# Patient Record
Sex: Male | Born: 1945
Health system: Southern US, Community
[De-identification: ages and names within clinical notes are randomized; demographics above are authoritative.]

## PROBLEM LIST (undated history)

## (undated) DIAGNOSIS — N289 Disorder of kidney and ureter, unspecified: Secondary | ICD-10-CM

## (undated) DIAGNOSIS — E119 Type 2 diabetes mellitus without complications: Secondary | ICD-10-CM

## (undated) DIAGNOSIS — M1990 Unspecified osteoarthritis, unspecified site: Secondary | ICD-10-CM

## (undated) DIAGNOSIS — I10 Essential (primary) hypertension: Secondary | ICD-10-CM

## (undated) DIAGNOSIS — I509 Heart failure, unspecified: Secondary | ICD-10-CM

## (undated) DIAGNOSIS — F0390 Unspecified dementia without behavioral disturbance: Secondary | ICD-10-CM

## (undated) HISTORY — PX: BACK SURGERY: SHX140

---

## 1965-05-30 HISTORY — PX: NEPHRECTOMY: SHX65

## 1998-02-17 ENCOUNTER — Encounter: Payer: Self-pay | Admitting: Family Medicine

## 1998-02-17 ENCOUNTER — Ambulatory Visit (HOSPITAL_COMMUNITY): Admission: RE | Admit: 1998-02-17 | Discharge: 1998-02-17 | Payer: Self-pay | Admitting: Family Medicine

## 1998-02-23 ENCOUNTER — Encounter: Payer: Self-pay | Admitting: Neurosurgery

## 1998-02-23 ENCOUNTER — Inpatient Hospital Stay (HOSPITAL_COMMUNITY): Admission: RE | Admit: 1998-02-23 | Discharge: 1998-02-24 | Payer: Self-pay | Admitting: Neurosurgery

## 1998-03-19 ENCOUNTER — Encounter: Payer: Self-pay | Admitting: Neurosurgery

## 1998-03-19 ENCOUNTER — Ambulatory Visit (HOSPITAL_COMMUNITY): Admission: RE | Admit: 1998-03-19 | Discharge: 1998-03-19 | Payer: Self-pay | Admitting: Neurosurgery

## 1999-11-01 ENCOUNTER — Encounter (INDEPENDENT_AMBULATORY_CARE_PROVIDER_SITE_OTHER): Payer: Self-pay | Admitting: Specialist

## 1999-11-01 ENCOUNTER — Ambulatory Visit (HOSPITAL_COMMUNITY): Admission: RE | Admit: 1999-11-01 | Discharge: 1999-11-01 | Payer: Self-pay | Admitting: Gastroenterology

## 2004-01-05 ENCOUNTER — Encounter (INDEPENDENT_AMBULATORY_CARE_PROVIDER_SITE_OTHER): Payer: Self-pay | Admitting: *Deleted

## 2004-01-05 ENCOUNTER — Ambulatory Visit (HOSPITAL_COMMUNITY): Admission: RE | Admit: 2004-01-05 | Discharge: 2004-01-05 | Payer: Self-pay | Admitting: Gastroenterology

## 2009-07-16 ENCOUNTER — Encounter (INDEPENDENT_AMBULATORY_CARE_PROVIDER_SITE_OTHER): Payer: Self-pay | Admitting: Internal Medicine

## 2009-07-16 ENCOUNTER — Observation Stay (HOSPITAL_COMMUNITY): Admission: EM | Admit: 2009-07-16 | Discharge: 2009-07-18 | Payer: Self-pay | Admitting: Emergency Medicine

## 2010-08-19 LAB — CBC
Hemoglobin: 11.6 g/dL — ABNORMAL LOW (ref 13.0–17.0)
Hemoglobin: 14 g/dL (ref 13.0–17.0)
MCHC: 32.9 g/dL (ref 30.0–36.0)
MCHC: 33.7 g/dL (ref 30.0–36.0)
Platelets: 198 10*3/uL (ref 150–400)
Platelets: 238 10*3/uL (ref 150–400)
RBC: 4.02 MIL/uL — ABNORMAL LOW (ref 4.22–5.81)
RDW: 13.9 % (ref 11.5–15.5)
RDW: 14.2 % (ref 11.5–15.5)

## 2010-08-19 LAB — COMPREHENSIVE METABOLIC PANEL
ALT: 18 U/L (ref 0–53)
AST: 21 U/L (ref 0–37)
Albumin: 3 g/dL — ABNORMAL LOW (ref 3.5–5.2)
Albumin: 3.8 g/dL (ref 3.5–5.2)
BUN: 25 mg/dL — ABNORMAL HIGH (ref 6–23)
BUN: 32 mg/dL — ABNORMAL HIGH (ref 6–23)
Calcium: 8 mg/dL — ABNORMAL LOW (ref 8.4–10.5)
Calcium: 9.4 mg/dL (ref 8.4–10.5)
Creatinine, Ser: 1.62 mg/dL — ABNORMAL HIGH (ref 0.4–1.5)
GFR calc Af Amer: 52 mL/min — ABNORMAL LOW (ref >60)
Glucose, Bld: 212 mg/dL — ABNORMAL HIGH (ref 70–99)
Sodium: 139 mEq/L (ref 135–145)
Total Bilirubin: 0.6 mg/dL (ref 0.3–1.2)
Total Protein: 5.6 g/dL — ABNORMAL LOW (ref 6.0–8.3)
Total Protein: 7.2 g/dL (ref 6.0–8.3)

## 2010-08-19 LAB — GLUCOSE, CAPILLARY
Glucose-Capillary: 104 mg/dL — ABNORMAL HIGH (ref 70–99)
Glucose-Capillary: 89 mg/dL (ref 70–99)

## 2010-08-19 LAB — DIFFERENTIAL
Lymphs Abs: 0.5 10*3/uL — ABNORMAL LOW (ref 0.7–4.0)
Monocytes Absolute: 0.9 10*3/uL (ref 0.1–1.0)
Monocytes Relative: 9 % (ref 3–12)
Neutro Abs: 8.8 10*3/uL — ABNORMAL HIGH (ref 1.7–7.7)
Neutrophils Relative %: 85 % — ABNORMAL HIGH (ref 43–77)

## 2010-08-19 LAB — HEMOGLOBIN A1C
Hgb A1c MFr Bld: 6.4 % — ABNORMAL HIGH (ref 4.6–6.1)
Mean Plasma Glucose: 137 mg/dL

## 2010-08-19 LAB — CK TOTAL AND CKMB (NOT AT ARMC)
CK, MB: 1.7 ng/mL (ref 0.3–4.0)
CK, MB: 2.2 ng/mL (ref 0.3–4.0)
Relative Index: 2 (ref 0.0–2.5)
Total CK: 109 U/L (ref 7–232)
Total CK: 94 U/L (ref 7–232)

## 2010-08-19 LAB — POCT I-STAT, CHEM 8
BUN: 34 mg/dL — ABNORMAL HIGH (ref 6–23)
Chloride: 111 mEq/L (ref 96–112)
Creatinine, Ser: 2.2 mg/dL — ABNORMAL HIGH (ref 0.4–1.5)
Sodium: 141 mEq/L (ref 135–145)

## 2010-08-19 LAB — URINALYSIS, ROUTINE W REFLEX MICROSCOPIC
Glucose, UA: NEGATIVE mg/dL
Hgb urine dipstick: NEGATIVE
Ketones, ur: NEGATIVE mg/dL
Leukocytes, UA: NEGATIVE
Nitrite: NEGATIVE
Protein, ur: 100 mg/dL — AB
Specific Gravity, Urine: 1.017 (ref 1.005–1.030)
Urobilinogen, UA: 0.2 mg/dL (ref 0.0–1.0)
pH: 5.5 (ref 5.0–8.0)

## 2010-08-19 LAB — CARDIAC PANEL(CRET KIN+CKTOT+MB+TROPI)
CK, MB: 1.3 ng/mL (ref 0.3–4.0)
Total CK: 92 U/L (ref 7–232)

## 2010-08-19 LAB — LACTIC ACID, PLASMA: Lactic Acid, Venous: 2.7 mmol/L — ABNORMAL HIGH (ref 0.5–2.2)

## 2010-08-19 LAB — URINE CULTURE: Colony Count: 10000

## 2010-08-19 LAB — BASIC METABOLIC PANEL
BUN: 16 mg/dL (ref 6–23)
BUN: 38 mg/dL — ABNORMAL HIGH (ref 6–23)
Calcium: 8.4 mg/dL (ref 8.4–10.5)
Creatinine, Ser: 1.34 mg/dL (ref 0.4–1.5)
GFR calc non Af Amer: 33 mL/min — ABNORMAL LOW (ref >60)
GFR calc non Af Amer: 54 mL/min — ABNORMAL LOW (ref >60)
Glucose, Bld: 109 mg/dL — ABNORMAL HIGH (ref 70–99)
Glucose, Bld: 129 mg/dL — ABNORMAL HIGH (ref 70–99)
Potassium: 4.1 mEq/L (ref 3.5–5.1)
Sodium: 140 mEq/L (ref 135–145)

## 2010-08-19 LAB — STOOL CULTURE

## 2010-08-19 LAB — CLOSTRIDIUM DIFFICILE EIA

## 2010-08-19 LAB — POCT CARDIAC MARKERS
CKMB, poc: 3 ng/mL (ref 1.0–8.0)
Myoglobin, poc: 459 ng/mL (ref 12–200)
Troponin i, poc: <0.05 ng/mL (ref 0.00–0.09)

## 2010-08-19 LAB — URINE MICROSCOPIC-ADD ON

## 2010-08-19 LAB — LIPID PANEL: HDL: 29 mg/dL — ABNORMAL LOW (ref >39)

## 2010-08-19 LAB — PROTIME-INR: INR: 0.98 (ref 0.00–1.49)

## 2010-10-15 NOTE — Procedures (Signed)
[place]  Patient:    Fernando [name], Fernando [name]                    MRN: [id number] Proc. Date: 11/01/99 Adm. Date:  [id number] Attending:  Nelda Marseille CC:         Desma Maxim, M.D.                           Procedure Report  PROCEDURE:  Colonoscopy with hot biopsy.  INDICATIONS:  A family history of colon cancer, due for colonic screening. Consent was signed after the risks, benefits, methods, and options were discussed before any sedation was given.  MEDICINES USED:  Demerol 100 mg and Versed 2.5 mg.  DESCRIPTION OF PROCEDURE:  Rectal inspection was pertinent for external hemorrhoids.  Digital exam was negative.  The video colonoscope was inserted and fairly easily advanced around the colon to the cecum.  This did require rolling him on his back and some abdominal pressure.  On insertion, no abnormalities were seen.  The cecum was identified by the appendiceal orifice and the ileocecal valve.  In fact, the scope was inserted a short way into the terminal ileum, which was normal.  Photo documentation was obtained.  The scope was slowly withdrawn.  Prep was adequate.  There was some liquid stool that required washing and suctioning.  On slow withdraw from the cecum, the ascending and transverse were normal.  In the descending, five tiny to small polyps were seen and were all hot biopsied x 1.  In the more distal sigmoid, another small polyp was seen and was hot biopsied x 2.  No other abnormalities were seen other than some left-sided diverticula as we withdrew back to the rectum.  Once back in the rectum, the scope was retroflexed, revealing some small internal hemorrhoids.  The scope was straightened and readvanced a short way up the sigmoid.  Air was suctioned and the scope was removed.  The patient tolerated the procedure well.  There were no obvious immediate complications.  ENDOSCOPIC DIAGNOSES: 1. Internal and external  hemorrhoids. 2. Multiple hot biopsies in the descending and one in the sigmoid of tiny to    small polyps. 3. Left-sided occasional diverticula. 4. Otherwise within normal limits to the terminal ileum.  PLAN: 1. Await pathology in term of future colonic screening. 2. Two weeks post polypectomy orders. 3. Follow up p.r.n. 4. Otherwise return care to Desma Maxim, M.D., for the customary yearly    rectals and guaiacs. 5. Happy to see back as above p.r.n. DD:  11/01/99 TD:  11/03/99 Job: [id number]

## 2010-10-15 NOTE — Op Note (Signed)
NAME:  Fernando [name], Fernando [name]                       ACCOUNT NO.:  [id number]   MEDICAL RECORD NO.:  [id number]                   PATIENT TYPE:  AMB   LOCATION:  ENDO                                 FACILITY:  [facility]   PHYSICIAN:  Petra Kuba, M.D.                 DATE OF BIRTH:  February 02, 1946   DATE OF PROCEDURE:  01/05/2004  DATE OF DISCHARGE:                                 OPERATIVE REPORT   PROCEDURE PERFORMED:  Colonoscopy.   ENDOSCOPIST:  Petra Kuba, M.D.   INDICATIONS FOR PROCEDURE:  Family history of colon cancer.  Personal  history of colon polyps.  Consent was signed after the risks, benefits,  methods and options were thoroughly discussed in the office in the past.   MEDICINES USED:  Demerol 75 mg, Versed 7.5 mg.   DESCRIPTION OF PROCEDURE:  Rectal inspection was pertinent for external  hemorrhoids, small.  Digital exam was negative.  A video colonoscope was  inserted and easily advanced to the level of the splenic flexure.  At that  point there was some looping.  With abdominal pressure and rolling him on  the back we were able to advance to the cecum, which was identified by the  appendiceal orifice and the ileocecal valve.  On insertion a mid descending  pedunculated polyp was seen.  Photodocumentation was obtained.  There was  also an occasional left-sided diverticulum seen.  The cecum was identified  by the appendiceal orifice and the ileocecal valve.  The scope was slowly  withdrawn.  The prep was adequate.  There was some liquid stool that  required washing and suctioning.  The ascending colon was normal.  In the  more proximal transverse, a small to medium size polyp along the fold was  seen, snared times two, electrocautery applied.  The polyps were suctioned  through the scope and collected in the trap.  Proximally, there was a little  bit of residual polypoid tissue which was hot biopsied times one.  This was  all put in the first container.  The scope was slowly  withdrawn. In the  proximal and then mid descending, two pedunculated polyps were seen, snared,  electrocautery applied and the polyp was suctioned through the scope and  collected in the trap. One of the polyps seemed to have a little bit of  residual polypoid tissue and a second snare was done and again, both of  these polyps were put in separate containers.  The first one seemed to  fragment but most if not all pieces were collected.  There was a nice white  coagulum after both snares were done without any obvious residual polypoid  tissue.  The scope was then further withdrawn.  Other than the occasional  left-sided diverticula, no additional findings were seen.  Once back in the  rectum, anorectal pullthrough and retroflexion confirmed the hemorrhoids one  with a little bit of erythema and  some heme, probably from scope trauma.  The scope was reinserted a short ways up the left side of the colon.  Air  was suctioned, scope removed.  The patient tolerated the procedure well.  There was no immediate obvious complication.   ENDOSCOPIC DIAGNOSES:  1. Internal and external hemorrhoids.  2. Left occasional diverticula.  3. Two descending pedunculated polyps snared.  4. Transverse distal polyp on a fold with two snares and one hot biopsy.  5. Otherwise within normal limits to the cecum.   PLAN:  Await pathology but based on these being fairly fast growing,  probably need to be recolonoscoped sooner than the four years that we waited  between the last ones. Will also need to sent a copy to [facility] per his  request.  Otherwise return care to the [place] and Donia Guiles, M.D. for the  customary health care maintenance to include yearly rectals and guaiacs.                                               Petra Kuba, M.D.    MEM/MEDQ  D:  01/05/2004  T:  01/05/2004  Job:  [id number]   cc:   Donia Guiles, M.D.  [address] [place]  [place] [address]  Fax: [phone]

## 2010-10-18 ENCOUNTER — Other Ambulatory Visit: Payer: Self-pay | Admitting: Gastroenterology

## 2012-01-28 ENCOUNTER — Ambulatory Visit (INDEPENDENT_AMBULATORY_CARE_PROVIDER_SITE_OTHER): Payer: Medicare Other | Admitting: Family Medicine

## 2012-01-28 VITALS — BP 151/74 | HR 77 | Temp 98.4°F | Resp 16 | Ht 69.58 in | Wt 270.0 lb

## 2012-01-28 DIAGNOSIS — H6090 Unspecified otitis externa, unspecified ear: Secondary | ICD-10-CM

## 2012-01-28 DIAGNOSIS — H60339 Swimmer's ear, unspecified ear: Secondary | ICD-10-CM

## 2012-01-28 MED ORDER — CIPROFLOXACIN-HYDROCORTISONE 0.2-1 % OT SUSP: 3.0000 [drp] | Freq: Two times a day (BID) | OTIC | Status: AC

## 2012-01-28 MED ORDER — CIPROFLOXACIN HCL 500 MG PO TABS: 500.0000 mg | ORAL_TABLET | Freq: Two times a day (BID) | ORAL | Status: AC

## 2012-01-28 NOTE — Progress Notes (Signed)
  Subjective:    Patient ID: Fernando [name], male    DOB: [birth year], 66 y.o.   MRN: [id number]  HPI right ear pain a couple months ago, used cortisporin drops with some relief, but pain has returned for two days. He does use hearing aids. Pain worse yesterday and today.   Review of Systems     Objective:   Physical Exam Right ear otitis externa: wet yellow exudates with canal swelling.  Tender with pinna manipulation    Assessment & Plan:  Use Cipro 500 bid for 7 days and use Cipro otic drops. Patient also instructed to clean his hearing aids when removing them, and before he places them back in.

## 2012-01-28 NOTE — Patient Instructions (Addendum)
Otitis Externa  Otitis externa ("swimmer's ear") is a germ (bacterial) or fungal infection of the outer ear canal (from the eardrum to the outside of the ear). Swimming in dirty water may cause swimmer's ear. It also may be caused by moisture in the ear from water remaining after swimming or bathing. Often the first signs of infection may be itching in the ear canal. This may progress to ear canal swelling, redness, and pus drainage, which may be signs of infection.  HOME CARE INSTRUCTIONS    Apply the antibiotic drops to the ear canal as prescribed by your doctor.   This can be a very painful medical condition. A strong pain reliever may be prescribed.   Only take over-the-counter or prescription medicines for pain, discomfort, or fever as directed by your caregiver.   If your caregiver has given you a follow-up appointment, it is very important to keep that appointment. Not keeping the appointment could result in a chronic or permanent injury, pain, hearing loss and disability. If there is any problem keeping the appointment, you must call back to this facility for assistance.  PREVENTION    It is important to keep your ear dry. Use the corner of a towel to wick water out of the ear canal after swimming or bathing.   Avoid scratching in your ear. This can damage the ear canal or remove the protective wax lining the canal and make it easier for germs (bacteria) or a fungus to grow.   You may use ear drops made of rubbing alcohol and vinegar after swimming to prevent future "swimmer's ear" infections. Make up a small bottle of equal parts white vinegar and alcohol. Put 3 or 4 drops into each ear after swimming.   Avoid swimming in lakes, polluted water, or poorly chlorinated pools.  SEEK MEDICAL CARE IF:    An oral temperature above 102 F (38.9 C) develops.   Your ear is still painful after 3 days and shows signs of getting worse (redness, swelling, pain, or pus).  MAKE SURE YOU:    Understand these  instructions.   Will watch your condition.   Will get help right away if you are not doing well or get worse.  Document Released: 05/16/2005 Document Revised: 05/05/2011 Document Reviewed: 12/21/2007  [employer] Patient Information 2012 [employer].

## 2013-09-04 ENCOUNTER — Other Ambulatory Visit: Payer: Self-pay | Admitting: Gastroenterology

## 2016-01-29 ENCOUNTER — Inpatient Hospital Stay (HOSPITAL_COMMUNITY)
Admission: EM | Admit: 2016-01-29 | Discharge: 2016-02-01 | DRG: 683 | Disposition: A | Discharge status: Home / Self Care | Payer: Medicare Other | Attending: Family Medicine | Admitting: Family Medicine

## 2016-01-29 ENCOUNTER — Encounter (HOSPITAL_COMMUNITY): Payer: Self-pay | Admitting: Emergency Medicine

## 2016-01-29 ENCOUNTER — Emergency Department (HOSPITAL_COMMUNITY): Payer: Medicare Other

## 2016-01-29 DIAGNOSIS — R0902 Hypoxemia: Secondary | ICD-10-CM | POA: Diagnosis present

## 2016-01-29 DIAGNOSIS — E119 Type 2 diabetes mellitus without complications: Secondary | ICD-10-CM | POA: Diagnosis not present

## 2016-01-29 DIAGNOSIS — N189 Chronic kidney disease, unspecified: Secondary | ICD-10-CM | POA: Diagnosis present

## 2016-01-29 DIAGNOSIS — E669 Obesity, unspecified: Secondary | ICD-10-CM | POA: Diagnosis not present

## 2016-01-29 DIAGNOSIS — R600 Localized edema: Secondary | ICD-10-CM | POA: Diagnosis not present

## 2016-01-29 DIAGNOSIS — F0390 Unspecified dementia without behavioral disturbance: Secondary | ICD-10-CM | POA: Diagnosis present

## 2016-01-29 DIAGNOSIS — R0600 Dyspnea, unspecified: Secondary | ICD-10-CM | POA: Diagnosis not present

## 2016-01-29 DIAGNOSIS — N179 Acute kidney failure, unspecified: Secondary | ICD-10-CM | POA: Diagnosis not present

## 2016-01-29 DIAGNOSIS — R000 Tachycardia, unspecified: Secondary | ICD-10-CM | POA: Diagnosis present

## 2016-01-29 DIAGNOSIS — I509 Heart failure, unspecified: Secondary | ICD-10-CM | POA: Diagnosis not present

## 2016-01-29 DIAGNOSIS — N289 Disorder of kidney and ureter, unspecified: Secondary | ICD-10-CM

## 2016-01-29 DIAGNOSIS — Z79899 Other long term (current) drug therapy: Secondary | ICD-10-CM

## 2016-01-29 DIAGNOSIS — R609 Edema, unspecified: Secondary | ICD-10-CM | POA: Diagnosis not present

## 2016-01-29 DIAGNOSIS — Z7982 Long term (current) use of aspirin: Secondary | ICD-10-CM

## 2016-01-29 DIAGNOSIS — Z905 Acquired absence of kidney: Secondary | ICD-10-CM

## 2016-01-29 DIAGNOSIS — E1122 Type 2 diabetes mellitus with diabetic chronic kidney disease: Secondary | ICD-10-CM | POA: Diagnosis present

## 2016-01-29 DIAGNOSIS — R0989 Other specified symptoms and signs involving the circulatory and respiratory systems: Secondary | ICD-10-CM | POA: Diagnosis not present

## 2016-01-29 DIAGNOSIS — I959 Hypotension, unspecified: Secondary | ICD-10-CM | POA: Diagnosis not present

## 2016-01-29 DIAGNOSIS — M79609 Pain in unspecified limb: Secondary | ICD-10-CM | POA: Diagnosis not present

## 2016-01-29 DIAGNOSIS — R0602 Shortness of breath: Secondary | ICD-10-CM

## 2016-01-29 DIAGNOSIS — M79605 Pain in left leg: Secondary | ICD-10-CM | POA: Diagnosis not present

## 2016-01-29 DIAGNOSIS — M25462 Effusion, left knee: Secondary | ICD-10-CM | POA: Diagnosis not present

## 2016-01-29 DIAGNOSIS — M25562 Pain in left knee: Secondary | ICD-10-CM

## 2016-01-29 DIAGNOSIS — I130 Hypertensive heart and chronic kidney disease with heart failure and stage 1 through stage 4 chronic kidney disease, or unspecified chronic kidney disease: Secondary | ICD-10-CM | POA: Diagnosis present

## 2016-01-29 DIAGNOSIS — Z87891 Personal history of nicotine dependence: Secondary | ICD-10-CM

## 2016-01-29 DIAGNOSIS — I10 Essential (primary) hypertension: Secondary | ICD-10-CM

## 2016-01-29 HISTORY — DX: Type 2 diabetes mellitus without complications: E11.9

## 2016-01-29 HISTORY — DX: Essential (primary) hypertension: I10

## 2016-01-29 HISTORY — DX: Unspecified osteoarthritis, unspecified site: M19.90

## 2016-01-29 HISTORY — DX: Disorder of kidney and ureter, unspecified: N28.9

## 2016-01-29 LAB — COMPREHENSIVE METABOLIC PANEL
ALT: 11 U/L — AB (ref 17–63)
AST: 12 U/L — ABNORMAL LOW (ref 15–41)
Albumin: 3.5 g/dL (ref 3.5–5.0)
Alkaline Phosphatase: 42 U/L (ref 38–126)
Anion gap: 8 (ref 5–15)
BUN: 72 mg/dL — ABNORMAL HIGH (ref 6–20)
CALCIUM: 9.3 mg/dL (ref 8.9–10.3)
CHLORIDE: 100 mmol/L — AB (ref 101–111)
CO2: 31 mmol/L (ref 22–32)
CREATININE: 3.77 mg/dL — AB (ref 0.61–1.24)
GFR, EST AFRICAN AMERICAN: 17 mL/min — AB (ref >60)
GFR, EST NON AFRICAN AMERICAN: 15 mL/min — AB (ref >60)
Glucose, Bld: 111 mg/dL — ABNORMAL HIGH (ref 65–99)
Potassium: 4.2 mmol/L (ref 3.5–5.1)
Sodium: 139 mmol/L (ref 135–145)
Total Bilirubin: 0.8 mg/dL (ref 0.3–1.2)
Total Protein: 7.4 g/dL (ref 6.5–8.1)

## 2016-01-29 LAB — BRAIN NATRIURETIC PEPTIDE: B NATRIURETIC PEPTIDE 5: 91.2 pg/mL (ref 0.0–100.0)

## 2016-01-29 LAB — CBC WITH DIFFERENTIAL/PLATELET
Basophils Absolute: 0 10*3/uL (ref 0.0–0.1)
Basophils Relative: 0 %
EOS PCT: 1 %
Eosinophils Absolute: 0.1 10*3/uL (ref 0.0–0.7)
HCT: 37.4 % — ABNORMAL LOW (ref 39.0–52.0)
Hemoglobin: 11.7 g/dL — ABNORMAL LOW (ref 13.0–17.0)
LYMPHS ABS: 1.5 10*3/uL (ref 0.7–4.0)
LYMPHS PCT: 15 %
MCH: 27.7 pg (ref 26.0–34.0)
MCHC: 31.3 g/dL (ref 30.0–36.0)
MCV: 88.4 fL (ref 78.0–100.0)
MONO ABS: 1.3 10*3/uL — AB (ref 0.1–1.0)
Monocytes Relative: 13 %
Neutro Abs: 7.2 10*3/uL (ref 1.7–7.7)
Neutrophils Relative %: 71 %
PLATELETS: 223 10*3/uL (ref 150–400)
RBC: 4.23 MIL/uL (ref 4.22–5.81)
RDW: 15.1 % (ref 11.5–15.5)
WBC: 10 10*3/uL (ref 4.0–10.5)

## 2016-01-29 LAB — I-STAT TROPONIN, ED: TROPONIN I, POC: 0.01 ng/mL (ref 0.00–0.08)

## 2016-01-29 LAB — I-STAT CG4 LACTIC ACID, ED: Lactic Acid, Venous: 1.67 mmol/L (ref 0.5–1.9)

## 2016-01-29 MED ORDER — SODIUM CHLORIDE 0.9 % IV BOLUS (SEPSIS)
500.0000 mL | Freq: Once | INTRAVENOUS | Status: AC
  Administered 2016-01-29: 500 mL via INTRAVENOUS

## 2016-01-29 MED ORDER — SODIUM CHLORIDE 0.9 % IV BOLUS (SEPSIS): 250.0000 mL | Freq: Once | INTRAVENOUS | Status: DC

## 2016-01-29 NOTE — ED Provider Notes (Signed)
[facility]-EMERGENCY DEPT Provider Note   CSN: [id number] Arrival date & time: 01/29/16  1750     History   Chief Complaint Chief Complaint  Patient presents with  . Knee Pain  . Ankle Pain    HPI Fernando [name] is a 70 y.o. male.  HPI Patient presents with left leg pain. Worse over the last few days. States the pain is from the knee all the way to his left foot. No recent fall. Patient had increased generalized weakness and difficulty ambulating recently. Admits to some shortness of breath and mild nonproductive cough. No fever or chills. Patient's had steroid injections in the left knee before. Difficult historian. Past Medical History:  Diagnosis Date  . Arthritis   . Diabetes mellitus without complication (HCC)   . Hypertension   . Renal disorder     There are no active problems to display for this patient.   Past Surgical History:  Procedure Laterality Date  . BACK SURGERY    . NEPHRECTOMY Left 1967       Home Medications    Prior to Admission medications   Medication Sig Start Date End Date Taking? Authorizing Provider  aspirin EC 81 MG tablet Take 81 mg by mouth every other day. Sunday, Tuesday, Thursday, and Saturday.   Yes Historical Provider, MD  carvedilol (COREG) 6.25 MG tablet Take 6.25 mg by mouth 2 (two) times daily with a meal.   Yes Historical Provider, MD  cholecalciferol (VITAMIN D) 1000 units tablet Take 1,000 Units by mouth daily.   Yes Historical Provider, MD  docusate sodium (COLACE) 50 MG capsule Take 50 mg by mouth 2 (two) times daily.   Yes Historical Provider, MD  donepezil (ARICEPT) 10 MG tablet Take 5 mg by mouth every morning.   Yes Historical Provider, MD  finasteride (PROSCAR) 5 MG tablet Take 5 mg by mouth daily.   Yes Historical Provider, MD  glipiZIDE (GLUCOTROL) 5 MG tablet Take 5 mg by mouth 2 (two) times daily before a meal.   Yes Historical Provider, MD  losartan (COZAAR) 50 MG tablet Take 50 mg by mouth daily.   Yes Historical  Provider, MD  magnesium oxide (MAG-OX) 400 MG tablet Take 400 mg by mouth 3 (three) times a week. Monday, Wednesday, and Friday.   Yes Historical Provider, MD  simvastatin (ZOCOR) 80 MG tablet Take 40 mg by mouth 3 (three) times a week. Monday, Wednesday, and Friday.   Yes Historical Provider, MD  spironolactone (ALDACTONE) 25 MG tablet Take 25 mg by mouth every morning.   Yes Historical Provider, MD  terazosin (HYTRIN) 5 MG capsule Take 5 mg by mouth at bedtime.   Yes Historical Provider, MD  torsemide (DEMADEX) 20 MG tablet Take 20 mg by mouth 2 (two) times daily.   Yes Historical Provider, MD  vitamin B-12 (CYANOCOBALAMIN) 1000 MCG tablet Take 1,000 mcg by mouth daily.   Yes Historical Provider, MD    Family History No family history on file.  Social History Social History  Substance Use Topics  . Smoking status: Former Smoker    Quit date: 05/30/1970  . Smokeless tobacco: Never Used  . Alcohol use No     Allergies   Review of patient's allergies indicates no known allergies.   Review of Systems Review of Systems  Constitutional: Positive for fatigue. Negative for chills and fever.  Respiratory: Positive for cough and shortness of breath. Negative for wheezing.   Cardiovascular: Positive for leg swelling. Negative for chest pain  and palpitations.  Gastrointestinal: Negative for abdominal pain, constipation, diarrhea, nausea and vomiting.  Genitourinary: Negative for dysuria, flank pain, frequency and hematuria.  Musculoskeletal: Positive for arthralgias and myalgias. Negative for joint swelling.  Skin: Negative for rash and wound.  Neurological: Positive for weakness (generalized). Negative for dizziness, light-headedness, numbness and headaches.  Psychiatric/Behavioral: Positive for confusion.  All other systems reviewed and are negative.    Physical Exam Updated Vital Signs BP (!) 112/46   Pulse (!) 43   Temp 99.5 F (37.5 C) (Oral)   Resp 20   Ht 6' (1.829 m)   Wt  280 lb (127 kg)   SpO2 92%   BMI 37.97 kg/m   Physical Exam  Constitutional: He is oriented to person, place, and time. He appears well-developed and well-nourished. No distress.  HENT:  Head: Normocephalic and atraumatic.  Mouth/Throat: Oropharynx is clear and moist.  Eyes: EOM are normal. Pupils are equal, round, and reactive to light.  Neck: Normal range of motion. Neck supple. No JVD present.  Cardiovascular: Normal rate and regular rhythm.  Exam reveals no gallop and no friction rub.   No murmur heard. Pulmonary/Chest: Effort normal and breath sounds normal. No respiratory distress. He has no wheezes. He has no rales. He exhibits no tenderness.  Crackles in bilateral bases  Abdominal: Soft. Bowel sounds are normal. There is no tenderness. There is no rebound and no guarding.  Musculoskeletal: Normal range of motion. He exhibits edema. He exhibits no tenderness.  3+ bilateral lower extremity pitting edema. Patient has erythema from the mid tibia distally in bilateral legs. Mild knee pain with range of motion. No ligamentous instability. No erythema, warmth or joint effusion.  Neurological: He is alert and oriented to person, place, and time.  Moves all extremities without focal deficit. Sensation intact.Marland Kitchen Appears mildly confused  Skin: Skin is warm and dry. Capillary refill takes less than 2 seconds. No rash noted. He is not diaphoretic. There is erythema.  Psychiatric: He has a normal mood and affect. His behavior is normal.  Nursing note and vitals reviewed.    ED Treatments / Results  Labs (all labs ordered are listed, but only abnormal results are displayed) Labs Reviewed  CBC WITH DIFFERENTIAL/PLATELET - Abnormal; Notable for the following:       Result Value   Hemoglobin 11.7 (*)    HCT 37.4 (*)    Monocytes Absolute 1.3 (*)    All other components within normal limits  COMPREHENSIVE METABOLIC PANEL - Abnormal; Notable for the following:    Chloride 100 (*)     Glucose, Bld 111 (*)    BUN 72 (*)    Creatinine, Ser 3.77 (*)    AST 12 (*)    ALT 11 (*)    GFR calc non Af Amer 15 (*)    GFR calc Af Amer 17 (*)    All other components within normal limits  CULTURE, BLOOD (ROUTINE X 2)  CULTURE, BLOOD (ROUTINE X 2)  URINE CULTURE  BRAIN NATRIURETIC PEPTIDE  URINALYSIS, ROUTINE W REFLEX MICROSCOPIC (NOT AT [facility])  I-STAT CG4 LACTIC ACID, ED  I-STAT TROPOININ, ED    EKG  EKG Interpretation  Date/Time:  Friday January 29 2016 21:56:27 EDT Ventricular Rate:  85 PR Interval:    QRS Duration: 117 QT Interval:  363 QTC Calculation: 432 R Axis:   76 Text Interpretation:  Sinus rhythm Ventricular bigeminy Incomplete right bundle branch block Low voltage, precordial leads Confirmed by Ranae Palms  MD,  Julyssa Kyer ([id number]) on 01/29/2016 11:31:45 PM       Radiology Dg Chest 1 View  Result Date: 01/29/2016 CLINICAL DATA:  Acute onset of left knee pain and left lower extremity swelling and erythema. Initial encounter. EXAM: CHEST 1 VIEW COMPARISON:  Chest radiograph performed 07/15/2009 FINDINGS: The lungs are well-aerated. Mild vascular congestion is noted. Mild left basilar opacity may reflect atelectasis. No definite pleural effusion or pneumothorax is seen. The cardiomediastinal silhouette is mildly enlarged. No acute osseous abnormalities are seen. IMPRESSION: Mild vascular congestion and mild cardiomegaly. Left basilar opacity may reflect atelectasis. Electronically Signed   By: Roanna RaiderJeffery  Chang M.D.   On: 01/29/2016 21:44   Dg Knee Complete 4 Views Left  Result Date: 01/29/2016 CLINICAL DATA:  Acute onset of left knee pain.  Initial encounter. EXAM: LEFT KNEE - COMPLETE 4+ VIEW COMPARISON:  None. FINDINGS: There is no evidence of fracture or dislocation. The joint spaces are preserved. No significant degenerative change is seen; the patellofemoral joint is grossly unremarkable in appearance. A small knee joint effusion is seen. Mild chondrocalcinosis is noted.  IMPRESSION: 1. No evidence of fracture or dislocation. 2. Small knee joint effusion noted. 3. Mild chondrocalcinosis noted. Electronically Signed   By: Roanna RaiderJeffery  Chang M.D.   On: 01/29/2016 21:43    Procedures Procedures (including critical care time)  Medications Ordered in ED Medications  sodium chloride 0.9 % bolus 500 mL (not administered)     Initial Impression / Assessment and Plan / ED Course  I have reviewed the triage vital signs and the nursing notes.  Pertinent labs & imaging results that were available during my care of the patient were reviewed by me and considered in my medical decision making (see chart for details).  Clinical Course  Patient with elevation in creatinine. Unsure as to his baseline. Normal BNP. Concern for probable intravascular dehydration. Give small bolus of IV fluids in the emergency department. Discuss with Dr. Montez Moritaarter who will see the patient and admit.   Final Clinical Impressions(s) / ED Diagnoses   Final diagnoses:  Acute kidney injury (HCC)  Left knee pain  Peripheral edema    New Prescriptions New Prescriptions   No medications on file     Loren Raceravid Felita Bump, MD 01/29/16 2333

## 2016-01-29 NOTE — ED Triage Notes (Signed)
Pt with bilateral knee and left ankle pain with increased difficulty ambulating. Denies injury or trauma. Reports having [facility].

## 2016-01-29 NOTE — ED Notes (Signed)
Hospitalist at bedside 

## 2016-01-30 ENCOUNTER — Inpatient Hospital Stay (HOSPITAL_COMMUNITY): Payer: Medicare Other

## 2016-01-30 ENCOUNTER — Encounter (HOSPITAL_COMMUNITY): Payer: Self-pay | Admitting: *Deleted

## 2016-01-30 DIAGNOSIS — M79609 Pain in unspecified limb: Secondary | ICD-10-CM

## 2016-01-30 DIAGNOSIS — E119 Type 2 diabetes mellitus without complications: Secondary | ICD-10-CM

## 2016-01-30 DIAGNOSIS — N189 Chronic kidney disease, unspecified: Secondary | ICD-10-CM | POA: Diagnosis present

## 2016-01-30 DIAGNOSIS — E1122 Type 2 diabetes mellitus with diabetic chronic kidney disease: Secondary | ICD-10-CM | POA: Diagnosis present

## 2016-01-30 DIAGNOSIS — R0902 Hypoxemia: Secondary | ICD-10-CM | POA: Diagnosis present

## 2016-01-30 DIAGNOSIS — R000 Tachycardia, unspecified: Secondary | ICD-10-CM | POA: Diagnosis present

## 2016-01-30 DIAGNOSIS — R0600 Dyspnea, unspecified: Secondary | ICD-10-CM

## 2016-01-30 DIAGNOSIS — F0390 Unspecified dementia without behavioral disturbance: Secondary | ICD-10-CM | POA: Diagnosis present

## 2016-01-30 DIAGNOSIS — Z905 Acquired absence of kidney: Secondary | ICD-10-CM

## 2016-01-30 DIAGNOSIS — I959 Hypotension, unspecified: Secondary | ICD-10-CM

## 2016-01-30 DIAGNOSIS — E669 Obesity, unspecified: Secondary | ICD-10-CM | POA: Diagnosis present

## 2016-01-30 DIAGNOSIS — R609 Edema, unspecified: Secondary | ICD-10-CM | POA: Diagnosis not present

## 2016-01-30 DIAGNOSIS — M79605 Pain in left leg: Secondary | ICD-10-CM | POA: Diagnosis present

## 2016-01-30 DIAGNOSIS — I509 Heart failure, unspecified: Secondary | ICD-10-CM

## 2016-01-30 DIAGNOSIS — N179 Acute kidney failure, unspecified: Secondary | ICD-10-CM | POA: Diagnosis not present

## 2016-01-30 DIAGNOSIS — I130 Hypertensive heart and chronic kidney disease with heart failure and stage 1 through stage 4 chronic kidney disease, or unspecified chronic kidney disease: Secondary | ICD-10-CM | POA: Diagnosis present

## 2016-01-30 DIAGNOSIS — Z7982 Long term (current) use of aspirin: Secondary | ICD-10-CM | POA: Diagnosis not present

## 2016-01-30 DIAGNOSIS — Z87891 Personal history of nicotine dependence: Secondary | ICD-10-CM | POA: Diagnosis not present

## 2016-01-30 DIAGNOSIS — Z79899 Other long term (current) drug therapy: Secondary | ICD-10-CM | POA: Diagnosis not present

## 2016-01-30 LAB — URINE MICROSCOPIC-ADD ON: RBC / HPF: NONE SEEN RBC/hpf (ref 0–5)

## 2016-01-30 LAB — SEDIMENTATION RATE: SED RATE: 53 mm/h — AB (ref 0–16)

## 2016-01-30 LAB — URINALYSIS, ROUTINE W REFLEX MICROSCOPIC
GLUCOSE, UA: NEGATIVE mg/dL
HGB URINE DIPSTICK: NEGATIVE
Ketones, ur: NEGATIVE mg/dL
Leukocytes, UA: NEGATIVE
Nitrite: NEGATIVE
Protein, ur: 30 mg/dL — AB
SPECIFIC GRAVITY, URINE: 1.014 (ref 1.005–1.030)
pH: 5 (ref 5.0–8.0)

## 2016-01-30 LAB — BRAIN NATRIURETIC PEPTIDE: B Natriuretic Peptide: 130.4 pg/mL — ABNORMAL HIGH (ref 0.0–100.0)

## 2016-01-30 LAB — CBC
HCT: 34.6 % — ABNORMAL LOW (ref 39.0–52.0)
HEMOGLOBIN: 11 g/dL — AB (ref 13.0–17.0)
MCH: 27.2 pg (ref 26.0–34.0)
MCHC: 31.8 g/dL (ref 30.0–36.0)
MCV: 85.6 fL (ref 78.0–100.0)
PLATELETS: 200 10*3/uL (ref 150–400)
RBC: 4.04 MIL/uL — AB (ref 4.22–5.81)
RDW: 14.8 % (ref 11.5–15.5)
WBC: 8.7 10*3/uL (ref 4.0–10.5)

## 2016-01-30 LAB — BASIC METABOLIC PANEL
ANION GAP: 10 (ref 5–15)
BUN: 69 mg/dL — ABNORMAL HIGH (ref 6–20)
CHLORIDE: 101 mmol/L (ref 101–111)
CO2: 28 mmol/L (ref 22–32)
Calcium: 8.9 mg/dL (ref 8.9–10.3)
Creatinine, Ser: 3.57 mg/dL — ABNORMAL HIGH (ref 0.61–1.24)
GFR calc Af Amer: 18 mL/min — ABNORMAL LOW (ref >60)
GFR, EST NON AFRICAN AMERICAN: 16 mL/min — AB (ref >60)
Glucose, Bld: 99 mg/dL (ref 65–99)
POTASSIUM: 3.8 mmol/L (ref 3.5–5.1)
SODIUM: 139 mmol/L (ref 135–145)

## 2016-01-30 LAB — CK: Total CK: 86 U/L (ref 49–397)

## 2016-01-30 LAB — GLUCOSE, CAPILLARY
GLUCOSE-CAPILLARY: 134 mg/dL — AB (ref 65–99)
Glucose-Capillary: 236 mg/dL — ABNORMAL HIGH (ref 65–99)
Glucose-Capillary: 184 mg/dL — ABNORMAL HIGH (ref 65–99)

## 2016-01-30 LAB — C-REACTIVE PROTEIN: CRP: 11.2 mg/dL — AB (ref <1.0)

## 2016-01-30 LAB — URIC ACID: Uric Acid, Serum: 13.7 mg/dL — ABNORMAL HIGH (ref 4.4–7.6)

## 2016-01-30 LAB — CREATININE, URINE, RANDOM: CREATININE, URINE: 204.14 mg/dL

## 2016-01-30 LAB — TROPONIN I
TROPONIN I: 0.07 ng/mL — AB (ref <0.03)
TROPONIN I: 0.06 ng/mL — AB (ref <0.03)
Troponin I: 0.07 ng/mL (ref <0.03)

## 2016-01-30 LAB — TSH: TSH: 1.2 u[IU]/mL (ref 0.350–4.500)

## 2016-01-30 LAB — SODIUM, URINE, RANDOM: SODIUM UR: 24 mmol/L

## 2016-01-30 LAB — ECHOCARDIOGRAM COMPLETE
HEIGHTINCHES: 72 in
WEIGHTICAEL: 4719.61 [oz_av]

## 2016-01-30 LAB — T4, FREE: Free T4: 0.97 ng/dL (ref 0.61–1.12)

## 2016-01-30 MED ORDER — ENOXAPARIN SODIUM 30 MG/0.3ML ~~LOC~~ SOLN
30.0000 mg | SUBCUTANEOUS | Status: DC
  Administered 2016-01-30 – 2016-01-31 (×2): 30 mg via SUBCUTANEOUS
  Filled 2016-01-30 (×2): qty 0.3

## 2016-01-30 MED ORDER — ACETAMINOPHEN 325 MG PO TABS: 650.0000 mg | ORAL_TABLET | Freq: Four times a day (QID) | ORAL | Status: DC | PRN

## 2016-01-30 MED ORDER — PREDNISONE 20 MG PO TABS
40.0000 mg | ORAL_TABLET | Freq: Every day | ORAL | Status: DC
  Administered 2016-01-30 – 2016-02-01 (×3): 40 mg via ORAL
  Filled 2016-01-30 (×3): qty 2

## 2016-01-30 MED ORDER — CARVEDILOL 6.25 MG PO TABS
6.2500 mg | ORAL_TABLET | Freq: Two times a day (BID) | ORAL | Status: DC
  Administered 2016-01-30 – 2016-02-01 (×5): 6.25 mg via ORAL
  Filled 2016-01-30 (×5): qty 1

## 2016-01-30 MED ORDER — ONDANSETRON HCL 4 MG/2ML IJ SOLN: 4.0000 mg | Freq: Four times a day (QID) | INTRAMUSCULAR | Status: DC | PRN

## 2016-01-30 MED ORDER — MAGNESIUM OXIDE 400 (241.3 MG) MG PO TABS
400.0000 mg | ORAL_TABLET | ORAL | Status: DC
Start: 2016-02-01 — End: 2016-02-01
  Administered 2016-02-01: 400 mg via ORAL
  Filled 2016-01-30: qty 1

## 2016-01-30 MED ORDER — SODIUM CHLORIDE 0.9 % IV SOLN: INTRAVENOUS | Status: AC

## 2016-01-30 MED ORDER — HYDROCODONE-ACETAMINOPHEN 5-325 MG PO TABS: 1.0000 | ORAL_TABLET | ORAL | Status: DC | PRN

## 2016-01-30 MED ORDER — ASPIRIN EC 81 MG PO TBEC
81.0000 mg | DELAYED_RELEASE_TABLET | ORAL | Status: DC
  Administered 2016-01-30 – 2016-02-01 (×2): 81 mg via ORAL
  Filled 2016-01-30 (×3): qty 1

## 2016-01-30 MED ORDER — ONDANSETRON HCL 4 MG PO TABS: 4.0000 mg | ORAL_TABLET | Freq: Four times a day (QID) | ORAL | Status: DC | PRN

## 2016-01-30 MED ORDER — DONEPEZIL HCL 10 MG PO TABS
5.0000 mg | ORAL_TABLET | Freq: Every morning | ORAL | Status: DC
  Administered 2016-01-30 – 2016-02-01 (×3): 5 mg via ORAL
  Filled 2016-01-30 (×3): qty 1

## 2016-01-30 MED ORDER — FINASTERIDE 5 MG PO TABS
5.0000 mg | ORAL_TABLET | Freq: Every day | ORAL | Status: DC
  Administered 2016-01-30 – 2016-02-01 (×3): 5 mg via ORAL
  Filled 2016-01-30 (×3): qty 1

## 2016-01-30 MED ORDER — ATORVASTATIN CALCIUM 40 MG PO TABS
40.0000 mg | ORAL_TABLET | Freq: Every day | ORAL | Status: DC
  Administered 2016-01-30 – 2016-01-31 (×2): 40 mg via ORAL
  Filled 2016-01-30 (×2): qty 1

## 2016-01-30 MED ORDER — INSULIN ASPART 100 UNIT/ML ~~LOC~~ SOLN
0.0000 [IU] | Freq: Three times a day (TID) | SUBCUTANEOUS | Status: DC
  Administered 2016-01-30: 1 [IU] via SUBCUTANEOUS
  Administered 2016-01-30: 3 [IU] via SUBCUTANEOUS
  Administered 2016-01-31: 1 [IU] via SUBCUTANEOUS
  Administered 2016-01-31: 3 [IU] via SUBCUTANEOUS
  Administered 2016-02-01: 1 [IU] via SUBCUTANEOUS

## 2016-01-30 MED ORDER — DOCUSATE SODIUM 50 MG PO CAPS
50.0000 mg | ORAL_CAPSULE | Freq: Two times a day (BID) | ORAL | Status: DC
  Administered 2016-01-30 – 2016-02-01 (×5): 50 mg via ORAL
  Filled 2016-01-30 (×5): qty 1

## 2016-01-30 MED ORDER — ACETAMINOPHEN 650 MG RE SUPP: 650.0000 mg | Freq: Four times a day (QID) | RECTAL | Status: DC | PRN

## 2016-01-30 MED ORDER — SODIUM CHLORIDE 0.9% FLUSH
3.0000 mL | Freq: Two times a day (BID) | INTRAVENOUS | Status: DC
  Administered 2016-01-31 – 2016-02-01 (×2): 3 mL via INTRAVENOUS

## 2016-01-30 NOTE — Progress Notes (Signed)
Patient seen and evaluated earlier this a.m. but my associate. Please refer to H&P for details regarding assessment and plan.  At this moment favor gentle fluid hydration given elevated BUN and creatinine. Of note patient however only has one kidney but has had a serum creatinine listed of around 1.17 in 2011. Agree with holding ARB. And plan will be to reassess serum creatinine next a.m. He reports the pain from his knees is improved. Will consult pharmacy for administration of colchicine for appropriate renal dose given his particular situation. He still reports pain at left great toe  Otherwise awaiting echocardiogram  Patient thinks he may have had gout in the past.  Gen.: Patient in no acute distress Cardiovascular: Regular rate and rhythm, no rubs Pulmonary: No increased work of breathing, no wheezes  [place], [place]

## 2016-01-30 NOTE — Progress Notes (Signed)
This shift  Pt arrived to unit room 1512 via stretcher. VS taken PT oriented to room and callbell with no complications. Wife at bedside, no complaint of pain. Unsteady and general weakness. Will continue to monitor. Initial assessment completed

## 2016-01-30 NOTE — H&P (Signed)
History and Physical    Fernando [name] ZOX:[id number] DOB: July 01, 1945 DOA: 01/29/2016  PCP: [facility]   Patient coming from: Home  Chief Complaint: Left knee, leg, and foot pain  HPI: Fernando [name] is a 70 y.o. gentleman with a history of HTN, DM, CKD (severity unknown) with a single kidney, CHF (type and severity unknown), obesity, and cognitive impairment (Aricept on his home medication list), who presents to the ED tonight accompanied by his wife.  His main complaint is acute on chronic left knee, leg, and foot pain.  He has actually has chronic swelling in both legs and feet, but he says that his pain is significantly worse on the left.  He has required steroid injections in his left knee twice in the past (most recent in February).  He denies any known history of gout.  His wife recalls being told that he had crystals present in the fluid aspirated from his knee in the past, but she cannot recall what type of crystals they were.  The reports that he cannot ambulate or even bear weight on his left leg anymore because of the pain.  He denies injury or acute trauma.  He has actually been on an increased dose of torsemide (40mg  BID) for the past 6-7 weeks for persistent LE edema.  His wife reports that the swelling has gotten better, but it has never resolved.  His legs have been red and tender for as long as he has had the persistent edema.  He denies chest pain or shortness of breath.  He is not on home oxygen at baseline.  He denies cough or sputum production for me.  No nasal congestion.  ED Course: The patient has demonstrated mild hypoxia in the ED requiring supplemental O2.  He had transient bradycardia and relative hypotension.  He is now tachycardic with exertion.  EKG shows bigeminy.  First troponin negative.  Chest xray is not conclusive for pneumonia.  Mild cardiomegaly and vascular congestion.  Normal lactic acid level.  BUN 72, creatinine 3.77 (current baseline  levels unknown; last labs in our system are from 2011).  I agree with the ED attending that he is likely intravascularly volume deplete even though he still has evidence of third spacing of fluid.  I am also very concerned about his renal function, particularly since he only has one kidney.  Unfortunately, the patient nor wife can really provide meaningful details regarding his chronic comorbidities.  He is being admitted for evaluation and management of probable AKI, to rule out early pneumonia, to evaluate his cardiac status, and for further evaluation of his knee pain with acute on chronic lower extremity swelling (despite increased diuretic dosing).  Review of Systems: As per HPI otherwise 10 point review of systems negative. No history of blood clots.  No known history of gout.   Past Medical History:  Diagnosis Date  . Arthritis   . Diabetes mellitus without complication (HCC)   . Hypertension   . Renal disorder   CKD CHF Single kidney Cognitive impairment  Past Surgical History:  Procedure Laterality Date  . BACK SURGERY    . NEPHRECTOMY Left 1967     reports that he quit smoking about 45 years ago. He has never used smokeless tobacco. He reports that he does not drink alcohol or use drugs.  He is married.  He has two adult children.  No Known Allergies  FAMILY HISTORY: His son required 6v CABG earlier this year.  Father required aortic valve replacement.  Prior to Admission medications   Medication Sig Start Date End Date Taking? Authorizing Provider  aspirin EC 81 MG tablet Take 81 mg by mouth every other day. Sunday, Tuesday, Thursday, and Saturday.   Yes Historical Provider, MD  carvedilol (COREG) 6.25 MG tablet Take 6.25 mg by mouth 2 (two) times daily with a meal.   Yes Historical Provider, MD  cholecalciferol (VITAMIN D) 1000 units tablet Take 1,000 Units by mouth daily.   Yes Historical Provider, MD  docusate sodium (COLACE) 50 MG capsule Take 50 mg by mouth 2 (two)  times daily.   Yes Historical Provider, MD  donepezil (ARICEPT) 10 MG tablet Take 5 mg by mouth every morning.   Yes Historical Provider, MD  finasteride (PROSCAR) 5 MG tablet Take 5 mg by mouth daily.   Yes Historical Provider, MD  glipiZIDE (GLUCOTROL) 5 MG tablet Take 5 mg by mouth 2 (two) times daily before a meal.   Yes Historical Provider, MD  losartan (COZAAR) 50 MG tablet Take 50 mg by mouth daily.   Yes Historical Provider, MD  magnesium oxide (MAG-OX) 400 MG tablet Take 400 mg by mouth 3 (three) times a week. Monday, Wednesday, and Friday.   Yes Historical Provider, MD  simvastatin (ZOCOR) 80 MG tablet Take 40 mg by mouth 3 (three) times a week. Monday, Wednesday, and Friday.   Yes Historical Provider, MD  spironolactone (ALDACTONE) 25 MG tablet Take 25 mg by mouth every morning.   Yes Historical Provider, MD  terazosin (HYTRIN) 5 MG capsule Take 5 mg by mouth at bedtime.   Yes Historical Provider, MD  torsemide (DEMADEX) 20 MG tablet Take 20 mg by mouth 2 (two) times daily.   Yes Historical Provider, MD  vitamin B-12 (CYANOCOBALAMIN) 1000 MCG tablet Take 1,000 mcg by mouth daily.   Yes Historical Provider, MD    Physical Exam: Vitals:   01/29/16 2347 01/29/16 2352 01/30/16 0000 01/30/16 0127  BP:  122/55 (!) 100/50 (!) 108/45  Pulse:  89 (!) 39 (!) 37  Resp:  22 21 20   Temp:    98.3 F (36.8 C)  TempSrc:    Oral  SpO2: (S) (!) 88% 96% 97% 96%  Weight:      Height:          Constitutional: NAD, calm, comfortable at rest but turns red and becomes tachycardic with exertion Vitals:   01/29/16 2347 01/29/16 2352 01/30/16 0000 01/30/16 0127  BP:  122/55 (!) 100/50 (!) 108/45  Pulse:  89 (!) 39 (!) 37  Resp:  22 21 20   Temp:    98.3 F (36.8 C)  TempSrc:    Oral  SpO2: (S) (!) 88% 96% 97% 96%  Weight:      Height:       Eyes: PERRL, lids and conjunctivae normal ENMT: Mucous membranes are moist. Posterior pharynx clear of any exudate or lesions. Normal dentition.  Neck:  normal appearance, supple, no masses Respiratory: clear to auscultation bilaterally, no wheezing, no crackles. Normal respiratory effort. No accessory muscle use.  Cardiovascular: Normal rate, regular rhythm, no murmurs / rubs / gallops. 2+ pitting edema in both legs.  2+ pedal pulses.  GI: abdomen is soft and compressible.  No distention.  No tenderness.  No masses palpated.  Bowel sounds are present. Musculoskeletal:  No joint deformity in upper and lower extremities. Good ROM, no contractures. Normal muscle tone.  Skin: Chronic stasis changes in both legs.  Warm  and dry Neurologic: CN 2-12 grossly intact. Sensation intact, Strength symmetric bilaterally, 5/5  Psychiatric: Normal judgment and insight. Alert and oriented x 3. Normal mood.     Labs on Admission: I have personally reviewed following labs and imaging studies  CBC:  Recent Labs Lab 01/29/16 2046  WBC 10.0  NEUTROABS 7.2  HGB 11.7*  HCT 37.4*  MCV 88.4  PLT 223   Basic Metabolic Panel:  Recent Labs Lab 01/29/16 2046  NA 139  K 4.2  CL 100*  CO2 31  GLUCOSE 111*  BUN 72*  CREATININE 3.77*  CALCIUM 9.3   GFR: Estimated Creatinine Clearance: 24.8 mL/min (by C-G formula based on SCr of 3.77 mg/dL). Liver Function Tests:  Recent Labs Lab 01/29/16 2046  AST 12*  ALT 11*  ALKPHOS 42  BILITOT 0.8  PROT 7.4  ALBUMIN 3.5   Urine analysis:    Component Value Date/Time   COLORURINE YELLOW 01/29/2016 2351   APPEARANCEUR CLEAR 01/29/2016 2351   LABSPEC 1.014 01/29/2016 2351   PHURINE 5.0 01/29/2016 2351   GLUCOSEU NEGATIVE 01/29/2016 2351   HGBUR NEGATIVE 01/29/2016 2351   BILIRUBINUR SMALL (A) 01/29/2016 2351   KETONESUR NEGATIVE 01/29/2016 2351   PROTEINUR 30 (A) 01/29/2016 2351   UROBILINOGEN 0.2 07/15/2009 2249   NITRITE NEGATIVE 01/29/2016 2351   LEUKOCYTESUR NEGATIVE 01/29/2016 2351   Sepsis Labs:  Lactic acid 1.67  Radiological Exams on Admission: Dg Chest 1 View  Result Date:  01/29/2016 CLINICAL DATA:  Acute onset of left knee pain and left lower extremity swelling and erythema. Initial encounter. EXAM: CHEST 1 VIEW COMPARISON:  Chest radiograph performed 07/15/2009 FINDINGS: The lungs are well-aerated. Mild vascular congestion is noted. Mild left basilar opacity may reflect atelectasis. No definite pleural effusion or pneumothorax is seen. The cardiomediastinal silhouette is mildly enlarged. No acute osseous abnormalities are seen. IMPRESSION: Mild vascular congestion and mild cardiomegaly. Left basilar opacity may reflect atelectasis. Electronically Signed   By: Roanna Raider M.D.   On: 01/29/2016 21:44   Dg Knee Complete 4 Views Left  Result Date: 01/29/2016 CLINICAL DATA:  Acute onset of left knee pain.  Initial encounter. EXAM: LEFT KNEE - COMPLETE 4+ VIEW COMPARISON:  None. FINDINGS: There is no evidence of fracture or dislocation. The joint spaces are preserved. No significant degenerative change is seen; the patellofemoral joint is grossly unremarkable in appearance. A small knee joint effusion is seen. Mild chondrocalcinosis is noted. IMPRESSION: 1. No evidence of fracture or dislocation. 2. Small knee joint effusion noted. 3. Mild chondrocalcinosis noted. Electronically Signed   By: Roanna Raider M.D.   On: 01/29/2016 21:43    EKG: Independently reviewed. Sinus rhythm, ventricular bigeminy.  Assessment/Plan Principal Problem:   AKI (acute kidney injury) (HCC) Active Problems:   Single kidney   CKD (chronic kidney disease)   HTN (hypertension)   Diabetes (HCC)   CHF (congestive heart failure) (HCC)   Hypoxia   Tachycardia   Hypotension      AKI --Clinical picture is complicated but I believe that he likely has AKI on CKD with intravascular depletion based on reported increased diuretic use, relative hypotension, tachycardia with exertion, and history of single kidney.  I will HOLD diuretics now plus his terazosin and ARB. --Gentle hydration with NS  overnight; repeat BMP in the AM --Urine electrolytes, renal ultrasound (U/A does not appear infected) --Strict I/O and daily weights --Will need records from the [place] --I suspect he will need nephrology consult eventually  CHF, severity unknown --  Echo requested in the AM --Normal BNP also argues against acute exacerbation --Monitor hemodynamics but will not aggressively diuresis at this point as outline above --Serial troponin (though patient denies chest pain and shortness of breath)  Hypoxia --Will need repeat chest xray in 1-2 days to assess for evolving pneumonia --Incentive spirometer q1h while awake --Supplemental oxygen as needed --Screening for ACS  Acute on chronic edema --I do not believe that he has a cellulitis at this point. --Edema is not new but pain seems out of proportion to clinical exam.  Will check lower extremity dopplers. --Holding diuretics for now as outline above  Left leg pain --Empiric prednisone 40mg  po daily for now, narcotic analgesics as needed.  NSAIDs contraindicated due to AKI. --Will check sed rate, CRP, CPK levels --Uric acid level --May eventually be a candidate for another local injection to the knee once clinical work-up complete --Held on PT order for now given hemodynamic instability --May need additional imagine  Sinus arrhythmia, intermittent tachycardia and bradycardia --Telemetry monitoring --Serial troponion --Echo in the AM --Hold parameters given for coreg (because he was tachycardic for me) but may need to hold completely for bradycardia --Check TFTs --Consider cardiology consult in the AM  DM --Hold oral medications --SSI coverage for now   DVT prophylaxis: Lovenox Code Status: FULL  Family Communication: Patient's wife present at bedside in the ED at time of admission Disposition Plan: To be determined Consults called: None but I suspect he will need Nephrology and Cardiology Admission status: Inpatient  telemetry   TIME SPENT: 75 minutes   Jerene Bears MD [facility] Pager [phone]  If 7PM-7AM, please contact night-coverage [email or web address] Password [email or web address]  01/30/2016, 1:50 AM

## 2016-01-30 NOTE — Progress Notes (Signed)
VASCULAR LAB PRELIMINARY  PRELIMINARY  PRELIMINARY  PRELIMINARY  Bilateral lower extremity venous duplex completed.    Preliminary report:  Bilateral - No evidence of DVT or superficial thrombus. There is no evidence of a Baker's cyst on the right and there is an area of mixed echoes in the popliteal fossa consistent with a Baker's cyst  Fernando [name], RVS 01/30/2016, 1:52 PM

## 2016-01-30 NOTE — Progress Notes (Signed)
  Echocardiogram 2D Echocardiogram has been performed.  Leta JunglingCooper, Simeon Vera M 01/30/2016, 11:12 AM

## 2016-01-30 NOTE — Progress Notes (Signed)
CRITICAL VALUE ALERT  Critical value received:  Troponin 0.07  Date of notification:  01/30/16  Time of notification:  0250  Critical value read back:Yes.    Nurse who received alert:  N. Sena Slateumas  MD notified (1st page):  Burnadette PeterLynch, NP  Time of first page:  0255  MD notified (2nd page):  Time of second page:  Responding MD:  Burnadette PeterLynch, NP  Time MD responded: [phone]

## 2016-01-31 LAB — URINE CULTURE: Culture: NO GROWTH

## 2016-01-31 LAB — GLUCOSE, CAPILLARY
GLUCOSE-CAPILLARY: 207 mg/dL — AB (ref 65–99)
GLUCOSE-CAPILLARY: 142 mg/dL — AB (ref 65–99)
Glucose-Capillary: 112 mg/dL — ABNORMAL HIGH (ref 65–99)

## 2016-01-31 NOTE — Progress Notes (Signed)
PROGRESS NOTE    Fernando [name]  ZOX:[id number] DOB: February 21, 1946 DOA: 01/29/2016 PCP: [facility]    Brief Narrative:  70 y.o. gentleman with a history of HTN, DM, CKD (severity unknown) with a single kidney, CHF (type and severity unknown), obesity, and cognitive impairment (Aricept on his home medication list), who presents to the ED tonight accompanied by his wife.  His main complaint is acute on chronic left knee, leg, and foot pain.  Patient diagnosed with AKI and was brought in for further evaluation recommendations.  Assessment & Plan:   Principal Problem:   AKI (acute kidney injury) (HCC) in context of patient with CKD - Serum creatinine improving with gentle IV fluids - Agree with holding ARB  Dementia - Continue current regimen, stable  Active Problems:   Single kidney   HTN (hypertension) - Stable currently, patient on beta blocker    Diabetes (HCC) - Continue SSI, continue carb modified diet    CHF (congestive heart failure) (HCC) - Compensated currently, continue beta blocker, holding ARB secondary to principal problem   DVT prophylaxis: Lovenox Code Status: Full Family Communication: Discussed with spouse Disposition Plan: Pending improvement in condition. Start discharge planning today most likely DC next a.m.   Consultants:   None   Procedures: Echocardiogram   Antimicrobials: None   Subjective: Patient has no new complaints. No acute issues overnight  Objective: Vitals:   01/30/16 2111 01/31/16 0511 01/31/16 0839 01/31/16 1456  BP: 114/62 106/63  (!) 165/73  Pulse: 78 67  79  Resp: 18 18  18   Temp: 98.2 F (36.8 C) 97.9 F (36.6 C)  98.1 F (36.7 C)  TempSrc: Oral Oral  Oral  SpO2: 97% 99%  92%  Weight:  133 kg (293 lb 3.4 oz) 129.3 kg (285 lb 0.9 oz)   Height:        Intake/Output Summary (Last 24 hours) at 01/31/16 1542 Last data filed at 01/31/16 0843  Gross per 24 hour  Intake                0 ml  Output              1270 ml  Net            -1270 ml   Filed Weights   01/30/16 0506 01/31/16 0511 01/31/16 0839  Weight: 133.8 kg (294 lb 15.6 oz) 133 kg (293 lb 3.4 oz) 129.3 kg (285 lb 0.9 oz)    Examination:  General exam: Appears calm and comfortable, in nad. Respiratory system: Clear to auscultation. Respiratory effort normal. Cardiovascular system: S1 & S2 heard, RRR. No rubs.+ pedal edema. Gastrointestinal system: Abdomen is nondistended, soft and nontender. No organomegaly or masses felt. Normal bowel sounds heard. Central nervous system: Alert and oriented. No focal neurological deficits. Extremities: Symmetric 5 x 5 power. Skin: brawny edema, lesions or ulcers, on limited exam Psychiatry: Mood & affect appropriate.     Data Reviewed: I have personally reviewed following labs and imaging studies  CBC:  Recent Labs Lab 01/29/16 2046 01/30/16 0157  WBC 10.0 8.7  NEUTROABS 7.2  --   HGB 11.7* 11.0*  HCT 37.4* 34.6*  MCV 88.4 85.6  PLT 223 200   Basic Metabolic Panel:  Recent Labs Lab 01/29/16 2046 01/30/16 0157  NA 139 139  K 4.2 3.8  CL 100* 101  CO2 31 28  GLUCOSE 111* 99  BUN 72* 69*  CREATININE 3.77* 3.57*  CALCIUM 9.3 8.9  GFR: Estimated Creatinine Clearance: 26.4 mL/min (by C-G formula based on SCr of 3.57 mg/dL). Liver Function Tests:  Recent Labs Lab 01/29/16 2046  AST 12*  ALT 11*  ALKPHOS 42  BILITOT 0.8  PROT 7.4  ALBUMIN 3.5   No results for input(s): LIPASE, AMYLASE in the last 168 hours. No results for input(s): AMMONIA in the last 168 hours. Coagulation Profile: No results for input(s): INR, PROTIME in the last 168 hours. Cardiac Enzymes:  Recent Labs Lab 01/30/16 0157 01/30/16 0717 01/30/16 1330  CKTOTAL 86  --   --   TROPONINI 0.07* 0.07* 0.06*   BNP (last 3 results) No results for input(s): PROBNP in the last 8760 hours. HbA1C: No results for input(s): HGBA1C in the last 72 hours. CBG:  Recent Labs Lab 01/30/16 1201  01/30/16 1645 01/30/16 2102 01/31/16 0728  GLUCAP 134* 236* 184* 112*   Lipid Profile: No results for input(s): CHOL, HDL, LDLCALC, TRIG, CHOLHDL, LDLDIRECT in the last 72 hours. Thyroid Function Tests:  Recent Labs  01/30/16 0157  TSH 1.200  FREET4 0.97   Anemia Panel: No results for input(s): VITAMINB12, FOLATE, FERRITIN, TIBC, IRON, RETICCTPCT in the last 72 hours. Sepsis Labs:  Recent Labs Lab 01/29/16 2055  LATICACIDVEN 1.67    Recent Results (from the past 240 hour(s))  Culture, blood (Routine X 2) w Reflex to ID Panel     Status: None (Preliminary result)   Collection Time: 01/29/16  8:47 PM  Result Value Ref Range Status   Specimen Description BLOOD LEFT FOREARM  Final   Special Requests IN PEDIATRIC BOTTLE 3 CC  Final   Culture   Final    NO GROWTH < 24 HOURS Performed at [facility]    Report Status PENDING  Incomplete  Culture, blood (Routine X 2) w Reflex to ID Panel     Status: None (Preliminary result)   Collection Time: 01/29/16  8:47 PM  Result Value Ref Range Status   Specimen Description BLOOD RIGHT FOREARM  Final   Special Requests BOTTLES DRAWN AEROBIC AND ANAEROBIC 5 CC  Final   Culture   Final    NO GROWTH < 24 HOURS Performed at [facility]    Report Status PENDING  Incomplete  Urine culture     Status: None   Collection Time: 01/29/16 11:51 PM  Result Value Ref Range Status   Specimen Description URINE, CLEAN CATCH  Final   Special Requests NONE  Final   Culture NO GROWTH Performed at [facility]   Final   Report Status 01/31/2016 FINAL  Final         Radiology Studies: Dg Chest 1 View  Result Date: 01/29/2016 CLINICAL DATA:  Acute onset of left knee pain and left lower extremity swelling and erythema. Initial encounter. EXAM: CHEST 1 VIEW COMPARISON:  Chest radiograph performed 07/15/2009 FINDINGS: The lungs are well-aerated. Mild vascular congestion is noted. Mild left basilar opacity may reflect  atelectasis. No definite pleural effusion or pneumothorax is seen. The cardiomediastinal silhouette is mildly enlarged. No acute osseous abnormalities are seen. IMPRESSION: Mild vascular congestion and mild cardiomegaly. Left basilar opacity may reflect atelectasis. Electronically Signed   By: Roanna Raider M.D.   On: 01/29/2016 21:44   US Renal  Result Date: 01/30/2016 CLINICAL DATA:  Acute renal failure with increasing BUN and creatinine. EXAM: RENAL / URINARY TRACT ULTRASOUND COMPLETE COMPARISON:  None. FINDINGS: Right Kidney: Length: 14.5 cm. Echogenicity within normal limits. No hydronephrosis visualized. There  is a 4 x 3.6 x 4.2 cm simple cyst in the upper pole right kidney. Left Kidney: Status post prior left nephrectomy. Bladder: Appears normal for degree of bladder distention. IMPRESSION: Simple cyst in the right kidney. No hydronephrosis is identified. Left kidney surgically removed. Electronically Signed   By: Sherian ReinWei-Chen  Lin M.D.   On: 01/30/2016 08:05   Dg Knee Complete 4 Views Left  Result Date: 01/29/2016 CLINICAL DATA:  Acute onset of left knee pain.  Initial encounter. EXAM: LEFT KNEE - COMPLETE 4+ VIEW COMPARISON:  None. FINDINGS: There is no evidence of fracture or dislocation. The joint spaces are preserved. No significant degenerative change is seen; the patellofemoral joint is grossly unremarkable in appearance. A small knee joint effusion is seen. Mild chondrocalcinosis is noted. IMPRESSION: 1. No evidence of fracture or dislocation. 2. Small knee joint effusion noted. 3. Mild chondrocalcinosis noted. Electronically Signed   By: Roanna RaiderJeffery  Chang M.D.   On: 01/29/2016 21:43        Scheduled Meds: . aspirin EC  81 mg Oral QODAY  . atorvastatin  40 mg Oral q1800  . carvedilol  6.25 mg Oral BID WC  . docusate sodium  50 mg Oral BID  . donepezil  5 mg Oral q morning - 10a  . enoxaparin (LOVENOX) injection  30 mg Subcutaneous Q24H  . finasteride  5 mg Oral Daily  . insulin aspart   0-9 Units Subcutaneous TID WC  . [START ON 02/01/2016] magnesium oxide  400 mg Oral Once per day on Mon Wed Fri  . predniSONE  40 mg Oral Q breakfast  . sodium chloride flush  3 mL Intravenous Q12H   Continuous Infusions:    LOS: 1 day    Time spent: > 35 minutes    Penny PiaVEGA, [place] St[place], MD [facility] Pager [phone]  If 7PM-7AM, please contact night-coverage [email or web address] Password [email or web address] 01/31/2016, 3:42 PM

## 2016-02-01 LAB — BASIC METABOLIC PANEL
Anion gap: 6 (ref 5–15)
BUN: 58 mg/dL — AB (ref 6–20)
CHLORIDE: 106 mmol/L (ref 101–111)
CO2: 30 mmol/L (ref 22–32)
Calcium: 9.3 mg/dL (ref 8.9–10.3)
Creatinine, Ser: 2.2 mg/dL — ABNORMAL HIGH (ref 0.61–1.24)
GFR calc non Af Amer: 28 mL/min — ABNORMAL LOW (ref >60)
GFR, EST AFRICAN AMERICAN: 33 mL/min — AB (ref >60)
Glucose, Bld: 120 mg/dL — ABNORMAL HIGH (ref 65–99)
POTASSIUM: 4.6 mmol/L (ref 3.5–5.1)
SODIUM: 142 mmol/L (ref 135–145)

## 2016-02-01 LAB — GLUCOSE, CAPILLARY
GLUCOSE-CAPILLARY: 150 mg/dL — AB (ref 65–99)
GLUCOSE-CAPILLARY: 116 mg/dL — AB (ref 65–99)
GLUCOSE-CAPILLARY: 147 mg/dL — AB (ref 65–99)
Glucose-Capillary: 103 mg/dL — ABNORMAL HIGH (ref 65–99)

## 2016-02-01 NOTE — Discharge Summary (Signed)
Physician Discharge Summary  Oleh GeninRichard A Howerton EAV:[id number] DOB: 1945-09-29 DOA: 01/29/2016  [facility]  Admit date: 01/29/2016 Discharge date: 02/01/2016  Time spent: > 35 minutes  Recommendations for Outpatient Follow-up:  1. Please decide when to continue ARB and Demadex 2. Repeat BMP to assess serum creatinine   Discharge Diagnoses:  Principal Problem:   AKI (acute kidney injury) (HCC) Active Problems:   Single kidney   CKD (chronic kidney disease)   HTN (hypertension)   Diabetes (HCC)   CHF (congestive heart failure) (HCC)   Hypoxia   Tachycardia   Hypotension   Discharge Condition: Stable  Diet recommendation: Heart healthy  Filed Weights   01/31/16 0511 01/31/16 0839 02/01/16 0600  Weight: 133 kg (293 lb 3.4 oz) 129.3 kg (285 lb 0.9 oz) 133.3 kg (293 lb 14 oz)    History of present illness:   70 y.o.gentleman with a history of HTN, DM, CKD (severity unknown) with a single kidney, CHF (type and severity unknown), obesity, and cognitive impairment (Aricept on his home medication list), who presents to the ED tonight accompanied by his wife. His main complaint is acute on chronic left knee, leg, and foot pain.  Patient diagnosed with AKI and was brought in for further evaluation recommendations.  Hospital Course:   Principal Problem:   AKI (acute kidney injury) (HCC) in context of patient with CKD - Serum creatinine improving with gentle IV fluids - Agree with holding ARB and demadex. This can be continued by his primary care physician after hospital discharge. I have discussed with patient and spouse and they verbalize their agreement and understanding.   Dementia - Continue prior to admission medication regimen.  Active Problems:   Single kidney    HTN (hypertension) - Stable currently, patient on beta blocker    Diabetes (HCC) - continue carb modified, diet patient to continue prior to admission medications regimen    CHF  (congestive heart failure) (HCC) - Compensated currently, continue beta blocker, holding ARB secondary to principal problem   Procedures:  None  Consultations:  none  Discharge Exam: Vitals:   02/01/16 0440 02/01/16 1105  BP: 111/82 (!) 144/62  Pulse: 78 (!) 54  Resp: 16 16  Temp: 98.1 F (36.7 C) 98.5 F (36.9 C)    General: Pt in nad, alert and awake Cardiovascular: rrr, no rubs Respiratory: no increased wob, no wheezes  Discharge Instructions   Discharge Instructions    Call MD for:  redness, tenderness, or signs of infection (pain, swelling, redness, odor or green/yellow discharge around incision site)    Complete by:  As directed   Call MD for:  temperature >100.4    Complete by:  As directed   Diet - low sodium heart healthy    Complete by:  As directed   Discharge instructions    Complete by:  As directed   I will hold the Demadex and ARB on discharge. Please discuss with your primary care physician within the next week to discuss when to continue these medications. He will need to have a BMP repeated   Increase activity slowly    Complete by:  As directed     Current Discharge Medication List    CONTINUE these medications which have NOT CHANGED   Details  aspirin EC 81 MG tablet Take 81 mg by mouth every other day. Sunday, Tuesday, Thursday, and Saturday.    carvedilol (COREG) 6.25 MG tablet Take 6.25 mg by mouth 2 (two) times daily  with a meal.    cholecalciferol (VITAMIN D) 1000 units tablet Take 1,000 Units by mouth daily.    docusate sodium (COLACE) 50 MG capsule Take 50 mg by mouth 2 (two) times daily.    donepezil (ARICEPT) 10 MG tablet Take 5 mg by mouth every morning.    finasteride (PROSCAR) 5 MG tablet Take 5 mg by mouth daily.    glipiZIDE (GLUCOTROL) 5 MG tablet Take 5 mg by mouth 2 (two) times daily before a meal.    magnesium oxide (MAG-OX) 400 MG tablet Take 400 mg by mouth 3 (three) times a week. Monday, Wednesday, and Friday.     simvastatin (ZOCOR) 80 MG tablet Take 40 mg by mouth 3 (three) times a week. Monday, Wednesday, and Friday.    spironolactone (ALDACTONE) 25 MG tablet Take 25 mg by mouth every morning.    terazosin (HYTRIN) 5 MG capsule Take 5 mg by mouth at bedtime.    vitamin B-12 (CYANOCOBALAMIN) 1000 MCG tablet Take 1,000 mcg by mouth daily.      STOP taking these medications     losartan (COZAAR) 50 MG tablet      torsemide (DEMADEX) 20 MG tablet        No Known Allergies    The results of significant diagnostics from this hospitalization (including imaging, microbiology, ancillary and laboratory) are listed below for reference.    Significant Diagnostic Studies: Dg Chest 1 View  Result Date: 01/29/2016 CLINICAL DATA:  Acute onset of left knee pain and left lower extremity swelling and erythema. Initial encounter. EXAM: CHEST 1 VIEW COMPARISON:  Chest radiograph performed 07/15/2009 FINDINGS: The lungs are well-aerated. Mild vascular congestion is noted. Mild left basilar opacity may reflect atelectasis. No definite pleural effusion or pneumothorax is seen. The cardiomediastinal silhouette is mildly enlarged. No acute osseous abnormalities are seen. IMPRESSION: Mild vascular congestion and mild cardiomegaly. Left basilar opacity may reflect atelectasis. Electronically Signed   By: Roanna Raider M.D.   On: 01/29/2016 21:44   US Renal  Result Date: 01/30/2016 CLINICAL DATA:  Acute renal failure with increasing BUN and creatinine. EXAM: RENAL / URINARY TRACT ULTRASOUND COMPLETE COMPARISON:  None. FINDINGS: Right Kidney: Length: 14.5 cm. Echogenicity within normal limits. No hydronephrosis visualized. There is a 4 x 3.6 x 4.2 cm simple cyst in the upper pole right kidney. Left Kidney: Status post prior left nephrectomy. Bladder: Appears normal for degree of bladder distention. IMPRESSION: Simple cyst in the right kidney. No hydronephrosis is identified. Left kidney surgically removed. Electronically  Signed   By: Sherian Rein M.D.   On: 01/30/2016 08:05   Dg Knee Complete 4 Views Left  Result Date: 01/29/2016 CLINICAL DATA:  Acute onset of left knee pain.  Initial encounter. EXAM: LEFT KNEE - COMPLETE 4+ VIEW COMPARISON:  None. FINDINGS: There is no evidence of fracture or dislocation. The joint spaces are preserved. No significant degenerative change is seen; the patellofemoral joint is grossly unremarkable in appearance. A small knee joint effusion is seen. Mild chondrocalcinosis is noted. IMPRESSION: 1. No evidence of fracture or dislocation. 2. Small knee joint effusion noted. 3. Mild chondrocalcinosis noted. Electronically Signed   By: Roanna Raider M.D.   On: 01/29/2016 21:43    Microbiology: Recent Results (from the past 240 hour(s))  Culture, blood (Routine X 2) w Reflex to ID Panel     Status: None (Preliminary result)   Collection Time: 01/29/16  8:47 PM  Result Value Ref Range Status   Specimen Description BLOOD  LEFT FOREARM  Final   Special Requests IN PEDIATRIC BOTTLE 3 CC  Final   Culture   Final    NO GROWTH 3 DAYS Performed at [facility]    Report Status PENDING  Incomplete  Culture, blood (Routine X 2) w Reflex to ID Panel     Status: None (Preliminary result)   Collection Time: 01/29/16  8:47 PM  Result Value Ref Range Status   Specimen Description BLOOD RIGHT FOREARM  Final   Special Requests BOTTLES DRAWN AEROBIC AND ANAEROBIC 5 CC  Final   Culture   Final    NO GROWTH 3 DAYS Performed at [facility]    Report Status PENDING  Incomplete  Urine culture     Status: None   Collection Time: 01/29/16 11:51 PM  Result Value Ref Range Status   Specimen Description URINE, CLEAN CATCH  Final   Special Requests NONE  Final   Culture NO GROWTH Performed at [facility]   Final   Report Status 01/31/2016 FINAL  Final     Labs: Basic Metabolic Panel:  Recent Labs Lab 01/29/16 2046 01/30/16 0157 02/01/16 0530  NA 139 139 142  K 4.2  3.8 4.6  CL 100* 101 106  CO2 31 28 30   GLUCOSE 111* 99 120*  BUN 72* 69* 58*  CREATININE 3.77* 3.57* 2.20*  CALCIUM 9.3 8.9 9.3   Liver Function Tests:  Recent Labs Lab 01/29/16 2046  AST 12*  ALT 11*  ALKPHOS 42  BILITOT 0.8  PROT 7.4  ALBUMIN 3.5   No results for input(s): LIPASE, AMYLASE in the last 168 hours. No results for input(s): AMMONIA in the last 168 hours. CBC:  Recent Labs Lab 01/29/16 2046 01/30/16 0157  WBC 10.0 8.7  NEUTROABS 7.2  --   HGB 11.7* 11.0*  HCT 37.4* 34.6*  MCV 88.4 85.6  PLT 223 200   Cardiac Enzymes:  Recent Labs Lab 01/30/16 0157 01/30/16 0717 01/30/16 1330  CKTOTAL 86  --   --   TROPONINI 0.07* 0.07* 0.06*   BNP: BNP (last 3 results)  Recent Labs  01/29/16 2046 01/30/16 0157  BNP 91.2 130.4*    ProBNP (last 3 results) No results for input(s): PROBNP in the last 8760 hours.  CBG:  Recent Labs Lab 01/31/16 1147 01/31/16 1704 01/31/16 2110 02/01/16 0734 02/01/16 1156  GLUCAP 150* 207* 142* 116* 147*    Signed:  Penny Pia MD.  [facility] 02/01/2016, 1:42 PM

## 2016-02-02 LAB — HEMOGLOBIN A1C
Hgb A1c MFr Bld: 7.5 % — ABNORMAL HIGH (ref 4.8–5.6)
MEAN PLASMA GLUCOSE: 169 mg/dL

## 2016-02-03 LAB — CULTURE, BLOOD (ROUTINE X 2)
CULTURE: NO GROWTH
CULTURE: NO GROWTH

## 2016-11-09 ENCOUNTER — Inpatient Hospital Stay (HOSPITAL_COMMUNITY): Payer: Medicare Other

## 2016-11-09 ENCOUNTER — Emergency Department (HOSPITAL_COMMUNITY): Payer: Medicare Other

## 2016-11-09 ENCOUNTER — Encounter (HOSPITAL_COMMUNITY): Payer: Self-pay | Admitting: *Deleted

## 2016-11-09 ENCOUNTER — Inpatient Hospital Stay (HOSPITAL_COMMUNITY)
Admission: EM | Admit: 2016-11-09 | Discharge: 2016-11-15 | DRG: 291 | Disposition: A | Discharge status: Home Health | Payer: Medicare Other | Attending: Family Medicine | Admitting: Family Medicine

## 2016-11-09 DIAGNOSIS — N184 Chronic kidney disease, stage 4 (severe): Secondary | ICD-10-CM | POA: Diagnosis not present

## 2016-11-09 DIAGNOSIS — R0600 Dyspnea, unspecified: Secondary | ICD-10-CM

## 2016-11-09 DIAGNOSIS — N400 Enlarged prostate without lower urinary tract symptoms: Secondary | ICD-10-CM | POA: Diagnosis present

## 2016-11-09 DIAGNOSIS — Z6841 Body Mass Index (BMI) 40.0 and over, adult: Secondary | ICD-10-CM

## 2016-11-09 DIAGNOSIS — Z8249 Family history of ischemic heart disease and other diseases of the circulatory system: Secondary | ICD-10-CM | POA: Diagnosis not present

## 2016-11-09 DIAGNOSIS — I878 Other specified disorders of veins: Secondary | ICD-10-CM | POA: Diagnosis present

## 2016-11-09 DIAGNOSIS — R748 Abnormal levels of other serum enzymes: Secondary | ICD-10-CM | POA: Diagnosis not present

## 2016-11-09 DIAGNOSIS — I130 Hypertensive heart and chronic kidney disease with heart failure and stage 1 through stage 4 chronic kidney disease, or unspecified chronic kidney disease: Principal | ICD-10-CM | POA: Diagnosis present

## 2016-11-09 DIAGNOSIS — Z905 Acquired absence of kidney: Secondary | ICD-10-CM | POA: Diagnosis not present

## 2016-11-09 DIAGNOSIS — E872 Acidosis: Secondary | ICD-10-CM | POA: Diagnosis present

## 2016-11-09 DIAGNOSIS — M7989 Other specified soft tissue disorders: Secondary | ICD-10-CM

## 2016-11-09 DIAGNOSIS — I5030 Unspecified diastolic (congestive) heart failure: Secondary | ICD-10-CM

## 2016-11-09 DIAGNOSIS — N179 Acute kidney failure, unspecified: Secondary | ICD-10-CM | POA: Diagnosis present

## 2016-11-09 DIAGNOSIS — I5031 Acute diastolic (congestive) heart failure: Secondary | ICD-10-CM | POA: Diagnosis present

## 2016-11-09 DIAGNOSIS — M109 Gout, unspecified: Secondary | ICD-10-CM | POA: Diagnosis present

## 2016-11-09 DIAGNOSIS — Z7982 Long term (current) use of aspirin: Secondary | ICD-10-CM | POA: Diagnosis not present

## 2016-11-09 DIAGNOSIS — R778 Other specified abnormalities of plasma proteins: Secondary | ICD-10-CM

## 2016-11-09 DIAGNOSIS — D631 Anemia in chronic kidney disease: Secondary | ICD-10-CM | POA: Diagnosis present

## 2016-11-09 DIAGNOSIS — E119 Type 2 diabetes mellitus without complications: Secondary | ICD-10-CM

## 2016-11-09 DIAGNOSIS — I360 Nonrheumatic tricuspid (valve) stenosis: Secondary | ICD-10-CM | POA: Diagnosis not present

## 2016-11-09 DIAGNOSIS — Z87891 Personal history of nicotine dependence: Secondary | ICD-10-CM | POA: Diagnosis not present

## 2016-11-09 DIAGNOSIS — N289 Disorder of kidney and ureter, unspecified: Secondary | ICD-10-CM

## 2016-11-09 DIAGNOSIS — I248 Other forms of acute ischemic heart disease: Secondary | ICD-10-CM | POA: Diagnosis present

## 2016-11-09 DIAGNOSIS — R0602 Shortness of breath: Secondary | ICD-10-CM | POA: Diagnosis not present

## 2016-11-09 DIAGNOSIS — I509 Heart failure, unspecified: Secondary | ICD-10-CM

## 2016-11-09 DIAGNOSIS — I471 Supraventricular tachycardia: Secondary | ICD-10-CM | POA: Diagnosis present

## 2016-11-09 DIAGNOSIS — Z9119 Patient's noncompliance with other medical treatment and regimen: Secondary | ICD-10-CM

## 2016-11-09 DIAGNOSIS — M1990 Unspecified osteoarthritis, unspecified site: Secondary | ICD-10-CM | POA: Diagnosis not present

## 2016-11-09 DIAGNOSIS — Z7984 Long term (current) use of oral hypoglycemic drugs: Secondary | ICD-10-CM

## 2016-11-09 DIAGNOSIS — I872 Venous insufficiency (chronic) (peripheral): Secondary | ICD-10-CM | POA: Diagnosis present

## 2016-11-09 DIAGNOSIS — Z87442 Personal history of urinary calculi: Secondary | ICD-10-CM | POA: Diagnosis not present

## 2016-11-09 DIAGNOSIS — I110 Hypertensive heart disease with heart failure: Secondary | ICD-10-CM | POA: Diagnosis not present

## 2016-11-09 DIAGNOSIS — R0902 Hypoxemia: Secondary | ICD-10-CM | POA: Diagnosis present

## 2016-11-09 DIAGNOSIS — J9601 Acute respiratory failure with hypoxia: Secondary | ICD-10-CM | POA: Diagnosis not present

## 2016-11-09 DIAGNOSIS — I10 Essential (primary) hypertension: Secondary | ICD-10-CM | POA: Diagnosis not present

## 2016-11-09 DIAGNOSIS — E1122 Type 2 diabetes mellitus with diabetic chronic kidney disease: Secondary | ICD-10-CM | POA: Diagnosis not present

## 2016-11-09 DIAGNOSIS — R7989 Other specified abnormal findings of blood chemistry: Secondary | ICD-10-CM

## 2016-11-09 DIAGNOSIS — E662 Morbid (severe) obesity with alveolar hypoventilation: Secondary | ICD-10-CM | POA: Diagnosis not present

## 2016-11-09 DIAGNOSIS — I5033 Acute on chronic diastolic (congestive) heart failure: Secondary | ICD-10-CM

## 2016-11-09 LAB — CBC WITH DIFFERENTIAL/PLATELET
BASOS ABS: 0 10*3/uL (ref 0.0–0.1)
Basophils Relative: 0 %
EOS PCT: 3 %
Eosinophils Absolute: 0.2 10*3/uL (ref 0.0–0.7)
HCT: 36.3 % — ABNORMAL LOW (ref 39.0–52.0)
Hemoglobin: 11 g/dL — ABNORMAL LOW (ref 13.0–17.0)
LYMPHS PCT: 19 %
Lymphs Abs: 1.2 10*3/uL (ref 0.7–4.0)
MCH: 26.1 pg (ref 26.0–34.0)
MCHC: 30.3 g/dL (ref 30.0–36.0)
MCV: 86 fL (ref 78.0–100.0)
Monocytes Absolute: 0.5 10*3/uL (ref 0.1–1.0)
Monocytes Relative: 7 %
Neutro Abs: 4.7 10*3/uL (ref 1.7–7.7)
Neutrophils Relative %: 71 %
PLATELETS: 208 10*3/uL (ref 150–400)
RBC: 4.22 MIL/uL (ref 4.22–5.81)
RDW: 15.2 % (ref 11.5–15.5)
WBC: 6.5 10*3/uL (ref 4.0–10.5)

## 2016-11-09 LAB — URINALYSIS, ROUTINE W REFLEX MICROSCOPIC
BACTERIA UA: NONE SEEN
Bilirubin Urine: NEGATIVE
Glucose, UA: 50 mg/dL — AB
Hgb urine dipstick: NEGATIVE
KETONES UR: NEGATIVE mg/dL
LEUKOCYTES UA: NEGATIVE
NITRITE: NEGATIVE
Protein, ur: 30 mg/dL — AB
Specific Gravity, Urine: 1.011 (ref 1.005–1.030)
Squamous Epithelial / LPF: NONE SEEN
pH: 7 (ref 5.0–8.0)

## 2016-11-09 LAB — COMPREHENSIVE METABOLIC PANEL
ALT: 10 U/L — ABNORMAL LOW (ref 17–63)
AST: 13 U/L — AB (ref 15–41)
Albumin: 3.4 g/dL — ABNORMAL LOW (ref 3.5–5.0)
Alkaline Phosphatase: 46 U/L (ref 38–126)
Anion gap: 6 (ref 5–15)
BUN: 36 mg/dL — AB (ref 6–20)
CHLORIDE: 103 mmol/L (ref 101–111)
CO2: 31 mmol/L (ref 22–32)
Calcium: 8.9 mg/dL (ref 8.9–10.3)
Creatinine, Ser: 2.39 mg/dL — ABNORMAL HIGH (ref 0.61–1.24)
GFR calc Af Amer: 30 mL/min — ABNORMAL LOW (ref >60)
GFR calc non Af Amer: 26 mL/min — ABNORMAL LOW (ref >60)
GLUCOSE: 130 mg/dL — AB (ref 65–99)
Potassium: 4.4 mmol/L (ref 3.5–5.1)
SODIUM: 140 mmol/L (ref 135–145)
Total Bilirubin: 0.7 mg/dL (ref 0.3–1.2)
Total Protein: 6.8 g/dL (ref 6.5–8.1)

## 2016-11-09 LAB — GLUCOSE, CAPILLARY
GLUCOSE-CAPILLARY: 114 mg/dL — AB (ref 65–99)
Glucose-Capillary: 107 mg/dL — ABNORMAL HIGH (ref 65–99)

## 2016-11-09 LAB — I-STAT TROPONIN, ED: Troponin i, poc: 0.01 ng/mL (ref 0.00–0.08)

## 2016-11-09 LAB — TROPONIN I: TROPONIN I: 0.06 ng/mL — AB (ref <0.03)

## 2016-11-09 LAB — BRAIN NATRIURETIC PEPTIDE: B Natriuretic Peptide: 93.3 pg/mL (ref 0.0–100.0)

## 2016-11-09 MED ORDER — FINASTERIDE 5 MG PO TABS
5.0000 mg | ORAL_TABLET | Freq: Every day | ORAL | Status: DC
  Administered 2016-11-10 – 2016-11-15 (×6): 5 mg via ORAL
  Filled 2016-11-09 (×6): qty 1

## 2016-11-09 MED ORDER — DONEPEZIL HCL 10 MG PO TABS
10.0000 mg | ORAL_TABLET | Freq: Every day | ORAL | Status: DC
  Administered 2016-11-10 – 2016-11-15 (×6): 10 mg via ORAL
  Filled 2016-11-09 (×6): qty 1

## 2016-11-09 MED ORDER — ONDANSETRON HCL 4 MG PO TABS
4.0000 mg | ORAL_TABLET | Freq: Four times a day (QID) | ORAL | Status: DC | PRN
Start: 2016-11-09 — End: 2016-11-15

## 2016-11-09 MED ORDER — ACETAMINOPHEN 325 MG PO TABS: 650.0000 mg | ORAL_TABLET | Freq: Four times a day (QID) | ORAL | Status: DC | PRN

## 2016-11-09 MED ORDER — INSULIN ASPART 100 UNIT/ML ~~LOC~~ SOLN
0.0000 [IU] | Freq: Three times a day (TID) | SUBCUTANEOUS | Status: DC
  Administered 2016-11-10: 0 [IU] via SUBCUTANEOUS
  Administered 2016-11-10 – 2016-11-13 (×6): 2 [IU] via SUBCUTANEOUS
  Administered 2016-11-13: 3 [IU] via SUBCUTANEOUS
  Administered 2016-11-13: 2 [IU] via SUBCUTANEOUS
  Administered 2016-11-14: 3 [IU] via SUBCUTANEOUS
  Administered 2016-11-14 (×2): 2 [IU] via SUBCUTANEOUS

## 2016-11-09 MED ORDER — ONDANSETRON HCL 4 MG/2ML IJ SOLN: 4.0000 mg | Freq: Four times a day (QID) | INTRAMUSCULAR | Status: DC | PRN

## 2016-11-09 MED ORDER — SODIUM CHLORIDE 0.9% FLUSH: 3.0000 mL | INTRAVENOUS | Status: DC | PRN

## 2016-11-09 MED ORDER — ALLOPURINOL 100 MG PO TABS
100.0000 mg | ORAL_TABLET | Freq: Every day | ORAL | Status: DC
  Administered 2016-11-10 – 2016-11-15 (×6): 100 mg via ORAL
  Filled 2016-11-09 (×6): qty 1

## 2016-11-09 MED ORDER — TECHNETIUM TC 99M DIETHYLENETRIAME-PENTAACETIC ACID: 31.9000 | Freq: Once | INTRAVENOUS | Status: DC | PRN

## 2016-11-09 MED ORDER — SODIUM CHLORIDE 0.9 % IV SOLN: 250.0000 mL | INTRAVENOUS | Status: DC | PRN

## 2016-11-09 MED ORDER — SODIUM CHLORIDE 0.9% FLUSH
3.0000 mL | Freq: Two times a day (BID) | INTRAVENOUS | Status: DC
  Administered 2016-11-10 – 2016-11-15 (×7): 3 mL via INTRAVENOUS

## 2016-11-09 MED ORDER — ACETAMINOPHEN 650 MG RE SUPP: 650.0000 mg | Freq: Four times a day (QID) | RECTAL | Status: DC | PRN

## 2016-11-09 MED ORDER — HEPARIN SODIUM (PORCINE) 5000 UNIT/ML IJ SOLN
5000.0000 [IU] | Freq: Three times a day (TID) | INTRAMUSCULAR | Status: DC
  Administered 2016-11-10 – 2016-11-15 (×16): 5000 [IU] via SUBCUTANEOUS
  Filled 2016-11-09 (×16): qty 1

## 2016-11-09 MED ORDER — ASPIRIN EC 81 MG PO TBEC
81.0000 mg | DELAYED_RELEASE_TABLET | Freq: Every day | ORAL | Status: DC
  Administered 2016-11-09 – 2016-11-15 (×7): 81 mg via ORAL
  Filled 2016-11-09 (×7): qty 1

## 2016-11-09 MED ORDER — FUROSEMIDE 10 MG/ML IJ SOLN
40.0000 mg | Freq: Once | INTRAMUSCULAR | Status: AC
Start: 2016-11-09 — End: 2016-11-09
  Administered 2016-11-09: 40 mg via INTRAVENOUS
  Filled 2016-11-09: qty 4

## 2016-11-09 MED ORDER — TECHNETIUM TO 99M ALBUMIN AGGREGATED
4.4000 | Freq: Once | INTRAVENOUS | Status: AC | PRN
  Administered 2016-11-09: 4.4 via INTRAVENOUS

## 2016-11-09 MED ORDER — VITAMIN B-12 1000 MCG PO TABS
1000.0000 ug | ORAL_TABLET | Freq: Every day | ORAL | Status: DC
  Administered 2016-11-09 – 2016-11-14 (×6): 1000 ug via ORAL
  Filled 2016-11-09 (×6): qty 1

## 2016-11-09 MED ORDER — SODIUM CHLORIDE 0.9% FLUSH
3.0000 mL | Freq: Two times a day (BID) | INTRAVENOUS | Status: DC
  Administered 2016-11-10 – 2016-11-14 (×7): 3 mL via INTRAVENOUS

## 2016-11-09 NOTE — Progress Notes (Signed)
Assumed care of patient at . Agree with previous Nurse assessment.  Kelvon Giannini M. Sabryn Preslar, RN  

## 2016-11-09 NOTE — ED Notes (Signed)
Bed: [facility]1 Expected date:  Expected time:  Means of arrival:  Comments: 

## 2016-11-09 NOTE — H&P (Signed)
[facility] History and Physical  Fernando [name]:[id number] DOB: 11-Jul-1945 DOA: 11/09/2016   [facility]: [facility]  Specialists: Followed by nephrology at the [facility]  Chief Complaint: Shortness of breath  HPI: Fernando [name] is a 71 y.o. male with a past medical history of diabetes on oral agents, hypertension, lower extremity edema, lone kidney, chronic kidney disease unknown stage, morbid obesity, who was in his usual state of health till about 2-3 weeks ago when he started getting short of breath. This was mainly with exertion and subsequently, when at rest. He does have baseline orthopnea. Sleeps in a recliner. Denies any history of CHF. Denied any chest pain. Symptoms started to get worse. He had an appointment with his nephrologist yesterday at the [place]. He mentioned these symptoms to him and he was sent over to the emergency department. He underwent EKG and chest x-ray. All evaluation was unremarkable and patient was discharged home from the ED. Symptoms persisted through the night. He got more short of breath this morning and so decided to come into the hospital here. Last night he was called by his nephrologist stating that his creatinine had bumped up to 2.8 and he was asked to come off of torsemide. In the emergency department, patient was noted to be hypoxic in the mid to late 80s. He was placed on oxygen with improvement in his symptoms. He does have leg swelling but the they have been stable for the past many months. His activity level has been low for the past few weeks. Most history was provided by the patient's wife.  Patient denies any fever, chills. No nausea, vomiting, diarrhea. Due to hypoxia patient will require hospitalization.  Home Medications: Prior to Admission medications   Medication Sig Start Date End Date Taking? Authorizing Provider  allopurinol (ZYLOPRIM) 100 MG tablet Take 100 mg by mouth daily with breakfast.   Yes [provider]    aspirin EC 81 MG tablet Take 81 mg by mouth every other day. Takes on Sunday, Tuesday, Thursday, and Saturday   Yes [provider]  carvedilol (COREG) 6.25 MG tablet Take 6.25 mg by mouth 2 (two) times daily with a meal.   Yes [provider]  cholecalciferol (VITAMIN D) 1000 units tablet Take 1,000 Units by mouth daily with breakfast.    Yes [provider]  docusate sodium (COLACE) 50 MG capsule Take 50 mg by mouth 2 (two) times daily.   Yes [provider]  donepezil (ARICEPT) 10 MG tablet Take 10 mg by mouth daily with breakfast.    Yes [provider]  finasteride (PROSCAR) 5 MG tablet Take 5 mg by mouth daily with breakfast.    Yes [provider]  glipiZIDE (GLUCOTROL) 5 MG tablet Take 5 mg by mouth 2 (two) times daily before a meal.   Yes [provider]  losartan (COZAAR) 25 MG tablet Take 25 mg by mouth daily with breakfast.   Yes [provider]  magnesium oxide (MAG-OX) 400 MG tablet Take 400 mg by mouth every Monday, Wednesday, and Friday.    Yes [provider]  simvastatin (ZOCOR) 80 MG tablet Take 40 mg by mouth every Monday, Wednesday, and Friday.    Yes [provider]  spironolactone (ALDACTONE) 25 MG tablet Take 25 mg by mouth daily with supper.    Yes [provider]  terazosin (HYTRIN) 5 MG capsule Take 5 mg by mouth daily with supper.    Yes [provider]  torsemide (DEMADEX) 20 MG tablet Take 20 mg by mouth daily.   Yes [provider]  vitamin B-12 (CYANOCOBALAMIN) 1000 MCG tablet Take 1,000 mcg by mouth daily with supper.    Yes [provider]    Allergies: No Known Allergies  Past Medical History: Past Medical History:  Diagnosis Date  . Arthritis   . Diabetes mellitus without complication (HCC)   . Hypertension   . Renal disorder     Past Surgical History:  Procedure Laterality Date  . BACK SURGERY    . NEPHRECTOMY Left 1967     Social History: Lives with his wife in [place]. Denies smoking, alcohol use or illicit drug use. Low functional capacity at baseline. Does not use any assistive devices.   Family History:  Family History  Problem Relation Age of Onset  . Heart disease Mother      Review of Systems - History obtained from the patient General ROS: positive for  - fatigue and malaise Psychological ROS: negative Ophthalmic ROS: negative ENT ROS: negative Allergy and Immunology ROS: negative Hematological and Lymphatic ROS: negative Endocrine ROS: negative Respiratory ROS: as in hpi Cardiovascular ROS: as in hpi Gastrointestinal ROS: no abdominal pain, change in bowel habits, or black or bloody stools Genito-Urinary ROS: no dysuria, trouble voiding, or hematuria Musculoskeletal ROS: negative Neurological ROS: no TIA or stroke symptoms Dermatological ROS: rash over legs as before  Physical Examination  Vitals:   11/09/16 1100 11/09/16 1115 11/09/16 1130 11/09/16 1145  BP: (!) 86/44  (!) 86/49   Pulse: 67 67 (!) 59 65  Resp: 19 15 17 19   Temp:      TempSrc:      SpO2: 92% 94% 94% 92%  Weight:      Height:        BP (!) 86/49   Pulse 65   Temp 97.8 F (36.6 C) (Oral)   Resp 19   Ht 6' (1.829 m)   Wt 136.1 kg (300 lb)   SpO2 92%   BMI 40.69 kg/m   General appearance: alert, cooperative, appears stated age and morbidly obese Head: Normocephalic, without obvious abnormality, atraumatic Eyes: conjunctivae/corneas clear. PERRL, EOM's intact.  Throat: lips, mucosa, and tongue normal; teeth and gums normal Neck: no adenopathy, no carotid bruit, no JVD, supple, symmetrical, trachea midline and thyroid not enlarged, symmetric, no tenderness/mass/nodules Back: symmetric, no curvature. ROM normal. No CVA tenderness. Resp: Diminished air entry at the bases but no definite crackles or wheezing. Mild tachypnea. No use of accessory muscles. Cardio: regular rate and rhythm, S1, S2 normal,  no murmur, click, rub or gallop GI: soft, non-tender; bowel sounds normal; no masses,  no organomegaly Extremities: 1-2+ edema noted bilateral lower extremities Pulses: 2+ and symmetric Skin: Thickened, erythematous skin noted over both lower extremities anteriorly. Consistent with venous dermatitis Neurologic: Awake and alert. Oriented 3. No focal neurological deficits.   Labs on Admission: I have personally reviewed following labs and imaging studies  CBC:  Recent Labs Lab 11/09/16 0859  WBC 6.5  NEUTROABS 4.7  HGB 11.0*  HCT 36.3*  MCV 86.0  PLT 208   Basic Metabolic Panel:  Recent Labs Lab 11/09/16 0859  NA 140  K 4.4  CL 103  CO2 31  GLUCOSE 130*  BUN 36*  CREATININE 2.39*  CALCIUM 8.9   GFR: Estimated Creatinine Clearance: 40.5 mL/min (A) (by C-G formula based on SCr of 2.39 mg/dL (H)). Liver Function Tests:  Recent Labs Lab 11/09/16  0859  AST 13*  ALT 10*  ALKPHOS 46  BILITOT 0.7  PROT 6.8  ALBUMIN 3.4*   Urine analysis:    Component Value Date/Time   COLORURINE YELLOW 11/09/2016 1102   APPEARANCEUR CLEAR 11/09/2016 1102   LABSPEC 1.011 11/09/2016 1102   PHURINE 7.0 11/09/2016 1102   GLUCOSEU 50 (A) 11/09/2016 1102   HGBUR NEGATIVE 11/09/2016 1102   BILIRUBINUR NEGATIVE 11/09/2016 1102   KETONESUR NEGATIVE 11/09/2016 1102   PROTEINUR 30 (A) 11/09/2016 1102   UROBILINOGEN 0.2 07/15/2009 2249   NITRITE NEGATIVE 11/09/2016 1102   LEUKOCYTESUR NEGATIVE 11/09/2016 1102     Radiological Exams on Admission: Dg Chest Port 1 View  Result Date: 11/09/2016 CLINICAL DATA:  Shortness of breath EXAM: PORTABLE CHEST 1 VIEW COMPARISON:  01/29/2016 FINDINGS: Cardiomegaly and pulmonary vascular congestion. No air bronchogram or Kerley lines. No effusion or pneumothorax. Low lung volumes. IMPRESSION: Low volume chest with cardiomegaly and pulmonary vascular congestion. Electronically Signed   By: Marnee Spring M.D.   On: 11/09/2016 09:19    My  interpretation of Electrocardiogram: Appears to be sinus rhythm in the 50s. PACs noted. Nonspecific T-wave changes.   Problem List  Principal Problem:   Dyspnea Active Problems:   AKI (acute kidney injury) (HCC)   Single kidney   HTN (hypertension)   Diabetes (HCC)   Hypoxia   Morbid obesity (HCC)   Assessment: This is a 71 year old Caucasian male with a past medical history as stated earlier, who presents with shortness of breath. Noted to be hypoxic in the ED. ED physician felt that this was the fluid overload. However, his BNP is normal. His leg swelling has been stable according to patient and his wife. Chest x-ray does not show any overt pulmonary edema. Pulmonary embolism is a possibility. Obesity hypoventilation syndrome is a possibility.  Plan: #1 acute respiratory failure with hypoxia: Etiology unclear but differentials as noted above. VQ scan has been ordered. Lower extremity venous Doppler. Last echocardiogram was in September of last year. Due to patient's worsening symptoms it is reasonable to repeat this study.  #2 Acute renal failure in setting of lone kidney: Patient appears to have chronic kidney disease, but stage is unknown. History is creatinine was 2.8 yesterday per patient and today is 2.3. I would agree with his nephrologist and hold his diuretics for now. Patient does not appear to have any fluid overload. His leg swelling is at baseline. Monitor urine output. If creatinine does not improve or gets worse proceed with renal ultrasound. Ultrasound done in September showed a right renal cyst. He only has one kidney and is status post nephrectomy due to staghorn calculus many years ago. Hold his losartan. Avoid other nephrotoxic agents.  #3 Diabetes mellitus type 2 with renal complications: Hold his oral agents. Place him on sliding scale coverage. Check CBGs.  #4 history of gout: Continue with allopurinol.  #5 history of BPH: Continue with Proscar. Hold prazosin due to  low blood pressure.  #6 history of essential hypertension: Blood pressure noted to be borderline low here. It did drop some with the Lasix that was given by the ED. Blood pressure has improved. Continue to hold his blood pressure lowering agents for now.  #7 Morbid obesity: Body mass index is 40.69 kg/m.  #8 Normocytic anemia: Possibly due to chronic kidney disease. Check anemia panel.   DVT Prophylaxis: Subcutaneous heparin Code Status: Discussed in detail with patient and wife. Full code. He would not like life support for long-term. Family  Communication: Discussed with patient and his wife  Disposition Plan: Likely return home when ready for discharge. Will need PT and OT evaluation  Consults called: None  Admission status: Inpatient   Severity of Illness: The appropriate patient status for this patient is INPATIENT. Inpatient status is judged to be reasonable and necessary in order to provide the required intensity of service to ensure the patient's safety. The patient's presenting symptoms, physical exam findings, and initial radiographic and laboratory data in the context of their chronic comorbidities is felt to place them at high risk for further clinical deterioration. Furthermore, it is not anticipated that the patient will be medically stable for discharge from the hospital within 2 midnights of admission. The following factors support the patient status of inpatient.   " The patient's presenting symptoms include shortness of breath. " The worrisome physical exam findings include hypoxia. " The initial radiographic and laboratory data are worrisome because of acute renal failure. " The chronic co-morbidities include morbid obesity.   * I certify that at the point of admission it is my clinical judgment that the patient will require inpatient hospital care spanning beyond 2 midnights from the point of admission due to high intensity of service, high risk for further deterioration  and high frequency of surveillance required.*    Further management decisions will depend on results of further testing and patient's response to treatment.   [facility] HamletKRISHNAN,Serrena Linderman  [facility] Pager [phone]  If 7PM-7AM, please contact night-coverage [email or web address] Password [email or web address]  11/09/2016, 12:45 PM

## 2016-11-09 NOTE — Discharge Planning (Signed)
[facility]  [facility] who states they have medical beds available and may be transferred after they review pt chart.  [facility]

## 2016-11-09 NOTE — ED Triage Notes (Signed)
Patient is alert and oriented x4.  Patient has been complaining of shortness of breath over the past few weeks.  [facility] and DC  after an EKG was cleared.

## 2016-11-09 NOTE — ED Notes (Signed)
Patient  was stating 83/84% on room air he was placed on 2 liters of oxygen stats rose to 96%

## 2016-11-09 NOTE — Progress Notes (Signed)
CRITICAL VALUE ALERT  Critical Value:  Troponin 0.06  Date & Time Notied:  11/09/16  2205  Provider Notified: Craige CottaKirby, NP  Orders Received/Actions taken: NP made aware

## 2016-11-09 NOTE — ED Notes (Signed)
Pt. Is reticent to take furosemide, as he was told by [facility]. Physician yesterday to halt diuretics--Dr. Silverio LayYao was made aware of this.

## 2016-11-09 NOTE — ED Provider Notes (Signed)
[facility]-EMERGENCY DEPT Provider Note   CSN: [id number] Arrival date & time: 11/09/16  [id number]     History   Chief Complaint Chief Complaint  Patient presents with  . Shortness of Breath    HPI Oleh GeninRichard A Sturtevant is a 71 y.o. male history of renal insufficiency, hypertension, diabetes, possible diastolic heart failure (limited echo in 9/17, EF 55%), here presenting with worsening leg swelling, shortness of breath. Patient has been short of breath for the last week or so and the shortness of breath is worse with exertion. Also has progressive leg swelling as well. [facility] and was noted to have oxygen saturation runs 92-93% and has acute renal failure. Consequently, his torsemide was stopped. This morning he had worsening shortness of breath so came to the ER. Patient is noted to be hypoxic 82-83% in triage and was placed on 2 L nasal cannula. Patient is not on oxygen at home.   The history is provided by the patient.    Past Medical History:  Diagnosis Date  . Arthritis   . Diabetes mellitus without complication (HCC)   . Hypertension   . Renal disorder     Patient Active Problem List   Diagnosis Date Noted  . AKI (acute kidney injury) (HCC) 01/29/2016  . Single kidney 01/29/2016  . CKD (chronic kidney disease) 01/29/2016  . HTN (hypertension) 01/29/2016  . Diabetes (HCC) 01/29/2016  . CHF (congestive heart failure) (HCC) 01/29/2016  . Hypoxia 01/29/2016  . Tachycardia 01/29/2016  . Hypotension 01/29/2016    Past Surgical History:  Procedure Laterality Date  . BACK SURGERY    . NEPHRECTOMY Left 1967       Home Medications    Prior to Admission medications   Medication Sig Start Date End Date Taking? Authorizing Provider  aspirin EC 81 MG tablet Take 81 mg by mouth every other day. Sunday, Tuesday, Thursday, and Saturday.    [provider]  carvedilol (COREG) 6.25 MG tablet Take 6.25 mg by mouth 2 (two) times daily with a meal.     [provider]  cholecalciferol (VITAMIN D) 1000 units tablet Take 1,000 Units by mouth daily.    [provider]  docusate sodium (COLACE) 50 MG capsule Take 50 mg by mouth 2 (two) times daily.    [provider]  donepezil (ARICEPT) 10 MG tablet Take 5 mg by mouth every morning.    [provider]  finasteride (PROSCAR) 5 MG tablet Take 5 mg by mouth daily.    [provider]  glipiZIDE (GLUCOTROL) 5 MG tablet Take 5 mg by mouth 2 (two) times daily before a meal.    [provider]  magnesium oxide (MAG-OX) 400 MG tablet Take 400 mg by mouth 3 (three) times a week. Monday, Wednesday, and Friday.    [provider]  simvastatin (ZOCOR) 80 MG tablet Take 40 mg by mouth 3 (three) times a week. Monday, Wednesday, and Friday.    [provider]  spironolactone (ALDACTONE) 25 MG tablet Take 25 mg by mouth every morning.    [provider]  terazosin (HYTRIN) 5 MG capsule Take 5 mg by mouth at bedtime.    [provider]  vitamin B-12 (CYANOCOBALAMIN) 1000 MCG tablet Take 1,000 mcg by mouth daily.    [provider]    Family History History reviewed. No pertinent family history.  Social History Social History  Substance Use Topics  . Smoking status: Former Smoker  Quit date: 05/30/1970  . Smokeless tobacco: Never Used  . Alcohol use No     Allergies   Patient has no known allergies.   Review of Systems Review of Systems  Respiratory: Positive for shortness of breath.   All other systems reviewed and are negative.    Physical Exam Updated Vital Signs BP (!) 111/51 (BP Location: Left Arm)   Pulse 65   Temp 97.8 F (36.6 C) (Oral)   Resp 17   Ht 6' (1.829 m)   Wt 136.1 kg (300 lb)   SpO2 96%   BMI 40.69 kg/m   Physical Exam  Constitutional: He is oriented to person, place, and time.  Chronically ill, slightly tachypneic   HENT:  Head: Normocephalic.  Mouth/Throat:  Oropharynx is clear and moist.  Eyes: EOM are normal. Pupils are equal, round, and reactive to light.  Neck: Normal range of motion. Neck supple.  Cardiovascular: Normal rate, regular rhythm and normal heart sounds.   Pulmonary/Chest:  Tachypneic, + crackles bilateral bases, ? Mild wheezing   Abdominal: Soft. Bowel sounds are normal. He exhibits no distension. There is no tenderness. There is no guarding.  Musculoskeletal:  2+ edema with venous stasis changes, no obvious cellulitis. No calf tenderness   Neurological: He is alert and oriented to person, place, and time.  Skin: Skin is warm.  Psychiatric: He has a normal mood and affect.  Nursing note and vitals reviewed.    ED Treatments / Results  Labs (all labs ordered are listed, but only abnormal results are displayed) Labs Reviewed  CBC WITH DIFFERENTIAL/PLATELET  COMPREHENSIVE METABOLIC PANEL  BRAIN NATRIURETIC PEPTIDE  I-STAT TROPOININ, ED    EKG  EKG Interpretation  Date/Time:  Wednesday November 09 2016 08:32:59 EDT Ventricular Rate:  52 PR Interval:    QRS Duration: 110 QT Interval:  407 QTC Calculation: 379 R Axis:   79 Text Interpretation:  Age not entered, assumed to be  71 years old for purpose of ECG interpretation Sinus or ectopic atrial rhythm Atrial premature complexes Prolonged PR interval Low voltage, precordial leads No significant change since last tracing Confirmed by Richardean Canal [phone]) on 11/09/2016 8:40:14 AM       Radiology No results found.  Procedures Procedures (including critical care time)  Medications Ordered in ED Medications - No data to display   Initial Impression / Assessment and Plan / ED Course  I have reviewed the triage vital signs and the nursing notes.  Pertinent labs & imaging results that were available during my care of the patient were reviewed by me and considered in my medical decision making (see chart for details).     Dempsey JASTIN FORE is a 71 y.o. male here  with SOB, hypoxia. Appears fluid overloaded. I think likely worsening renal failure vs CHF. Will check labs, BNP, CXR.  11:55 AM Patient's CXR showed vascular congestion. Cr.  2.4, baseline around 2.2. BNp nl. However, he appears fluid overloaded and has oxygen requirement. Ordered lasix. Case manager came to talk to them regarding transfer to [place] but they refused and signed the refusal form. Will admit to hospitalist for CHF, renal insufficiency    Final Clinical Impressions(s) / ED Diagnoses   Final diagnoses:  None    New Prescriptions New Prescriptions   No medications on file     Charlynne Pander, MD 11/09/16 1157

## 2016-11-09 NOTE — ED Notes (Signed)
We have just turned his L2 back on to 2 l.p.m.

## 2016-11-09 NOTE — ED Notes (Signed)
He is now in nsr with occasional unifocal pvc's. He is in no distress, and his wife is at bedside.

## 2016-11-09 NOTE — Care Management (Signed)
ED CM met with patient and wife concerning possible transfer to a [place] healthcare facility.  CM discussed patient's options of being transferred to a [place] facility with or elect to continue care at a [facility] facility. Patient verbalized understanding and teach back was done. Patient and wife has decided to continue care at [facility], Patient completed and signed the Refusal of transfer form which was faxed to April Alexander Patient Transfer Coordinator at [place] ext. [phone]. Updated Dr. Darl Householder EDP.

## 2016-11-09 NOTE — Progress Notes (Signed)
*  Preliminary Results* Bilateral lower extremity venous duplex completed. Bilateral lower extremities are negative for deep vein thrombosis. No evidence of right Baker's cyst. There is evidence of left Baker's cyst.  11/09/2016 3:16 PM Gertie FeyMichelle Laryah Neuser, BS, RVT, RDCS, RDMS

## 2016-11-09 NOTE — ED Notes (Signed)
Note: at 0930 I surreptitiously turned his O2 off; and it remains so.

## 2016-11-10 ENCOUNTER — Inpatient Hospital Stay (HOSPITAL_COMMUNITY): Payer: Medicare Other

## 2016-11-10 ENCOUNTER — Other Ambulatory Visit (HOSPITAL_COMMUNITY): Payer: Medicare Other

## 2016-11-10 DIAGNOSIS — N289 Disorder of kidney and ureter, unspecified: Secondary | ICD-10-CM

## 2016-11-10 DIAGNOSIS — R7989 Other specified abnormal findings of blood chemistry: Secondary | ICD-10-CM

## 2016-11-10 DIAGNOSIS — I10 Essential (primary) hypertension: Secondary | ICD-10-CM

## 2016-11-10 DIAGNOSIS — I5030 Unspecified diastolic (congestive) heart failure: Secondary | ICD-10-CM

## 2016-11-10 DIAGNOSIS — R778 Other specified abnormalities of plasma proteins: Secondary | ICD-10-CM

## 2016-11-10 DIAGNOSIS — R0600 Dyspnea, unspecified: Secondary | ICD-10-CM

## 2016-11-10 DIAGNOSIS — R748 Abnormal levels of other serum enzymes: Secondary | ICD-10-CM

## 2016-11-10 LAB — COMPREHENSIVE METABOLIC PANEL
ALBUMIN: 3.3 g/dL — AB (ref 3.5–5.0)
ALT: 11 U/L — ABNORMAL LOW (ref 17–63)
ANION GAP: 12 (ref 5–15)
AST: 14 U/L — AB (ref 15–41)
Alkaline Phosphatase: 44 U/L (ref 38–126)
BUN: 32 mg/dL — AB (ref 6–20)
CHLORIDE: 101 mmol/L (ref 101–111)
CO2: 35 mmol/L — AB (ref 22–32)
Calcium: 9.8 mg/dL (ref 8.9–10.3)
Creatinine, Ser: 2.34 mg/dL — ABNORMAL HIGH (ref 0.61–1.24)
GFR calc Af Amer: 31 mL/min — ABNORMAL LOW (ref >60)
GFR calc non Af Amer: 26 mL/min — ABNORMAL LOW (ref >60)
GLUCOSE: 122 mg/dL — AB (ref 65–99)
POTASSIUM: 5.1 mmol/L (ref 3.5–5.1)
SODIUM: 148 mmol/L — AB (ref 135–145)
TOTAL PROTEIN: 6.7 g/dL (ref 6.5–8.1)
Total Bilirubin: 0.3 mg/dL (ref 0.3–1.2)

## 2016-11-10 LAB — FERRITIN: FERRITIN: 29 ng/mL (ref 24–336)

## 2016-11-10 LAB — GLUCOSE, CAPILLARY
GLUCOSE-CAPILLARY: 149 mg/dL — AB (ref 65–99)
GLUCOSE-CAPILLARY: 154 mg/dL — AB (ref 65–99)
GLUCOSE-CAPILLARY: 137 mg/dL — AB (ref 65–99)
Glucose-Capillary: 157 mg/dL — ABNORMAL HIGH (ref 65–99)

## 2016-11-10 LAB — BLOOD GAS, ARTERIAL
ACID-BASE EXCESS: 6.4 mmol/L — AB (ref 0.0–2.0)
Bicarbonate: 33.6 mmol/L — ABNORMAL HIGH (ref 20.0–28.0)
Drawn by: 50522
O2 Content: 3 L/min
O2 Saturation: 95.5 %
PH ART: 7.33 — AB (ref 7.350–7.450)
Patient temperature: 98.6
pCO2 arterial: 65.6 mmHg (ref 32.0–48.0)
pO2, Arterial: 82.4 mmHg — ABNORMAL LOW (ref 83.0–108.0)

## 2016-11-10 LAB — CBC
HEMATOCRIT: 37.7 % — AB (ref 39.0–52.0)
HEMOGLOBIN: 11 g/dL — AB (ref 13.0–17.0)
MCH: 25.9 pg — AB (ref 26.0–34.0)
MCHC: 29.2 g/dL — AB (ref 30.0–36.0)
MCV: 88.9 fL (ref 78.0–100.0)
Platelets: 206 10*3/uL (ref 150–400)
RBC: 4.24 MIL/uL (ref 4.22–5.81)
RDW: 15.2 % (ref 11.5–15.5)
WBC: 7.2 10*3/uL (ref 4.0–10.5)

## 2016-11-10 LAB — VITAMIN B12: Vitamin B-12: 1630 pg/mL — ABNORMAL HIGH (ref 180–914)

## 2016-11-10 LAB — FOLATE: FOLATE: 18 ng/mL (ref >5.9)

## 2016-11-10 LAB — TROPONIN I
TROPONIN I: 0.06 ng/mL — AB (ref <0.03)
Troponin I: 0.06 ng/mL (ref <0.03)

## 2016-11-10 LAB — IRON AND TIBC
Iron: 41 ug/dL — ABNORMAL LOW (ref 45–182)
SATURATION RATIOS: 12 % — AB (ref 17.9–39.5)
TIBC: 344 ug/dL (ref 250–450)
UIBC: 303 ug/dL

## 2016-11-10 LAB — RETICULOCYTES
RBC.: 4.24 MIL/uL (ref 4.22–5.81)
Retic Count, Absolute: 46.6 10*3/uL (ref 19.0–186.0)
Retic Ct Pct: 1.1 % (ref 0.4–3.1)

## 2016-11-10 MED ORDER — METOPROLOL TARTRATE 25 MG PO TABS
25.0000 mg | ORAL_TABLET | Freq: Two times a day (BID) | ORAL | Status: DC
  Administered 2016-11-10: 25 mg via ORAL
  Filled 2016-11-10: qty 1

## 2016-11-10 MED ORDER — METOPROLOL TARTRATE 5 MG/5ML IV SOLN
2.5000 mg | Freq: Once | INTRAVENOUS | Status: AC
  Administered 2016-11-10: 2.5 mg via INTRAVENOUS
  Filled 2016-11-10: qty 5

## 2016-11-10 MED ORDER — FUROSEMIDE 10 MG/ML IJ SOLN
20.0000 mg | Freq: Two times a day (BID) | INTRAMUSCULAR | Status: DC
  Administered 2016-11-10 – 2016-11-11 (×3): 20 mg via INTRAVENOUS
  Filled 2016-11-10 (×3): qty 2

## 2016-11-10 MED ORDER — SALINE SPRAY 0.65 % NA SOLN
1.0000 | NASAL | Status: DC | PRN
  Administered 2016-11-14: 1 via NASAL
  Filled 2016-11-10: qty 44

## 2016-11-10 NOTE — Progress Notes (Signed)
OT Cancellation Note  Patient Details Name: Oleh GeninRichard A Surges MRN: [id number] DOB: 04-16-1946   Cancelled Treatment:    Reason Eval/Treat Not Completed: Medical issues which prohibited therapy  Pt having stat labs and in SVT. RN stated no therapy at this time. Will check on pt later in day or next day Lise AuerLori Autry Prust, [place] [phone]  Einar CrowEDDING, Valaree Fresquez D 11/10/2016, 10:46 AM

## 2016-11-10 NOTE — Consult Note (Addendum)
Cardiology Consultation:   Patient ID: Fernando [name]; [id number]; 11-12-1945   Admit date: 11/09/2016 Date of Consult: 11/10/2016  [facility] Primary Cardiologist: None Primary Electrophysiologist:  None   Patient Profile:   Fernando [name] is a 71 y.o. male with a hx of DM, HTN, CKD, morbid obesity who is being seen today for the evaluation of SOB at the request of Dr. Edward JollySilva.  History of Present Illness:   Fernando [name] is a 71yo male with a history of DM, HTN, chronic venous insuff, CKD, morbid obesity but no history of cardiac disease, who [facility] with complaints of DOE.  This has been occurring over several weeks.  He was seen by his nephrologist on 6/12 who recommended admission to ER.  ER workup at that time included normal CXRAY and EKG and he was sent home.  His symptoms worsened and he returned to the ER with hypoxia with O2 sats in the mid 80's and Cxray c/w acute CHF.  He denies any chest pain or pressure but feels he cannot take a deep breath in.  While in the ER he apparently went into SVT but spontaneously converted to NSR.  His BNP is normal but felt to be due to underlying obesity.   VQ scan was low prob for PE and no evidence of DVT on LE venous dopplers.  He was started on IV lasix with good diuresis.  Of note, he has CKD stage 4 with on ly 1 kidney s/p nephrotomy due to staghorn calculus.  His baseline creatinine is around 2.2.  Troponin was found to be mildly elevated at 0.06 x 3 and flat.  Cardiology is now asked to evaluated to help with management of CHF. His wife states that he does not get SOB unless he tries to go outside and then gets very SOB.  He has multiple runs of SVT on the telemetry.  Past Medical History:  Diagnosis Date  . Arthritis   . Diabetes mellitus without complication (HCC)   . Hypertension   . Renal disorder     Past Surgical History:  Procedure Laterality Date  . BACK SURGERY    .  NEPHRECTOMY Left 1967     Inpatient Medications: Scheduled Meds: . allopurinol  100 mg Oral Q breakfast  . aspirin EC  81 mg Oral Daily  . donepezil  10 mg Oral Q breakfast  . finasteride  5 mg Oral Q breakfast  . furosemide  20 mg Intravenous BID  . heparin  5,000 Units Subcutaneous Q8H  . insulin aspart  0-15 Units Subcutaneous TID WC  . sodium chloride flush  3 mL Intravenous Q12H  . sodium chloride flush  3 mL Intravenous Q12H  . vitamin B-12  1,000 mcg Oral Q supper   Continuous Infusions: . sodium chloride     PRN Meds: sodium chloride, acetaminophen **OR** acetaminophen, ondansetron **OR** ondansetron (ZOFRAN) IV, sodium chloride, sodium chloride flush, technetium TC 4M diethylenetriame-pentaacetic acid  Allergies:   No Known Allergies  Social History:   Social History   Social History  . Marital status: Married    Spouse name: N/A  . Number of children: N/A  . Years of education: N/A   Occupational History  . Not on file.   Social History Main Topics  . Smoking status: Former Smoker    Quit date: 05/30/1970  . Smokeless tobacco: Never Used  . Alcohol use No  . Drug use: No  .  Sexual activity: No   Other Topics Concern  . Not on file   Social History Narrative  . No narrative on file    Family History:   The patient's family history includes Heart disease in his mother.  ROS:  Please see the history of present illness.  ROS  All other ROS reviewed and negative.     Physical Exam/Data:   Vitals:   11/09/16 2153 11/10/16 0500 11/10/16 0501 11/10/16 1436  BP: (!) 92/49  (!) 144/65 (!) 147/56  Pulse: 67  75 83  Resp: 16  18   Temp: 98.5 F (36.9 C)  98.1 F (36.7 C) 98.8 F (37.1 C)  TempSrc: Oral  Oral Oral  SpO2: 97%  96% 98%  Weight:  (!) 305 lb 1.9 oz (138.4 kg)    Height:        Intake/Output Summary (Last 24 hours) at 11/10/16 1553 Last data filed at 11/10/16 0900  Gross per 24 hour  Intake              680 ml  Output                 0 ml  Net              680 ml   Filed Weights   11/09/16 0826 11/09/16 1349 11/10/16 0500  Weight: 300 lb (136.1 kg) (!) 304 lb 10.8 oz (138.2 kg) (!) 305 lb 1.9 oz (138.4 kg)   Body mass index is 41.38 kg/m.  General:  Well nourished, well developed, in no acute distress HEENT: normal Lymph: no adenopathy Neck: no bruit Endocrine:  No thryomegaly Vascular: No carotid bruits; FA pulses 2+ bilaterally without bruits  Cardiac:  normal S1, S2; tachycardic ; no murmur  Lungs:  clear to auscultation bilaterally, no wheezing, rhonchi or rales  Abd: soft, nontender, no hepatomegaly  Ext: trace edema with chronic venous stasis changes Musculoskeletal:  No deformities, BUE and BLE strength normal and equal Skin: warm and dry  Neuro:  CNs 2-12 intact, no focal abnormalities noted Psych:  Normal affect    EKG:  The EKG today was personally reviewed and demonstrates SVT.  Admission EKG showed sinus bradycardia with PACs  Relevant CV Studies: 2D echo 01/2016 Study Conclusions  - Procedure narrative: Transthoracic echocardiography. Image   quality was suboptimal. The study was technically difficult, as a   result of poor acoustic windows and restricted patient mobility. - Left ventricle: The cavity size was normal. Wall thickness was   increased in a pattern of moderate LVH. Systolic function was   normal. The estimated ejection fraction was in the range of 55%   to 60%. Wall motion was normal; there were no regional wall   motion abnormalities. The study is not technically sufficient to   allow evaluation of LV diastolic function. - Aortic valve: Trileaflet. Sclerosis without stenosis. There was   no regurgitation.  Impressions:  - Technically difficult study with very limited echo windows.   Definity contrast not given. LVEF is grossly normal around   55-60%, cannot assess for wall motion abnormalities or diastolic   function.  Laboratory Data:  Chemistry Recent Labs Lab  11/09/16 0859 11/10/16 0558  NA 140 148*  K 4.4 5.1  CL 103 101  CO2 31 35*  GLUCOSE 130* 122*  BUN 36* 32*  CREATININE 2.39* 2.34*  CALCIUM 8.9 9.8  GFRNONAA 26* 26*  GFRAA 30* 31*  ANIONGAP 6 12  Recent Labs Lab 11/09/16 0859 11/10/16 0558  PROT 6.8 6.7  ALBUMIN 3.4* 3.3*  AST 13* 14*  ALT 10* 11*  ALKPHOS 46 44  BILITOT 0.7 0.3   Hematology Recent Labs Lab 11/09/16 0859 11/10/16 0558  WBC 6.5 7.2  RBC 4.22 4.24  4.24  HGB 11.0* 11.0*  HCT 36.3* 37.7*  MCV 86.0 88.9  MCH 26.1 25.9*  MCHC 30.3 29.2*  RDW 15.2 15.2  PLT 208 206   Cardiac Enzymes Recent Labs Lab 11/09/16 2046 11/10/16 0558 11/10/16 1045  TROPONINI 0.06* 0.06* 0.06*    Recent Labs Lab 11/09/16 0900  TROPIPOC 0.01    BNP Recent Labs Lab 11/09/16 0859  BNP 93.3    DDimer No results for input(s): DDIMER in the last 168 hours.  Radiology/Studies:  Nm Pulmonary Perf And Vent  Result Date: 11/09/2016 CLINICAL DATA:  71 year old male with shortness of breath. EXAM: NUCLEAR MEDICINE VENTILATION - PERFUSION LUNG SCAN TECHNIQUE: Ventilation images were obtained in multiple projections using inhaled aerosol Tc-58m DTPA. Perfusion images were obtained in multiple projections after intravenous injection of Tc-74m MAA. RADIOPHARMACEUTICALS:  31.9 mCi Technetium-39m DTPA aerosol inhalation and 4.4 mCi Technetium-69m MAA IV COMPARISON:  11/09/2016 chest radiograph FINDINGS: Ventilation: Decreased ventilation within the left lower lung noted. Perfusion: Decreased perfusion within the left lower lung noted. No other significant perfusion abnormalities noted. IMPRESSION: Low probability for pulmonary embolus (10-19%). Moderate to large matching ventilation/perfusion defect within the left lower lobe. In correlation with recent chest radiograph, this probably represents left lower lung consolidation/airspace disease. Electronically Signed   By: Harmon Pier M.D.   On: 11/09/2016 18:30   Dg Chest Port  1 View  Result Date: 11/10/2016 CLINICAL DATA:  Short of breath EXAM: PORTABLE CHEST 1 VIEW COMPARISON:  11/09/2016 FINDINGS: Pulmonary vascular congestion unchanged.  Negative for edema. Bibasilar airspace disease similar to the prior study, left greater than right negative for effusion IMPRESSION: Pulmonary vascular congestion unchanged Bibasilar airspace disease left greater than right unchanged Electronically Signed   By: Marlan Palau M.D.   On: 11/10/2016 12:33   Dg Chest Port 1 View  Result Date: 11/09/2016 CLINICAL DATA:  Shortness of breath EXAM: PORTABLE CHEST 1 VIEW COMPARISON:  01/29/2016 FINDINGS: Cardiomegaly and pulmonary vascular congestion. No air bronchogram or Kerley lines. No effusion or pneumothorax. Low lung volumes. IMPRESSION: Low volume chest with cardiomegaly and pulmonary vascular congestion. Electronically Signed   By: Marnee Spring M.D.   On: 11/09/2016 09:19    Assessment and Plan:   1. Probable acute diastolic CHF - echo 01/2016 with normal LVF.  ? Etiology. He has multiple episodes of SVT on tele and this may have been enough to put him into CHF.   Cxray with pulmonary venous congestion but no overt edema.  BNP is normal but can be falsely low in obesity.  Weight up 5lbs from yesterday but ? Accuracy.  He received 40mg  Lasix yesterday but I&Os appear incomplete.  Creatinine slightly improved from 2.39>2.34. He has mild LE edema with skin changes of chronic venous stasis. - agree with IV diuretics - Needs strict I&Os - follow renal function and lytes closely as sodium trending upward.  2.  SVT - he converted spontaneously to NSR but is having frequent episodes of SVT on tele that appear to be AVNRT.  His HR is in the 90's when in NSR.  Currently he is in SVT at 130bpm.   - give Lopressor 2.5mg  IV now - start Lopressor 25mg  BID now  and titrate as BP allows to suppress SVT.  3.  HTN - BP mildly elevated - this should improve with addition of lopressor  4.   Elevated troponin at 0.06 x 3, flat trend, and likely related to CHF, SVT and CKD stage 4. - will repeat 2D echo to make sure LVF intact.   - he is not a candidate for cath given CKD stage 4 and would be at high risk for progressing to ESRD needing HD if we proceeded with cath.  Therefore no further ischemic workup if LVF normal since he has no CP.  5.  Morbid obesity with CO2 retention on ABG.  Likely has obesity hypoventilation syndrome.   - recommend outpt sleep study   Signed, Armanda Magic, MD  11/10/2016 3:53 PM

## 2016-11-10 NOTE — Evaluation (Signed)
Physical Therapy Evaluation Patient Details Name: Fernando [name] MRN: [id number] DOB: 08/28/1945 Today's Date: 11/10/2016   History of Present Illness  Fernando [name] is a [age] y.o. male with a past medical history of diabetes on oral agents, hypertension, lower extremity edema, lone kidney, chronic kidney disease unknown stage, morbid obesity, who was in his usual state of health till about 2-3 weeks ago when he started getting short of breath  Clinical Impression  Pt admitted with above diagnosis. Pt currently with functional limitations due to the deficits listed below (see PT Problem List).  Pt will benefit from skilled PT to increase their independence and safety with mobility to allow discharge to the venue listed below.   Pt with very limited activity tolerance and diminished motivation; agreable to OOB with strong encouragement;  O2 sats decr to 86% on RA with transfer; incr to >91% on 2L at rest     Follow Up Recommendations Home health PT;Supervision for mobility/OOB    Equipment Recommendations  Other (comment) (TBA)    Recommendations for Other Services       Precautions / Restrictions Precautions Precautions: Fall      Mobility  Bed Mobility Overal bed mobility: Needs Assistance Bed Mobility: Supine to Sit     Supine to sit: Min guard;HOB elevated     General bed mobility comments: cues to self assist (wants wife to pull him up); heavy use of rail (sleeps in recliner)  Transfers Overall transfer level: Needs assistance Equipment used: Rolling walker (2 wheeled) Transfers: Sit to/from [employer] CorporationStand;Stand Pivot Transfers Sit to Stand: Min assist Stand pivot transfers: Min assist       General transfer comment: assist to rise and control descent  Ambulation/Gait Ambulation/Gait assistance: Min assist Ambulation Distance (Feet): 2 Feet Assistive device: Rolling walker (2 wheeled)       General Gait Details: pivotal steps to chair  Stairs             Wheelchair Mobility    Modified Rankin (Stroke Patients Only)       Balance                                             Pertinent Vitals/Pain Pain Assessment: No/denies pain    Home Living Family/patient expects to be discharged to:: Private residence Living Arrangements: Spouse/significant other Available Help at Discharge: Family Type of Home: House Home Access: Stairs to enter Entrance Stairs-Rails: None Secretary/administratorntrance Stairs-Number of Steps: 3 Home Layout: One level Home Equipment: None      Prior Function Level of Independence: Needs assistance   Gait / Transfers Assistance Needed: limited household ambulator, sleeps in recliner  ADL's / Homemaking Assistance Needed: uses urinal, only walks to bathroom for BM        Hand Dominance        Extremity/Trunk Assessment   Upper Extremity Assessment Upper Extremity Assessment: Defer to OT evaluation    Lower Extremity Assessment Lower Extremity Assessment: Generalized weakness       Communication   Communication: No difficulties  Cognition Arousal/Alertness: Awake/alert Behavior During Therapy: WFL for tasks assessed/performed Overall Cognitive Status: Within Functional Limits for tasks assessed  General Comments      Exercises General Exercises - Lower Extremity Ankle Circles/Pumps: AROM;Both;15 reps   Assessment/Plan    PT Assessment Patient needs continued PT services  PT Problem List Decreased strength;Decreased activity tolerance;Decreased mobility;Decreased knowledge of use of DME;Obesity       PT Treatment Interventions DME instruction;Gait training;Functional mobility training;Therapeutic activities;Therapeutic exercise;Patient/family education    PT Goals (Current goals can be found in the Care Plan section)  Acute Rehab PT Goals Patient Stated Goal: none stated PT Goal Formulation: With patient Time For Goal  Achievement: 11/17/16    Frequency Min 3X/week   Barriers to discharge        Co-evaluation               AM-PAC PT "6 Clicks" Daily Activity  Outcome Measure Difficulty turning over in bed (including adjusting bedclothes, sheets and blankets)?: Total Difficulty moving from lying on back to sitting on the side of the bed? : A Lot Difficulty sitting down on and standing up from a chair with arms (e.g., wheelchair, bedside commode, etc,.)?: A Lot Help needed moving to and from a bed to chair (including a wheelchair)?: A Little Help needed walking in hospital room?: A Little Help needed climbing 3-5 steps with a railing? : A Little 6 Click Score: 14    End of Session Equipment Utilized During Treatment: Gait belt Activity Tolerance: Patient limited by fatigue Patient left: in chair;with call bell/phone within reach;with family/visitor present        Time: 1610-9604 PT Time Calculation (min) (ACUTE ONLY): 24 min   Charges:   PT Evaluation $PT Eval Moderate Complexity: 1 Procedure PT Treatments $Therapeutic Activity: 8-22 mins   PT G CodesDrucilla Chalet, PT Pager: [phone] 11/10/2016   [facility] 11/10/2016, 3:31 PM

## 2016-11-10 NOTE — Progress Notes (Signed)
PROGRESS NOTE [facility]   CHANTRY HEADEN   WUJ:[id number] DOB: 1946-03-14  DOA: 11/09/2016 PCP: [facility]   Brief Narrative:  Fernando [name] is a 71 year old male with past medical history of diabetes, hypertension, chronic venous insufficiency, chronic kidney disease, morbidly obese and a long kidney presented to the emergency department complaining of shortness of breath. This started a month ago and has progressively worsened over the past few weeks prior to admission. He was seen by his nephrologist on 11/08/16 due to shortness of breath and recommended ED evaluation. He underwent EKG and chest x-ray with unremarkable results and patient was discharged home from EP. Symptoms persisted and he came to the emergency department where he was noted to be hypoxic in the mid 80s, he was placed in oxygen with some improvement. Chest x-ray showed increasing vascular congestion. Patient was admitted for possible CHF and further management.  Subjective: Patient seen and examined, report that he can't get enough oxygen, feel that can't take a deep breath. Denies chest pain and palpitations. Patient went into SVT while I was in the room came back to normal rhythm w/o intervention   Assessment & Plan: Dyspnea - unclear etiology at this time, CXR show pulmonary vascular congestion, no Hx of CHF On physical exam, breath sound decrease throughout, but no crackles or wheezing or rales noted. Suspect new onset of CHF, normal BNP likely due to obesity, has been diuresing well with IV Lasix  Low prob for PE per VQ scan, although will get an ABG, no DVT on doppler Differential include OHS, allergies as pt turbinates are markedly inflame - Start saline spray  Will place in IV Lasix BID for now  Echocardiogram pending Repeat EKG in AM  Monitor I&O's, low salt diet, daily weights   Acute renal failure on CKD 4  Only have 1 kidney, as he is s/p nephrotomy due to staghorn calculus    Likely cardiorenal syndrome - sCr expect to improve with diuresis  Cr close to apparent baseline 2.20 Continue IV Lasix for now  Monitor BMP in AM   DM type 2  CBG's stable Placed on SSI  Continue to monitor  SVT with mild elevated troponin TNI likely from demand ischemia  SVT converted to normal sinus rhythm  Cardiology consulted   HTN - BP stable  Continue current regimen  Monitor BP   Morbid obesity - BMI > 41.4 Nutrition consult   Normocytic anemia  Hgb stable  Continue to monitor   DVT prophylaxis: Heparin subcutaneous Code Status: Full code Family Communication: Wife at bedside Disposition Plan: Unable to determine this time  Consultants:   None  Procedures:   Echo pending  Antimicrobials: Antibiotics Given (last 72 hours)    None       Objective: Vitals:   11/09/16 2153 11/10/16 0500 11/10/16 0501 11/10/16 1436  BP: (!) 92/49  (!) 144/65 (!) 147/56  Pulse: 67  75 83  Resp: 16  18   Temp: 98.5 F (36.9 C)  98.1 F (36.7 C) 98.8 F (37.1 C)  TempSrc: Oral  Oral Oral  SpO2: 97%  96% 98%  Weight:  (!) 138.4 kg (305 lb 1.9 oz)    Height:        Intake/Output Summary (Last 24 hours) at 11/10/16 1441 Last data filed at 11/10/16 0900  Gross per 24 hour  Intake              680 ml  Output  0 ml  Net              680 ml   Filed Weights   11/09/16 0826 11/09/16 1349 11/10/16 0500  Weight: 136.1 kg (300 lb) (!) 138.2 kg (304 lb 10.8 oz) (!) 138.4 kg (305 lb 1.9 oz)    Examination:  General exam: Mild anxiety/distress due to shortness of breath HEENT: Nasal turbinates with moderate hypertrophy, patient on 2 L nasal cannula Respiratory system: Difficult exam due to body habitus, posterior auscultation diminished breath sounds throughout, no crackles or rales were noted Cardiovascular system: S1 & S2 heard, tachycardia @ 130. No JVD, murmurs, rubs or gallops Gastrointestinal system: Abdomen morbidly obese, soft, nontender  nondistended, positive bowel sounds. Central nervous system: Alert and oriented. No focal neurological deficits. Extremities: Lower extremity edema bilaterally nonpitting. Discoloration on both extremity likely due to chronic venous stasis. Symmetric Skin: No rashes, lesions or ulcers Psychiatry: Judgement and insight appear normal. Mood & affect appropriate.   Data Reviewed: I have personally reviewed following labs and imaging studies  CBC:  Recent Labs Lab 11/09/16 0859 11/10/16 0558  WBC 6.5 7.2  NEUTROABS 4.7  --   HGB 11.0* 11.0*  HCT 36.3* 37.7*  MCV 86.0 88.9  PLT 208 206   Basic Metabolic Panel:  Recent Labs Lab 11/09/16 0859 11/10/16 0558  NA 140 148*  K 4.4 5.1  CL 103 101  CO2 31 35*  GLUCOSE 130* 122*  BUN 36* 32*  CREATININE 2.39* 2.34*  CALCIUM 8.9 9.8   GFR: Estimated Creatinine Clearance: 41.7 mL/min (A) (by C-G formula based on SCr of 2.34 mg/dL (H)). Liver Function Tests:  Recent Labs Lab 11/09/16 0859 11/10/16 0558  AST 13* 14*  ALT 10* 11*  ALKPHOS 46 44  BILITOT 0.7 0.3  PROT 6.8 6.7  ALBUMIN 3.4* 3.3*   Cardiac Enzymes:  Recent Labs Lab 11/09/16 2046 11/10/16 0558 11/10/16 1045  TROPONINI 0.06* 0.06* 0.06*   CBG:  Recent Labs Lab 11/09/16 1658 11/09/16 2151 11/10/16 0850 11/10/16 1150  GLUCAP 114* 107* 157* 149*   Anemia Panel:  Recent Labs  11/10/16 0558  VITAMINB12 1,630*  FOLATE 18.0  FERRITIN 29  TIBC 344  IRON 41*  RETICCTPCT 1.1   Sepsis Labs: No results for input(s): PROCALCITON, LATICACIDVEN in the last 168 hours.  No results found for this or any previous visit (from the past 240 hour(s)).    Radiology Studies: Nm Pulmonary Perf And Vent  Result Date: 11/09/2016 CLINICAL DATA:  71 year old male with shortness of breath. EXAM: NUCLEAR MEDICINE VENTILATION - PERFUSION LUNG SCAN TECHNIQUE: Ventilation images were obtained in multiple projections using inhaled aerosol Tc-5975m DTPA. Perfusion images  were obtained in multiple projections after intravenous injection of Tc-8575m MAA. RADIOPHARMACEUTICALS:  31.9 mCi Technetium-175m DTPA aerosol inhalation and 4.4 mCi Technetium-475m MAA IV COMPARISON:  11/09/2016 chest radiograph FINDINGS: Ventilation: Decreased ventilation within the left lower lung noted. Perfusion: Decreased perfusion within the left lower lung noted. No other significant perfusion abnormalities noted. IMPRESSION: Low probability for pulmonary embolus (10-19%). Moderate to large matching ventilation/perfusion defect within the left lower lobe. In correlation with recent chest radiograph, this probably represents left lower lung consolidation/airspace disease. Electronically Signed   By: Harmon PierJeffrey  Hu M.D.   On: 11/09/2016 18:30   Dg Chest Port 1 View  Result Date: 11/10/2016 CLINICAL DATA:  Short of breath EXAM: PORTABLE CHEST 1 VIEW COMPARISON:  11/09/2016 FINDINGS: Pulmonary vascular congestion unchanged.  Negative for edema. Bibasilar airspace disease similar to  the prior study, left greater than right negative for effusion IMPRESSION: Pulmonary vascular congestion unchanged Bibasilar airspace disease left greater than right unchanged Electronically Signed   By: Marlan Palau M.D.   On: 11/10/2016 12:33   Dg Chest Port 1 View  Result Date: 11/09/2016 CLINICAL DATA:  Shortness of breath EXAM: PORTABLE CHEST 1 VIEW COMPARISON:  01/29/2016 FINDINGS: Cardiomegaly and pulmonary vascular congestion. No air bronchogram or Kerley lines. No effusion or pneumothorax. Low lung volumes. IMPRESSION: Low volume chest with cardiomegaly and pulmonary vascular congestion. Electronically Signed   By: Marnee Spring M.D.   On: 11/09/2016 09:19    Scheduled Meds: . allopurinol  100 mg Oral Q breakfast  . aspirin EC  81 mg Oral Daily  . donepezil  10 mg Oral Q breakfast  . finasteride  5 mg Oral Q breakfast  . furosemide  20 mg Intravenous BID  . heparin  5,000 Units Subcutaneous Q8H  . insulin  aspart  0-15 Units Subcutaneous TID WC  . sodium chloride flush  3 mL Intravenous Q12H  . sodium chloride flush  3 mL Intravenous Q12H  . vitamin B-12  1,000 mcg Oral Q supper   Continuous Infusions: . sodium chloride       LOS: 1 day    Time spent: Total of 35 minutes spent with pt, greater than 50% of which was spent in discussion of  treatment, counseling and coordination of care    Latrelle Dodrill, MD Pager: Text Page via [email or web address]  [phone]  If 7PM-7AM, please contact night-coverage [email or web address] Password [facility] 11/10/2016, 2:41 PM

## 2016-11-11 ENCOUNTER — Inpatient Hospital Stay (HOSPITAL_COMMUNITY): Payer: Medicare Other

## 2016-11-11 DIAGNOSIS — I360 Nonrheumatic tricuspid (valve) stenosis: Secondary | ICD-10-CM

## 2016-11-11 DIAGNOSIS — E1122 Type 2 diabetes mellitus with diabetic chronic kidney disease: Secondary | ICD-10-CM

## 2016-11-11 DIAGNOSIS — I5033 Acute on chronic diastolic (congestive) heart failure: Secondary | ICD-10-CM

## 2016-11-11 DIAGNOSIS — I5031 Acute diastolic (congestive) heart failure: Secondary | ICD-10-CM

## 2016-11-11 DIAGNOSIS — J9601 Acute respiratory failure with hypoxia: Secondary | ICD-10-CM

## 2016-11-11 DIAGNOSIS — R0602 Shortness of breath: Secondary | ICD-10-CM

## 2016-11-11 DIAGNOSIS — I471 Supraventricular tachycardia: Secondary | ICD-10-CM

## 2016-11-11 LAB — BASIC METABOLIC PANEL
ANION GAP: 8 (ref 5–15)
BUN: 31 mg/dL — AB (ref 6–20)
CHLORIDE: 103 mmol/L (ref 101–111)
CO2: 30 mmol/L (ref 22–32)
Calcium: 8.7 mg/dL — ABNORMAL LOW (ref 8.9–10.3)
Creatinine, Ser: 2.23 mg/dL — ABNORMAL HIGH (ref 0.61–1.24)
GFR calc Af Amer: 32 mL/min — ABNORMAL LOW (ref >60)
GFR calc non Af Amer: 28 mL/min — ABNORMAL LOW (ref >60)
GLUCOSE: 126 mg/dL — AB (ref 65–99)
POTASSIUM: 5 mmol/L (ref 3.5–5.1)
Sodium: 141 mmol/L (ref 135–145)

## 2016-11-11 LAB — CBC
HCT: 36.6 % — ABNORMAL LOW (ref 39.0–52.0)
HEMOGLOBIN: 10.8 g/dL — AB (ref 13.0–17.0)
MCH: 26.2 pg (ref 26.0–34.0)
MCHC: 29.5 g/dL — ABNORMAL LOW (ref 30.0–36.0)
MCV: 88.6 fL (ref 78.0–100.0)
Platelets: 189 10*3/uL (ref 150–400)
RBC: 4.13 MIL/uL — ABNORMAL LOW (ref 4.22–5.81)
RDW: 14.8 % (ref 11.5–15.5)
WBC: 7.5 10*3/uL (ref 4.0–10.5)

## 2016-11-11 LAB — ECHOCARDIOGRAM COMPLETE
Height: 72 in
WEIGHTICAEL: 4871.28 [oz_av]

## 2016-11-11 LAB — TROPONIN I: Troponin I: 0.06 ng/mL (ref <0.03)

## 2016-11-11 LAB — GLUCOSE, CAPILLARY
Glucose-Capillary: 120 mg/dL — ABNORMAL HIGH (ref 65–99)
Glucose-Capillary: 121 mg/dL — ABNORMAL HIGH (ref 65–99)
Glucose-Capillary: 118 mg/dL — ABNORMAL HIGH (ref 65–99)
Glucose-Capillary: 123 mg/dL — ABNORMAL HIGH (ref 65–99)

## 2016-11-11 MED ORDER — PERFLUTREN LIPID MICROSPHERE
1.0000 mL | INTRAVENOUS | Status: AC | PRN
  Administered 2016-11-11: 3 mL via INTRAVENOUS
  Filled 2016-11-11: qty 10

## 2016-11-11 MED ORDER — METOPROLOL TARTRATE 25 MG PO TABS
25.0000 mg | ORAL_TABLET | Freq: Three times a day (TID) | ORAL | Status: DC
  Administered 2016-11-11 – 2016-11-12 (×4): 25 mg via ORAL
  Filled 2016-11-11 (×4): qty 1

## 2016-11-11 MED ORDER — PERFLUTREN LIPID MICROSPHERE
INTRAVENOUS | Status: AC
  Filled 2016-11-11: qty 10

## 2016-11-11 MED ORDER — PERFLUTREN LIPID MICROSPHERE
1.0000 mL | INTRAVENOUS | Status: DC | PRN
  Filled 2016-11-11: qty 10

## 2016-11-11 MED ORDER — FUROSEMIDE 10 MG/ML IJ SOLN
40.0000 mg | Freq: Two times a day (BID) | INTRAMUSCULAR | Status: DC
  Administered 2016-11-11 – 2016-11-13 (×5): 40 mg via INTRAVENOUS
  Filled 2016-11-11 (×5): qty 4

## 2016-11-11 MED ORDER — PREMIER PROTEIN SHAKE
11.0000 [oz_av] | Freq: Two times a day (BID) | ORAL | Status: DC
  Administered 2016-11-11 – 2016-11-14 (×4): 11 [oz_av] via ORAL
  Filled 2016-11-11 (×9): qty 325.31

## 2016-11-11 NOTE — Progress Notes (Signed)
Spoke with pt and wife at bedside concerning discharge needs. [facility] was selected. Will need a face to face and [facility] LPH[facility] for DMA teaching and HHPT. Thanks

## 2016-11-11 NOTE — Progress Notes (Signed)
Progress Note  Patient Name: Fernando [name] Date of Encounter: 11/11/2016  Primary Cardiologist: Dr. Mayford Knifeurner  Subjective   Feeling better today.  SOB has improved  Inpatient Medications    Scheduled Meds: . allopurinol  100 mg Oral Q breakfast  . aspirin EC  81 mg Oral Daily  . donepezil  10 mg Oral Q breakfast  . finasteride  5 mg Oral Q breakfast  . furosemide  20 mg Intravenous BID  . heparin  5,000 Units Subcutaneous Q8H  . insulin aspart  0-15 Units Subcutaneous TID WC  . metoprolol tartrate  25 mg Oral BID  . sodium chloride flush  3 mL Intravenous Q12H  . sodium chloride flush  3 mL Intravenous Q12H  . vitamin B-12  1,000 mcg Oral Q supper   Continuous Infusions: . sodium chloride     PRN Meds: sodium chloride, acetaminophen **OR** acetaminophen, ondansetron **OR** ondansetron (ZOFRAN) IV, sodium chloride, sodium chloride flush, technetium TC 35M diethylenetriame-pentaacetic acid   Vital Signs    Vitals:   11/10/16 1436 11/10/16 2036 11/11/16 0500 11/11/16 0501  BP: (!) 147/56 (!) 116/59  (!) 120/55  Pulse: 83 89  75  Resp:    20  Temp: 98.8 F (37.1 C) 98.2 F (36.8 C)  98.2 F (36.8 C)  TempSrc: Oral Oral  Oral  SpO2: 98% 96%  100%  Weight:   (!) 304 lb 7.3 oz (138.1 kg)   Height:        Intake/Output Summary (Last 24 hours) at 11/11/16 0735 Last data filed at 11/10/16 1900  Gross per 24 hour  Intake             1120 ml  Output              700 ml  Net              420 ml   Filed Weights   11/09/16 1349 11/10/16 0500 11/11/16 0500  Weight: (!) 304 lb 10.8 oz (138.2 kg) (!) 305 lb 1.9 oz (138.4 kg) (!) 304 lb 7.3 oz (138.1 kg)    Telemetry    NSR - Personally Reviewed  ECG    No new EKG to review - Personally Reviewed  Physical Exam   GEN: No acute distress.   Neck: No JVD Cardiac: RRR, no murmurs, rubs, or gallops.  Respiratory: Clear to auscultation bilaterally. GI: Soft, nontender, non-distended  MS: 1+ LE edema; No  deformity. Neuro:  Nonfocal  Psych: Normal affect   Labs    Chemistry Recent Labs Lab 11/09/16 0859 11/10/16 0558  NA 140 148*  K 4.4 5.1  CL 103 101  CO2 31 35*  GLUCOSE 130* 122*  BUN 36* 32*  CREATININE 2.39* 2.34*  CALCIUM 8.9 9.8  PROT 6.8 6.7  ALBUMIN 3.4* 3.3*  AST 13* 14*  ALT 10* 11*  ALKPHOS 46 44  BILITOT 0.7 0.3  GFRNONAA 26* 26*  GFRAA 30* 31*  ANIONGAP 6 12     Hematology Recent Labs Lab 11/09/16 0859 11/10/16 0558  WBC 6.5 7.2  RBC 4.22 4.24  4.24  HGB 11.0* 11.0*  HCT 36.3* 37.7*  MCV 86.0 88.9  MCH 26.1 25.9*  MCHC 30.3 29.2*  RDW 15.2 15.2  PLT 208 206    Cardiac Enzymes Recent Labs Lab 11/09/16 2046 11/10/16 0558 11/10/16 1045  TROPONINI 0.06* 0.06* 0.06*    Recent Labs Lab 11/09/16 0900  TROPIPOC 0.01     BNP Recent Labs  Lab 11/09/16 0859  BNP 93.3     DDimer No results for input(s): DDIMER in the last 168 hours.   Radiology    Nm Pulmonary Perf And Vent  Result Date: 11/09/2016 CLINICAL DATA:  70 year old male with shortness of breath. EXAM: NUCLEAR MEDICINE VENTILATION - PERFUSION LUNG SCAN TECHNIQUE: Ventilation images were obtained in multiple projections using inhaled aerosol Tc-41m DTPA. Perfusion images were obtained in multiple projections after intravenous injection of Tc-35m MAA. RADIOPHARMACEUTICALS:  31.9 mCi Technetium-33m DTPA aerosol inhalation and 4.4 mCi Technetium-28m MAA IV COMPARISON:  11/09/2016 chest radiograph FINDINGS: Ventilation: Decreased ventilation within the left lower lung noted. Perfusion: Decreased perfusion within the left lower lung noted. No other significant perfusion abnormalities noted. IMPRESSION: Low probability for pulmonary embolus (10-19%). Moderate to large matching ventilation/perfusion defect within the left lower lobe. In correlation with recent chest radiograph, this probably represents left lower lung consolidation/airspace disease. Electronically Signed   By: Harmon Pier  M.D.   On: 11/09/2016 18:30   Dg Chest Port 1 View  Result Date: 11/10/2016 CLINICAL DATA:  Short of breath EXAM: PORTABLE CHEST 1 VIEW COMPARISON:  11/09/2016 FINDINGS: Pulmonary vascular congestion unchanged.  Negative for edema. Bibasilar airspace disease similar to the prior study, left greater than right negative for effusion IMPRESSION: Pulmonary vascular congestion unchanged Bibasilar airspace disease left greater than right unchanged Electronically Signed   By: Marlan Palau M.D.   On: 11/10/2016 12:33   Dg Chest Port 1 View  Result Date: 11/09/2016 CLINICAL DATA:  Shortness of breath EXAM: PORTABLE CHEST 1 VIEW COMPARISON:  01/29/2016 FINDINGS: Cardiomegaly and pulmonary vascular congestion. No air bronchogram or Kerley lines. No effusion or pneumothorax. Low lung volumes. IMPRESSION: Low volume chest with cardiomegaly and pulmonary vascular congestion. Electronically Signed   By: Marnee Spring M.D.   On: 11/09/2016 09:19    Cardiac Studies   none  Patient Profile     71 y.o. male with a history of DM, HTN, chronic venous insuff, CKD, morbid obesity but no history of cardiac disease, who presented to [facility] ER with complaints of DOE.  This has been occurring over several weeks.  He was seen by his nephrologist on 6/12 who recommended admission to ER.  ER workup at that time included normal CXRAY and EKG and he was sent home.  His symptoms worsened and he returned to the ER with hypoxia with O2 sats in the mid 80's and Cxray c/w acute CHF.  He denies any chest pain or pressure but feels he cannot take a deep breath in.  While in the ER he apparently went into SVT but spontaneously converted to NSR.  His BNP is normal but felt to be due to underlying obesity.   VQ scan was low prob for PE and no evidence of DVT on LE venous dopplers.  He was started on IV lasix with good diuresis.  Of note, he has CKD stage 4 with on ly 1 kidney s/p nephrotomy due to staghorn calculus.  His baseline creatinine  is around 2.2.  Troponin was found to be mildly elevated at 0.06 x 3 and flat.  Assessment & Plan    1. Probable acute diastolic CHF - echo 01/2016 with normal LVF.  ? Etiology of CHF. He has multiple episodes of SVT on tele and this may have been enough to put him into CHF.   Cxray with pulmonary venous congestion but no overt edema.  BNP is normal but can be falsely low in obesity.  Weight up 1lbs from yesterday (300>305>304) but ? Accuracy.   - Creatinine slightly improved yesterda from 2.39>2.34 and pending this am. He has mild LE edema with skin changes of chronic venous stasis. - He put out 700cc yesterday and is net positive 690cc. - continue IV diuretics at current dose for now until BMET back and if creatinine is stable consider increasing dose to promote better diuresis.  His weight is still up and still has LE edema. - follow renal function and lytes closely as sodium trending upward.  2.  SVT - he converted spontaneously to NSR but is having frequent episodes of SVT on tele that appear to be AVNRT.  His HR is in the 80's when in NSR.   - Tele reveals continued runs of SVT but much improved from yesterday - increase Lopressor to 25mg  TID and titrate as BP allows to suppress SVT.  3.  HTN  - BP improved after addition of BB  4.  Elevated troponin at 0.06 x 3, flat trend, and likely related to CHF, SVT and CKD stage 4. - 2D echo pending to make sure LVF intact.   - he is not a candidate for cath given CKD stage 4 and would be at high risk for progressing to ESRD needing HD if we proceeded with cath.  Therefore no further ischemic workup if LVF normal since he has no CP.  5.  Morbid obesity with CO2 retention on ABG.  Likely has obesity hypoventilation syndrome.   - recommend outpt sleep study - this could be triggering his arrhythmias.   Signed, Armanda Magic, MD  11/11/2016, 7:35 AM

## 2016-11-11 NOTE — Progress Notes (Signed)
PHYSICAL THERAPY  SATURATION QUALIFICATIONS: (This note is used to comply with regulatory documentation for home oxygen)  Patient Saturations on Room Air at Rest = 91%  Patient Saturations on Room Air while Ambulating = 83%  Patient Saturations on 2 Liters of oxygen while Ambulating 15 feet x 2  = 92%  Please briefly explain why patient needs home oxygen:  Pt required 2 lts supplemental O2 to achieve therapeutic level during activity  Felecia ShellingLori Molli Gethers  PTA [facility]  Acute  Rehab Pager      [phone]

## 2016-11-11 NOTE — Progress Notes (Signed)
PROGRESS NOTE [facility]   IZEAH VOSSLER   ONG:[id number] DOB: [birth year]  DOA: 11/09/2016 [facility]CP: [facility]   Brief Narrative:  Fernando [name] is a 71 year old male with past medical history of diabetes, hypertension, chronic venous insufficiency, chronic kidney disease, morbidly obese and a long kidney presented to the emergency department complaining of shortness of breath. This started a month ago and has progressively worsened over the past few weeks prior to admission. He was seen by his nephrologist on 11/08/16 due to shortness of breath and recommended ED evaluation. He underwent EKG and chest x-ray with unremarkable results and patient was discharged home from EP. Symptoms persisted and he came to the emergency department where he was noted to be hypoxic in the mid 80s, he was placed in oxygen with some improvement. Chest x-ray showed increasing vascular congestion. Patient was admitted for possible CHF and further management.  Subjective: Patient seen and examined, SOB has improved. No acute events overnight.   Assessment & Plan: Acute respiratory failure with hypoxia - unclear etiology at this time, CXR show pulmonary vascular congestion, no Hx of CHF On physical exam, breath sound decrease throughout, but no crackles or wheezing or rales noted. Suspect new onset of CHF, normal BNP likely due to obesity, has been diuresing well with IV Lasix  Low prob for PE per VQ scan,  no DVT on doppler I will increase Lasix to 40 mg IV BID for better diuresis as sCr remains stable  I&O's positive ? Accuracy  Echocardiogram still pending Monitor I&O's, low salt diet, daily weights  Wean off O2 as tolerated  Will get ambulatory O2 sat  Will add TED hose  Acute renal failure on CKD 4 - Improving  Only have 1 kidney, as he is s/p nephrotomy due to staghorn calculus  Likely cardiorenal syndrome - sCr expect to improve with diuresis  Cr close to apparent baseline  2.20 Continue IV Lasix for now  Check Cr in AM     DM type 2  CBG's stable Placed on SSI  Continue to monitor  SVT with mild elevated troponin TNI likely from demand ischemia  On and off from SVT  Cardiology consulted - patient started on Lopressor 25 mg TID   HTN - BP stable  Continue current regimen  Monitor BP   Morbid obesity - BMI > 41.4 Nutrition consult   Normocytic anemia  Hgb stable  Continue to monitor   DVT prophylaxis: Heparin subcutaneous Code Status: Full code Family Communication: Wife at bedside Disposition Plan: Home with HHPT when medically stable   Consultants:   None  Procedures:   Echo pending  Antimicrobials: Antibiotics Given (last 72 hours)    None      Objective: Vitals:   11/10/16 1436 11/10/16 2036 11/11/16 0500 11/11/16 0501  BP: (!) 147/56 (!) 116/59  (!) 120/55  Pulse: 83 89  75  Resp:    20  Temp: 98.8 F (37.1 C) 98.2 F (36.8 C)  98.2 F (36.8 C)  TempSrc: Oral Oral  Oral  SpO2: 98% 96%  100%  Weight:   (!) 138.1 kg (304 lb 7.3 oz)   Height:        Intake/Output Summary (Last 24 hours) at 11/11/16 1437 Last data filed at 11/11/16 1135  Gross per 24 hour  Intake              800 ml  Output  950 ml  Net             -150 ml   Filed Weights   11/09/16 1349 11/10/16 0500 11/11/16 0500  Weight: (!) 138.2 kg (304 lb 10.8 oz) (!) 138.4 kg (305 lb 1.9 oz) (!) 138.1 kg (304 lb 7.3 oz)    Examination:  General: Pt is alert, awake, not in acute distress Cardiovascular: RRR, S1/S2 +, no rubs, no gallops Respiratory: Air entry improved, no crackles or rale  Abdominal: Soft, NT, ND, bowel sounds + Extremities:  LE edema 1+, chronic venous stasis changes, no cyanosis  Data Reviewed: I have personally reviewed following labs and imaging studies  CBC:  Recent Labs Lab 11/09/16 0859 11/10/16 0558 11/11/16 0729  WBC 6.5 7.2 7.5  NEUTROABS 4.7  --   --   HGB 11.0* 11.0* 10.8*  HCT 36.3* 37.7* 36.6*   MCV 86.0 88.9 88.6  PLT 208 206 189   Basic Metabolic Panel:  Recent Labs Lab 11/09/16 0859 11/10/16 0558 11/11/16 0729  NA 140 148* 141  K 4.4 5.1 5.0  CL 103 101 103  CO2 31 35* 30  GLUCOSE 130* 122* 126*  BUN 36* 32* 31*  CREATININE 2.39* 2.34* 2.23*  CALCIUM 8.9 9.8 8.7*   GFR: Estimated Creatinine Clearance: 43.7 mL/min (A) (by C-G formula based on SCr of 2.23 mg/dL (H)). Liver Function Tests:  Recent Labs Lab 11/09/16 0859 11/10/16 0558  AST 13* 14*  ALT 10* 11*  ALKPHOS 46 44  BILITOT 0.7 0.3  PROT 6.8 6.7  ALBUMIN 3.4* 3.3*   Cardiac Enzymes:  Recent Labs Lab 11/09/16 2046 11/10/16 0558 11/10/16 1045 11/11/16 0729  TROPONINI 0.06* 0.06* 0.06* 0.06*   CBG:  Recent Labs Lab 11/10/16 1150 11/10/16 1716 11/10/16 2107 11/11/16 0748 11/11/16 1210  GLUCAP 149* 154* 137* 120* 121*   Anemia Panel:  Recent Labs  11/10/16 0558  VITAMINB12 1,630*  FOLATE 18.0  FERRITIN 29  TIBC 344  IRON 41*  RETICCTPCT 1.1   Sepsis Labs: No results for input(s): PROCALCITON, LATICACIDVEN in the last 168 hours.  No results found for this or any previous visit (from the past 240 hour(s)).    Radiology Studies: Nm Pulmonary Perf And Vent  Result Date: 11/09/2016 CLINICAL DATA:  71 year old male with shortness of breath. EXAM: NUCLEAR MEDICINE VENTILATION - PERFUSION LUNG SCAN TECHNIQUE: Ventilation images were obtained in multiple projections using inhaled aerosol Tc-67m DTPA. Perfusion images were obtained in multiple projections after intravenous injection of Tc-70m MAA. RADIOPHARMACEUTICALS:  31.9 mCi Technetium-23m DTPA aerosol inhalation and 4.4 mCi Technetium-8m MAA IV COMPARISON:  11/09/2016 chest radiograph FINDINGS: Ventilation: Decreased ventilation within the left lower lung noted. Perfusion: Decreased perfusion within the left lower lung noted. No other significant perfusion abnormalities noted. IMPRESSION: Low probability for pulmonary embolus  (10-19%). Moderate to large matching ventilation/perfusion defect within the left lower lobe. In correlation with recent chest radiograph, this probably represents left lower lung consolidation/airspace disease. Electronically Signed   By: Harmon Pier M.D.   On: 11/09/2016 18:30   Dg Chest Port 1 View  Result Date: 11/10/2016 CLINICAL DATA:  Short of breath EXAM: PORTABLE CHEST 1 VIEW COMPARISON:  11/09/2016 FINDINGS: Pulmonary vascular congestion unchanged.  Negative for edema. Bibasilar airspace disease similar to the prior study, left greater than right negative for effusion IMPRESSION: Pulmonary vascular congestion unchanged Bibasilar airspace disease left greater than right unchanged Electronically Signed   By: Marlan Palau M.D.   On: 11/10/2016 12:33  Scheduled Meds: . allopurinol  100 mg Oral Q breakfast  . aspirin EC  81 mg Oral Daily  . donepezil  10 mg Oral Q breakfast  . finasteride  5 mg Oral Q breakfast  . furosemide  20 mg Intravenous BID  . heparin  5,000 Units Subcutaneous Q8H  . insulin aspart  0-15 Units Subcutaneous TID WC  . metoprolol tartrate  25 mg Oral Q8H  . protein supplement shake  11 oz Oral BID BM  . sodium chloride flush  3 mL Intravenous Q12H  . sodium chloride flush  3 mL Intravenous Q12H  . vitamin B-12  1,000 mcg Oral Q supper   Continuous Infusions: . sodium chloride       LOS: 2 days    Time spent: Total of 25 minutes spent with pt, greater than 50% of which was spent in discussion of  treatment, counseling and coordination of care    Latrelle DodrillEdwin Silva, MD Pager: Text Page via [email or web address]  [phone]  If 7PM-7AM, please contact night-coverage [email or web address] Password [email or web address] 11/11/2016, 2:37 PM

## 2016-11-11 NOTE — Evaluation (Signed)
Occupational Therapy Evaluation Patient Details Name: Fernando [name] MRN: [id number] DOB: 05/12/1946 Today's Date: 11/11/2016    History of Present Illness Fernando [name] is a 71 y.o. male with a past medical history of diabetes on oral agents, hypertension, lower extremity edema, lone kidney, chronic kidney disease unknown stage, morbid obesity, who was in his usual state of health till about 2-3 weeks ago when he started getting short of breath   Clinical Impression   Pt was admitted for the above.  He has had a few week decline in status, and wife reports that he is not very active at baseline.  Will follow in acute setting with supervision to min A level goals     Follow Up Recommendations  Home health OT;Supervision/Assistance - 24 hour    Equipment Recommendations  None recommended by OT (pt has a high commode at home)    Recommendations for Other Services       Precautions / Restrictions Precautions Precautions: Fall Precaution Comments: monitor 02 Restrictions Weight Bearing Restrictions: No      Mobility Bed Mobility Overal bed mobility: Needs Assistance Bed Mobility: Supine to Sit     Supine to sit: Min assist;HOB elevated (use of rail) Sit to supine: Min assist;Mod assist (assist for legs)   General bed mobility comments: required increased assist esp upper body due to BMI and pt stated he sleeps in a recliner  Transfers Overall transfer level: Needs assistance Equipment used: Rolling walker (2 wheeled) Transfers: Sit to/from [employer] Sit to Stand: Min guard Stand pivot transfers: Min guard;Min assist       General transfer comment: cues for safety    Balance                                           ADL either performed or assessed with clinical judgement   ADL Overall ADL's : Needs assistance/impaired     Grooming: Supervision/safety;Standing   Upper Body Bathing: Set up;Sitting   Lower Body  Bathing: Moderate assistance;Sit to/from stand   Upper Body Dressing : Minimal assistance;Sitting (lines)   Lower Body Dressing: Maximal assistance;Sit to/from stand                 General ADL Comments: worked on bathing as he had limited bath this am.  Sidestepped up [facility] with min guard A.  Pt has a high commode and vanity. Wife does not think 3;1 will fit in the bathroom. Pt will sponge bathe initially:  he has a tub     Vision         Perception     Praxis      Pertinent Vitals/Pain Pain Assessment: No/denies pain     Hand Dominance     Extremity/Trunk Assessment Upper Extremity Assessment Upper Extremity Assessment: Generalized weakness (grossly 3-/5 bil shoulders; L FF 110; R FF 130 AAROM)           Communication Communication Communication: No difficulties   Cognition Arousal/Alertness: Awake/alert Behavior During Therapy: WFL for tasks assessed/performed Overall Cognitive Status: Within Functional Limits for tasks assessed                                     General Comments       Exercises     Shoulder Instructions  Home Living Family/patient expects to be discharged to:: Private residence Living Arrangements: Spouse/significant other Available Help at Discharge: Family               Bathroom Shower/Tub: Chief Strategy OfficerTub/shower unit   Bathroom Toilet: Handicapped height (vanity next to commode)         Additional Comments: sock aide, reacher      Prior Functioning/Environment      ADL's / Homemaking Assistance Needed:  (wife has been helping with LB adls due to obesity.  Pt has A)   Comments: pt not very active at baseline        OT Problem List: Decreased strength;Decreased activity tolerance;Impaired balance (sitting and/or standing);Decreased knowledge of use of DME or AE;Cardiopulmonary status limiting activity;Obesity      OT Treatment/Interventions: Self-care/ADL training;Energy conservation;DME and/or AE  instruction;Patient/family education;Balance training;Therapeutic exercise;Therapeutic activities    OT Goals(Current goals can be found in the care plan section) Acute Rehab OT Goals Patient Stated Goal: none stated OT Goal Formulation: With patient Time For Goal Achievement: 11/18/16 Potential to Achieve Goals: Good ADL Goals Pt Will Transfer to Toilet: with min guard assist;ambulating (high commode with vanity) Pt Will Perform Toileting - Clothing Manipulation and hygiene: sit to/from stand;with supervision Additional ADL Goal #2: pt will complete LB adls with AE with min A sit to stand (reacher and sock aide) Additional ADL Goal #3: pt will perform 2 sets of 5 AROM for shoulder flexion to increase strength for adls  OT Frequency: Min 2X/week   Barriers to D/C:            Co-evaluation              AM-PAC PT "6 Clicks" Daily Activity     Outcome Measure Help from another person eating meals?: None Help from another person taking care of personal grooming?: A Little Help from another person toileting, which includes using toliet, bedpan, or urinal?: A Little Help from another person bathing (including washing, rinsing, drying)?: A Lot Help from another person to put on and taking off regular upper body clothing?: A Little Help from another person to put on and taking off regular lower body clothing?: A Lot 6 Click Score: 17   End of Session    Activity Tolerance: Patient tolerated treatment well Patient left: in bed;with call bell/phone within reach;with bed alarm set;with family/visitor present  OT Visit Diagnosis: Unsteadiness on feet (R26.81);Muscle weakness (generalized) (M62.81)                Time: [id number] OT Time Calculation (min): 26 min Charges:  OT General Charges $OT Visit: 1 Procedure OT Evaluation $OT Eval Moderate Complexity: 1 Procedure OT Treatments $Self Care/Home Management : 8-22 mins G-Codes:     [place] PuenteMaryellen Charnika Herbst,  OTR/L [phone] 11/11/2016  Fernando [name] 11/11/2016, 2:30 PM

## 2016-11-11 NOTE — Progress Notes (Signed)
  Echocardiogram 2D Echocardiogram has been performed.  Arvil ChacoFoster, Aaron Bostwick 11/11/2016, 3:05 PM

## 2016-11-11 NOTE — Progress Notes (Signed)
Pt's home address is [address] [place], [place] [address].

## 2016-11-11 NOTE — Progress Notes (Signed)
Nutrition Education Note  RD consulted for nutrition education regarding a Heart Healthy/Diabetic diet.   RD provided "Heart Healthy Nutrition Therapy" handout from the Academy of Nutrition and Dietetics. Reviewed patient's dietary recall. Provided examples on ways to decrease sodium and fat intake in diet. Discouraged intake of processed foods and use of salt shaker. Encouraged fresh fruits and vegetables as well as whole grain sources of carbohydrates to maximize fiber intake.   RD provided "Nutirtion Therapy with Type II Diabetes" handout from the Academy of Nutrition and Dietetics. Discussed different food groups and their effects on blood sugar, emphasizing carbohydrate-containing foods. Provided list of carbohydrates and recommended serving sizes of common foods.  Discussed importance of controlled and consistent carbohydrate intake throughout the day. Provided examples of ways to balance meals/snacks and encouraged intake of high-fiber, whole grain complex carbohydrates.   Teach back method used.  Expect good compliance.  Body mass index is 41.29 kg/m.   Current diet order is carbohydrate controlled, patient is consuming approximately 100% of meals at this time. Labs and medications reviewed.   RD will order Premier Protein to help pt meet estimated needs as pt in morbidly obese.  No further nutrition interventions warranted at this time. RD contact information provided. If additional nutrition issues arise, please re-consult RD.  Betsey Holidayasey Georgenia Salim MS, RD, LDN Pager #- [phone]

## 2016-11-11 NOTE — Progress Notes (Signed)
Physical Therapy Treatment Patient Details Name: Fernando GeninRichard A Vanderhoef MRN: [id number] DOB: Oct 21, 1945 Today's Date: 11/11/2016    History of Present Illness Fernando GeninRichard A Granier is a 71 y.o. male with a past medical history of diabetes on oral agents, hypertension, lower extremity edema, lone kidney, chronic kidney disease unknown stage, morbid obesity, who was in his usual state of health till about 2-3 weeks ago when he started getting short of breath    PT Comments    Pt in bed on 2 L at 98%.  Removed oxygen for trial.  Assisted OOB to amb to bathroom with static standing at sink x 6 min.  RA decreased to 83% reapplied 2 lts to achieve 90% or >.  Assisted with amb back to bed.  Follow Up Recommendations  Home health PT;Supervision for mobility/OOB (OXYGEN)     Equipment Recommendations  Rolling walker with 5" wheels (spouse stated she could borrow one )    Recommendations for Other Services       Precautions / Restrictions Precautions Precautions: Fall    Mobility  Bed Mobility Overal bed mobility: Needs Assistance Bed Mobility: Supine to Sit     Supine to sit: Mod assist;Max assist     General bed mobility comments: required increased assist esp upper body due to BMI and pt stated he sleeps in a recliner  Transfers Overall transfer level: Needs assistance Equipment used: Rolling walker (2 wheeled) Transfers: Sit to/from [employer];Stand Pivot Transfers Sit to Stand: Min guard;Min assist Stand pivot transfers: Min guard;Min assist       General transfer comment: pt uses forward momentum rocking to stand and tends to sit uncontrolled  Ambulation/Gait Ambulation/Gait assistance: Supervision;Min guard Ambulation Distance (Feet): 30 Feet (15 feet x 2 to and from bathroom) Assistive device: Rolling walker (2 wheeled) Gait Pattern/deviations: Step-through pattern;Wide base of support;Decreased step length - left;Decreased stance time - right Gait velocity: decreased     General Gait Details: amb from bed to bathroom.  Static standing at sink to brush teeth x 6 min RA decreased to 83%   Stairs            Wheelchair Mobility    Modified Rankin (Stroke Patients Only)       Balance                                            Cognition Arousal/Alertness: Awake/alert Behavior During Therapy: WFL for tasks assessed/performed Overall Cognitive Status: Within Functional Limits for tasks assessed                                        Exercises      General Comments        Pertinent Vitals/Pain Pain Assessment: No/denies pain    Home Living                      Prior Function            PT Goals (current goals can now be found in the care plan section) Progress towards PT goals: Progressing toward goals    Frequency    Min 3X/week      PT Plan Current plan remains appropriate    Co-evaluation  AM-PAC PT "6 Clicks" Daily Activity  Outcome Measure  Difficulty turning over in bed (including adjusting bedclothes, sheets and blankets)?: Total Difficulty moving from lying on back to sitting on the side of the bed? : Total Difficulty sitting down on and standing up from a chair with arms (e.g., wheelchair, bedside commode, etc,.)?: Total Help needed moving to and from a bed to chair (including a wheelchair)?: A Little Help needed walking in hospital room?: A Little Help needed climbing 3-5 steps with a railing? : A Little 6 Click Score: 12    End of Session Equipment Utilized During Treatment: Gait belt Activity Tolerance: Patient limited by fatigue Patient left: in bed;with call bell/phone within reach;with family/visitor present Nurse Communication: Mobility status PT Visit Diagnosis: Unsteadiness on feet (R26.81)     Time: 1135-1200 PT Time Calculation (min) (ACUTE ONLY): 25 min  Charges:  $Gait Training: 8-22 mins $Therapeutic Activity: 8-22 mins                     G Codes:      Felecia Shelling  PTA [facility]  Acute  Rehab Pager      [phone]

## 2016-11-12 LAB — GLUCOSE, CAPILLARY
GLUCOSE-CAPILLARY: 134 mg/dL — AB (ref 65–99)
Glucose-Capillary: 142 mg/dL — ABNORMAL HIGH (ref 65–99)
Glucose-Capillary: 123 mg/dL — ABNORMAL HIGH (ref 65–99)

## 2016-11-12 LAB — CBC WITH DIFFERENTIAL/PLATELET
Basophils Absolute: 0 10*3/uL (ref 0.0–0.1)
Basophils Relative: 0 %
Eosinophils Absolute: 0.2 10*3/uL (ref 0.0–0.7)
Eosinophils Relative: 3 %
HCT: 38.2 % — ABNORMAL LOW (ref 39.0–52.0)
HEMOGLOBIN: 11.3 g/dL — AB (ref 13.0–17.0)
LYMPHS ABS: 1.7 10*3/uL (ref 0.7–4.0)
LYMPHS PCT: 23 %
MCH: 26.2 pg (ref 26.0–34.0)
MCHC: 29.6 g/dL — AB (ref 30.0–36.0)
MCV: 88.4 fL (ref 78.0–100.0)
MONOS PCT: 7 %
Monocytes Absolute: 0.5 10*3/uL (ref 0.1–1.0)
Neutro Abs: 4.9 10*3/uL (ref 1.7–7.7)
Neutrophils Relative %: 67 %
Platelets: 217 10*3/uL (ref 150–400)
RBC: 4.32 MIL/uL (ref 4.22–5.81)
RDW: 14.5 % (ref 11.5–15.5)
WBC: 7.3 10*3/uL (ref 4.0–10.5)

## 2016-11-12 LAB — BASIC METABOLIC PANEL
Anion gap: 10 (ref 5–15)
BUN: 31 mg/dL — AB (ref 6–20)
CHLORIDE: 98 mmol/L — AB (ref 101–111)
CO2: 33 mmol/L — ABNORMAL HIGH (ref 22–32)
Calcium: 9 mg/dL (ref 8.9–10.3)
Creatinine, Ser: 2.06 mg/dL — ABNORMAL HIGH (ref 0.61–1.24)
GFR calc Af Amer: 36 mL/min — ABNORMAL LOW (ref >60)
GFR calc non Af Amer: 31 mL/min — ABNORMAL LOW (ref >60)
GLUCOSE: 136 mg/dL — AB (ref 65–99)
POTASSIUM: 4.7 mmol/L (ref 3.5–5.1)
Sodium: 141 mmol/L (ref 135–145)

## 2016-11-12 MED ORDER — METOPROLOL TARTRATE 25 MG PO TABS
25.0000 mg | ORAL_TABLET | Freq: Once | ORAL | Status: AC
  Administered 2016-11-12: 25 mg via ORAL

## 2016-11-12 MED ORDER — METOPROLOL TARTRATE 50 MG PO TABS
50.0000 mg | ORAL_TABLET | Freq: Two times a day (BID) | ORAL | Status: DC
  Administered 2016-11-12 – 2016-11-15 (×6): 50 mg via ORAL
  Filled 2016-11-12 (×6): qty 1

## 2016-11-12 NOTE — Progress Notes (Signed)
Pt becomes SOB during ambulation and during transfer from bed to chair. Wife devoted and remains at bedside, wife will need support if the plan is to return home. Pt has low endurance. Sats decreased when transfer to bed to chair 87-86%, then stable to 91-92% at rest, low ventilation. Enc pt to take deep breathes. SRP, RN

## 2016-11-12 NOTE — Progress Notes (Addendum)
PROGRESS NOTE [facility]   GARRIE WOODIN   ZOX:[id number] DOB: Jul 07, 1945  DOA: 11/09/2016 PCP: [facility]   Brief Narrative:  Fernando [name] is a 71 year old male with past medical history of diabetes, hypertension, chronic venous insufficiency, chronic kidney disease, morbidly obese and a long kidney presented to the emergency department complaining of shortness of breath. This started a month ago and has progressively worsened over the past few weeks prior to admission. He was seen by his nephrologist on 11/08/16 due to shortness of breath and recommended ED evaluation. He underwent EKG and chest x-ray with unremarkable results and patient was discharged home from EP. Symptoms persisted and he came to the emergency department where he was noted to be hypoxic in the mid 80s, he was placed in oxygen with some improvement. Chest x-ray showed increasing vascular congestion. Patient was admitted for possible CHF and further management. ECHO was done showed normal LVEF 55-60%, no able to eval diastolic dysfunction.  Subjective: Patient seen and examined, continues to do well, breathing has improved and he is off oxygen.   Assessment & Plan: Acute respiratory failure with hypoxia - 2/2 acute diastolic HF  See below   Acute diastolic HF  Initial presentation: CXR show pulmonary vascular congestion, on physical exam, breath sound decrease throughout, but no crackles or wheezing or rales noted. Normal BNP likely due to obesity. SVT likely triggered CHF episode  Been treated with IV Lasix responding well, good UOP  Low prob for PE per VQ scan,  no DVT on doppler Continue with IV Lasix 40 mg BID  Yesterday negative 3.3L  Monitor I&O's, low salt diet, daily weights  Wean off O2 as tolerated  Will get ambulatory O2 sat in the morning  TED hose  Acute renal failure on CKD 4 - Improving  Only have 1 kidney, as he is s/p nephrotomy due to staghorn calculus  Likely  cardiorenal syndrome - venous congestion sCr continues to improve  Will continue with IV lasix  Monitor BMP in AM   DM type 2  CBG's remains stable  Continue SSI  Continue to monitor  PSVT with mild elevated troponin TNI likely from demand ischemia - troponin has been flat, CKD also contributing  Cardiology consulted - patient started on Lopressor 25 mg TID adjusted to 50 mg BID No further ischemic workup indicated per cardiology   HTN - BP remains stable  Continue current regimen  Monitor BP   Morbid obesity - BMI > 41.4 Nutrition consult   Normocytic anemia  Hgb stable   DVT prophylaxis: Heparin subcutaneous Code Status: Full code Family Communication: Wife at bedside Disposition Plan: Home with HHPT when medically stable   Consultants:   None  Procedures:   Echo pending  Antimicrobials: Antibiotics Given (last 72 hours)    None      Objective: Vitals:   11/11/16 2029 11/11/16 2358 11/12/16 0500 11/12/16 0621  BP: (!) 128/53 124/60  137/72  Pulse: 67 72  84  Resp: 20   18  Temp: 98.3 F (36.8 C)   98.5 F (36.9 C)  TempSrc: Oral   Oral  SpO2: 98%   97%  Weight:   135.7 kg (299 lb 2.6 oz)   Height:        Intake/Output Summary (Last 24 hours) at 11/12/16 1255 Last data filed at 11/12/16 0900  Gross per 24 hour  Intake              240  ml  Output             2200 ml  Net            -1960 ml   Filed Weights   11/10/16 0500 11/11/16 0500 11/12/16 0500  Weight: (!) 138.4 kg (305 lb 1.9 oz) (!) 138.1 kg (304 lb 7.3 oz) 135.7 kg (299 lb 2.6 oz)    Examination:  General: NAD off O2 sitting in chair  Cardiovascular: RRR no murmurs  Respiratory: Air entry continues to improve, no crackles or rales  Abdominal: Soft, NT, ND, bowel sounds + Extremities: LE trace edema, chronic venous stasis changes, non tender to palpation   Data Reviewed: I have personally reviewed following labs and imaging studies  CBC:  Recent Labs Lab 11/09/16 0859  11/10/16 0558 11/11/16 0729 11/12/16 0436  WBC 6.5 7.2 7.5 7.3  NEUTROABS 4.7  --   --  4.9  HGB 11.0* 11.0* 10.8* 11.3*  HCT 36.3* 37.7* 36.6* 38.2*  MCV 86.0 88.9 88.6 88.4  PLT 208 206 189 217   Basic Metabolic Panel:  Recent Labs Lab 11/09/16 0859 11/10/16 0558 11/11/16 0729 11/12/16 0436  NA 140 148* 141 141  K 4.4 5.1 5.0 4.7  CL 103 101 103 98*  CO2 31 35* 30 33*  GLUCOSE 130* 122* 126* 136*  BUN 36* 32* 31* 31*  CREATININE 2.39* 2.34* 2.23* 2.06*  CALCIUM 8.9 9.8 8.7* 9.0   GFR: Estimated Creatinine Clearance: 46.9 mL/min (A) (by C-G formula based on SCr of 2.06 mg/dL (H)). Liver Function Tests:  Recent Labs Lab 11/09/16 0859 11/10/16 0558  AST 13* 14*  ALT 10* 11*  ALKPHOS 46 44  BILITOT 0.7 0.3  PROT 6.8 6.7  ALBUMIN 3.4* 3.3*   Cardiac Enzymes:  Recent Labs Lab 11/09/16 2046 11/10/16 0558 11/10/16 1045 11/11/16 0729  TROPONINI 0.06* 0.06* 0.06* 0.06*   CBG:  Recent Labs Lab 11/11/16 1210 11/11/16 1710 11/11/16 2256 11/12/16 0804 11/12/16 1206  GLUCAP 121* 118* 123* 142* 123*   Anemia Panel:  Recent Labs  11/10/16 0558  VITAMINB12 1,630*  FOLATE 18.0  FERRITIN 29  TIBC 344  IRON 41*  RETICCTPCT 1.1    Radiology Studies: No results found.  Scheduled Meds: . allopurinol  100 mg Oral Q breakfast  . aspirin EC  81 mg Oral Daily  . donepezil  10 mg Oral Q breakfast  . finasteride  5 mg Oral Q breakfast  . furosemide  40 mg Intravenous BID  . heparin  5,000 Units Subcutaneous Q8H  . insulin aspart  0-15 Units Subcutaneous TID WC  . metoprolol tartrate  50 mg Oral BID  . protein supplement shake  11 oz Oral BID BM  . sodium chloride flush  3 mL Intravenous Q12H  . sodium chloride flush  3 mL Intravenous Q12H  . vitamin B-12  1,000 mcg Oral Q supper   Continuous Infusions: . sodium chloride       LOS: 3 days    Time spent: Total of 25 minutes spent with pt, greater than 50% of which was spent in discussion of   treatment, counseling and coordination of care   Latrelle DodrillEdwin Silva, MD Pager: Text Page via [email or web address]  [phone]  If 7PM-7AM, please contact night-coverage [email or web address] Password [email or web address] 11/12/2016, 12:55 PM

## 2016-11-12 NOTE — Progress Notes (Signed)
Occupational Therapy Treatment Patient Details Name: Fernando [name]: [id number] DOB: [birth year] Today's Date: 11/12/2016    History of present illness Fernando [name] is a 71 y.o. male with a past medical history of diabetes on oral agents, hypertension, lower extremity edema, lone kidney, chronic kidney disease unknown stage, morbid obesity, who was in his usual state of health till about 2-3 weeks ago when he started getting short of breath   OT comments  Pt sat on EOB X approximately 10 minutes with O2 at 90-92% on RA. With transfer over to recliner with RW on RA pt's O2 sats decreased to 86%. Replaced O2 at 2L and encouraged PLB and sats increased to 94%.  Wife present for session. Discussed AE options including reacher for LB dressing and DME of a tubbench to help with bathing at home. Educated on energy conservation strategies for ADL.  Follow Up Recommendations  Home health OT;Supervision/Assistance - 24 hour    Equipment Recommendations  None recommended by OT    Recommendations for Other Services      Precautions / Restrictions Precautions Precautions: Fall Precaution Comments: monitor 02 Restrictions Weight Bearing Restrictions: No       Mobility Bed Mobility Overal bed mobility: Needs Assistance Bed Mobility: Supine to Sit     Supine to sit: Min guard;HOB elevated        Transfers Overall transfer level: Needs assistance Equipment used: Rolling walker (2 wheeled) Transfers: Sit to/from Stand Sit to Stand: Min guard         General transfer comment: cues for safety and keeping walker closer    Balance                                           ADL either performed or assessed with clinical judgement   ADL                       Lower Body Dressing: Minimal assistance;Sitting/lateral leans;With adaptive equipment                 General ADL Comments: pt used reacher to don LB pants simulated with pillowcase.  Wife states pt has used a Sports administrator to don pants in the past but not most recently. She has to assist with his compression hose at home. He has also used a long handle sponge in the past but doesnt have one currently. Discussed obtaining one to help with bathing. Wife states she has a friend that can loan him a tub transfer bench. Discussed progressing bathing activity since he fatigues and O2 drops-discussed sponge bathing initially at home and progressing to using tub transfer bench.      Vision Patient Visual Report: No change from baseline     Perception     Praxis      Cognition Arousal/Alertness: Awake/alert Behavior During Therapy: WFL for tasks assessed/performed Overall Cognitive Status: Within Functional Limits for tasks assessed                                          Exercises     Shoulder Instructions       General Comments      Pertinent Vitals/ Pain       Pain Assessment: No/denies pain  Home  Living                                          Prior Functioning/Environment              Frequency  Min 2X/week        Progress Toward Goals  OT Goals(current goals can now be found in the care plan section)  Progress towards OT goals: Progressing toward goals     Plan      Co-evaluation                 AM-PAC PT "6 Clicks" Daily Activity     Outcome Measure   Help from another person eating meals?: None Help from another person taking care of personal grooming?: A Little Help from another person toileting, which includes using toliet, bedpan, or urinal?: A Little Help from another person bathing (including washing, rinsing, drying)?: A Lot Help from another person to put on and taking off regular upper body clothing?: A Little Help from another person to put on and taking off regular lower body clothing?: A Lot 6 Click Score: 17    End of Session Equipment Utilized During Treatment: Oxygen;Rolling  walker  OT Visit Diagnosis: Muscle weakness (generalized) (M62.81);Unsteadiness on feet (R26.81)   Activity Tolerance Patient tolerated treatment well   Patient Left in chair;with call bell/phone within reach;with family/visitor present   Nurse Communication          Time: 1445-1520 OT Time Calculation (min): 35 min  Charges: OT General Charges $OT Visit: 1 Procedure OT Treatments $Self Care/Home Management : 8-22 mins $Therapeutic Activity: 8-22 mins     Zannie KehrStephanie S Sekai Gitlin 11/12/2016, 3:44 PM  [phone]

## 2016-11-12 NOTE — Progress Notes (Signed)
Progress Note  Patient Name: Fernando [name] Date of Encounter: 11/12/2016    Subjective   Breathing is improving. No palpitations.   Inpatient Medications    Scheduled Meds: . allopurinol  100 mg Oral Q breakfast  . aspirin EC  81 mg Oral Daily  . donepezil  10 mg Oral Q breakfast  . finasteride  5 mg Oral Q breakfast  . furosemide  40 mg Intravenous BID  . heparin  5,000 Units Subcutaneous Q8H  . insulin aspart  0-15 Units Subcutaneous TID WC  . metoprolol tartrate  25 mg Oral Q8H  . protein supplement shake  11 oz Oral BID BM  . sodium chloride flush  3 mL Intravenous Q12H  . sodium chloride flush  3 mL Intravenous Q12H  . vitamin B-12  1,000 mcg Oral Q supper   Continuous Infusions: . sodium chloride     PRN Meds: sodium chloride, acetaminophen **OR** acetaminophen, ondansetron **OR** ondansetron (ZOFRAN) IV, sodium chloride, sodium chloride flush, technetium TC 60M diethylenetriame-pentaacetic acid   Vital Signs    Vitals:   11/11/16 2029 11/11/16 2358 11/12/16 0500 11/12/16 0621  BP: (!) 128/53 124/60  137/72  Pulse: 67 72  84  Resp: 20   18  Temp: 98.3 F (36.8 C)   98.5 F (36.9 C)  TempSrc: Oral   Oral  SpO2: 98%   97%  Weight:   299 lb 2.6 oz (135.7 kg)   Height:        Intake/Output Summary (Last 24 hours) at 11/12/16 0847 Last data filed at 11/12/16 0500  Gross per 24 hour  Intake              120 ml  Output             3300 ml  Net            -3180 ml   Filed Weights   11/10/16 0500 11/11/16 0500 11/12/16 0500  Weight: (!) 305 lb 1.9 oz (138.4 kg) (!) 304 lb 7.3 oz (138.1 kg) 299 lb 2.6 oz (135.7 kg)    Telemetry    SR and PSVT - Personally Reviewed  ECG      Physical Exam   GEN: No acute distress.   Neck: No JVD Cardiac: RRR, no murmurs, rubs, or gallops.  Respiratory: Clear to auscultation bilaterally. GI: Soft, nontender, non-distended  MS: No edema; No deformity. Neuro:  Nonfocal  Psych: Normal affect   Labs      Chemistry Recent Labs Lab 11/09/16 0859 11/10/16 0558 11/11/16 0729 11/12/16 0436  NA 140 148* 141 141  K 4.4 5.1 5.0 4.7  CL 103 101 103 98*  CO2 31 35* 30 33*  GLUCOSE 130* 122* 126* 136*  BUN 36* 32* 31* 31*  CREATININE 2.39* 2.34* 2.23* 2.06*  CALCIUM 8.9 9.8 8.7* 9.0  PROT 6.8 6.7  --   --   ALBUMIN 3.4* 3.3*  --   --   AST 13* 14*  --   --   ALT 10* 11*  --   --   ALKPHOS 46 44  --   --   BILITOT 0.7 0.3  --   --   GFRNONAA 26* 26* 28* 31*  GFRAA 30* 31* 32* 36*  ANIONGAP 6 12 8 10      Hematology Recent Labs Lab 11/10/16 0558 11/11/16 0729 11/12/16 0436  WBC 7.2 7.5 7.3  RBC 4.24  4.24 4.13* 4.32  HGB 11.0* 10.8* 11.3*  HCT 37.7* 36.6* 38.2*  MCV 88.9 88.6 88.4  MCH 25.9* 26.2 26.2  MCHC 29.2* 29.5* 29.6*  RDW 15.2 14.8 14.5  PLT 206 189 217    Cardiac Enzymes Recent Labs Lab 11/09/16 2046 11/10/16 0558 11/10/16 1045 11/11/16 0729  TROPONINI 0.06* 0.06* 0.06* 0.06*    Recent Labs Lab 11/09/16 0900  TROPIPOC 0.01     BNP Recent Labs Lab 11/09/16 0859  BNP 93.3     DDimer No results for input(s): DDIMER in the last 168 hours.   Radiology    Dg Chest Port 1 View  Result Date: 11/10/2016 CLINICAL DATA:  Short of breath EXAM: PORTABLE CHEST 1 VIEW COMPARISON:  11/09/2016 FINDINGS: Pulmonary vascular congestion unchanged.  Negative for edema. Bibasilar airspace disease similar to the prior study, left greater than right negative for effusion IMPRESSION: Pulmonary vascular congestion unchanged Bibasilar airspace disease left greater than right unchanged Electronically Signed   By: Marlan Palau M.D.   On: 11/10/2016 12:33    Cardiac Studies     Patient Profile     71 y.o. male with a history of DM, HTN, chronic venous insuff, CKD, morbid obesity but no history of cardiac disease, who presented to [facility] ER with complaints of DOE. This has been occurring over several weeks. He was seen by his nephrologist on 6/12 who recommended  admission to ER. ER workup at that time included normal CXRAY and EKG and he was sent home. His symptoms worsened and he returned to the ER with hypoxia with O2 sats in the mid 80's and Cxray c/w acute CHF. He denies any chest pain or pressure but feels he cannot take a deep breath in. While in the ER he apparently went into SVT but spontaneously converted to NSR. His BNP is normal but felt to be due to underlying obesity. VQ scan was low prob for PE and no evidence of DVT on LE venous dopplers. He was started on IV lasix with good diuresis. Of note, he has CKD stage 4 with on ly 1 kidney s/p nephrotomy due to staghorn calculus. His baseline creatinine is around 2.2. Troponin was found to be mildly elevated at 0.06 x 3 and flat.  Assessment & Plan    1. Acute diastolic HF - echo LVEF 55-60%, cannot eval diastolic dusfunction, mod LAE suggests dysfunction - possibly exacerbated by episodes of PSVT - negative 3.3 liters yesterday though documentation incomplete, negative 2.6 liters since admission. Downtrend in Cr with diuresis consistent with venous congestion and CHF. He is on lasix 40mg  IV bid, we will continue. Volume status difficult to assess by exam given body habitus.   2. PSVT - he is on lopressor 25mg  tid. Still with PSVT episodes, we will increase to 50mg  bid.   3. Elevated troponin - mild flat elevation in setting of diastolic HF and PSVT, pattern not consitent with ACS. Echo with normal LVEF - given unlikely ACS and CKD, would not pursue ischemic testing at this time.    Joanie Coddington, MD  11/12/2016, 8:47 AM

## 2016-11-13 ENCOUNTER — Inpatient Hospital Stay (HOSPITAL_COMMUNITY): Payer: Medicare Other

## 2016-11-13 LAB — GLUCOSE, CAPILLARY
GLUCOSE-CAPILLARY: 149 mg/dL — AB (ref 65–99)
GLUCOSE-CAPILLARY: 132 mg/dL — AB (ref 65–99)
GLUCOSE-CAPILLARY: 121 mg/dL — AB (ref 65–99)
Glucose-Capillary: 171 mg/dL — ABNORMAL HIGH (ref 65–99)
Glucose-Capillary: 154 mg/dL — ABNORMAL HIGH (ref 65–99)

## 2016-11-13 MED ORDER — AMMONIUM LACTATE 12 % EX LOTN
TOPICAL_LOTION | Freq: Two times a day (BID) | CUTANEOUS | Status: DC
  Administered 2016-11-13 – 2016-11-15 (×5): via TOPICAL
  Filled 2016-11-13: qty 222

## 2016-11-13 MED ORDER — IPRATROPIUM-ALBUTEROL 0.5-2.5 (3) MG/3ML IN SOLN
3.0000 mL | Freq: Once | RESPIRATORY_TRACT | Status: AC
  Administered 2016-11-13: 3 mL via RESPIRATORY_TRACT
  Filled 2016-11-13: qty 3

## 2016-11-13 MED ORDER — IPRATROPIUM-ALBUTEROL 0.5-2.5 (3) MG/3ML IN SOLN
3.0000 mL | Freq: Four times a day (QID) | RESPIRATORY_TRACT | Status: DC | PRN
  Administered 2016-11-14: 3 mL via RESPIRATORY_TRACT
  Filled 2016-11-13: qty 3

## 2016-11-13 NOTE — Progress Notes (Signed)
RT attempted obtain labs; attempted x2 and was unsuccessful. Lab at bedside to assist. RN aware.

## 2016-11-13 NOTE — Progress Notes (Signed)
PROGRESS NOTE [facility]   Fernando GeninRichard A Bitton   ZOX:[id number] DOB: 1945/07/02  DOA: 11/09/2016 [facility]CP: [facility]   Brief Narrative:  Fernando GeninRichard A [name] is a 71 year old male with past medical history of diabetes, hypertension, chronic venous insufficiency, chronic kidney disease, morbidly obese and a long kidney presented to the emergency department complaining of shortness of breath. This started a month ago and has progressively worsened over the past few weeks prior to admission. He was seen by his nephrologist on 11/08/16 due to shortness of breath and recommended ED evaluation. He underwent EKG and chest x-ray with unremarkable results and patient was discharged home from EP. Symptoms persisted and he came to the emergency department where he was noted to be hypoxic in the mid 80s, he was placed in oxygen with some improvement. Chest x-ray showed increasing vascular congestion. Patient was admitted for possible CHF and further management. ECHO was done showed normal LVEF 55-60%, no able to eval diastolic dysfunction.  Subjective: Patient seen and examined, he reported his breathing is much better,   Assessment & Plan: Acute respiratory failure with hypoxia - 2/2 acute diastolic HF  See below   Acute diastolic HF  Initial presentation: CXR show pulmonary vascular congestion, on physical exam, breath sound decrease throughout, but no crackles or wheezing or rales noted. Normal BNP likely due to obesity. SVT likely triggered CHF episode.  Been treated with IV Lasix responding well, good UOP  Repeated CXR much better  Low prob for PE per VQ scan, no DVT on doppler Continue with IV Lasix 40 mg BID for now awaiting renal function today, if BMP improved can increase lasix.  Monitor I&O's, low salt diet, daily weights  Wean off O2 as tolerated  Check pulse ambulatory pulse ox  TED hose  Acute renal failure on CKD 4 - Improving  Only have 1 kidney, as he is s/p  nephrotomy due to staghorn calculus  Likely cardiorenal syndrome - venous congestion sCr improved  Patient lasix  Monitor BMP in AM   DM type 2  CBG's remains stable  Continue SSI  Continue to monitor  PSVT with mild elevated troponin TNI likely from demand ischemia - troponin has been flat, CKD also contributing  Cardiology consulted - patient started on Lopressor 25 mg TID adjusted to 50 mg BID No further ischemic workup indicated per cardiology   HTN - BP remains stable  Continue current regimen  Monitor BP   Morbid obesity - BMI > 41.4 Nutrition consult   Normocytic anemia  Hgb stable   DVT prophylaxis: Heparin subcutaneous Code Status: Full code Family Communication: Wife at bedside Disposition Plan: Home with HHPT when medically stable   Consultants:   None  Procedures:   Echo pending  Antimicrobials: Antibiotics Given (last 72 hours)    None      Objective: Vitals:   11/12/16 2121 11/13/16 0451 11/13/16 1023 11/13/16 1425  BP: (!) 141/73 121/79 130/77 140/79  Pulse: 83 63  63  Resp: 16 19  16   Temp: 98.2 F (36.8 C) 98.2 F (36.8 C)  98.5 F (36.9 C)  TempSrc: Oral Oral  Oral  SpO2: 98% 92%  94%  Weight:  (!) 137.9 kg (304 lb 0.2 oz)    Height:        Intake/Output Summary (Last 24 hours) at 11/13/16 1446 Last data filed at 11/13/16 1400  Gross per 24 hour  Intake  360 ml  Output             1975 ml  Net            -1615 ml   Filed Weights   11/11/16 0500 11/12/16 0500 11/13/16 0451  Weight: (!) 138.1 kg (304 lb 7.3 oz) 135.7 kg (299 lb 2.6 oz) (!) 137.9 kg (304 lb 0.2 oz)    Examination: No significant changes on physical exam from 11/12/2016  General: NAD Cardiovascular: RRR no murmurs  Respiratory: Air entry continues to improve, no crackles or rales  Abdominal: Soft, NT, ND, bowel sounds + Extremities: LE trace edema, chronic venous stasis changes, non tender to palpation   Data Reviewed: I have personally reviewed  following labs and imaging studies  CBC:  Recent Labs Lab 11/09/16 0859 11/10/16 0558 11/11/16 0729 11/12/16 0436  WBC 6.5 7.2 7.5 7.3  NEUTROABS 4.7  --   --  4.9  HGB 11.0* 11.0* 10.8* 11.3*  HCT 36.3* 37.7* 36.6* 38.2*  MCV 86.0 88.9 88.6 88.4  PLT 208 206 189 217   Basic Metabolic Panel:  Recent Labs Lab 11/09/16 0859 11/10/16 0558 11/11/16 0729 11/12/16 0436  NA 140 148* 141 141  K 4.4 5.1 5.0 4.7  CL 103 101 103 98*  CO2 31 35* 30 33*  GLUCOSE 130* 122* 126* 136*  BUN 36* 32* 31* 31*  CREATININE 2.39* 2.34* 2.23* 2.06*  CALCIUM 8.9 9.8 8.7* 9.0   GFR: Estimated Creatinine Clearance: 47.3 mL/min (A) (by C-G formula based on SCr of 2.06 mg/dL (H)). Liver Function Tests:  Recent Labs Lab 11/09/16 0859 11/10/16 0558  AST 13* 14*  ALT 10* 11*  ALKPHOS 46 44  BILITOT 0.7 0.3  PROT 6.8 6.7  ALBUMIN 3.4* 3.3*   Cardiac Enzymes:  Recent Labs Lab 11/09/16 2046 11/10/16 0558 11/10/16 1045 11/11/16 0729  TROPONINI 0.06* 0.06* 0.06* 0.06*   CBG:  Recent Labs Lab 11/12/16 1206 11/12/16 1725 11/13/16 0121 11/13/16 0801 11/13/16 1116  GLUCAP 123* 134* 171* 154* 149*   Anemia Panel: No results for input(s): VITAMINB12, FOLATE, FERRITIN, TIBC, IRON, RETICCTPCT in the last 72 hours.  Radiology Studies: No results found.  Scheduled Meds: . allopurinol  100 mg Oral Q breakfast  . ammonium lactate   Topical BID  . aspirin EC  81 mg Oral Daily  . donepezil  10 mg Oral Q breakfast  . finasteride  5 mg Oral Q breakfast  . furosemide  40 mg Intravenous BID  . heparin  5,000 Units Subcutaneous Q8H  . insulin aspart  0-15 Units Subcutaneous TID WC  . metoprolol tartrate  50 mg Oral BID  . protein supplement shake  11 oz Oral BID BM  . sodium chloride flush  3 mL Intravenous Q12H  . sodium chloride flush  3 mL Intravenous Q12H  . vitamin B-12  1,000 mcg Oral Q supper   Continuous Infusions: . sodium chloride       LOS: 4 days    Time spent:  Total of 15 minutes spent with pt, greater than 50% of which was spent in discussion of  treatment, counseling and coordination of care   Latrelle Dodrill, MD Pager: Text Page via [email or web address]  [phone]  If 7PM-7AM, please contact night-coverage [email or web address] Password [facility] 11/13/2016, 2:46 PM

## 2016-11-13 NOTE — Progress Notes (Signed)
Progress Note  Patient Name: Oleh Genin Date of Encounter: 11/13/2016   Subjective   Remains SOB  Inpatient Medications    Scheduled Meds: . allopurinol  100 mg Oral Q breakfast  . aspirin EC  81 mg Oral Daily  . donepezil  10 mg Oral Q breakfast  . finasteride  5 mg Oral Q breakfast  . furosemide  40 mg Intravenous BID  . heparin  5,000 Units Subcutaneous Q8H  . insulin aspart  0-15 Units Subcutaneous TID WC  . metoprolol tartrate  50 mg Oral BID  . protein supplement shake  11 oz Oral BID BM  . sodium chloride flush  3 mL Intravenous Q12H  . sodium chloride flush  3 mL Intravenous Q12H  . vitamin B-12  1,000 mcg Oral Q supper   Continuous Infusions: . sodium chloride     PRN Meds: sodium chloride, acetaminophen **OR** acetaminophen, ondansetron **OR** ondansetron (ZOFRAN) IV, sodium chloride, sodium chloride flush, technetium TC 97M diethylenetriame-pentaacetic acid   Vital Signs    Vitals:   11/12/16 1515 11/12/16 1554 11/12/16 2121 11/13/16 0451  BP:  (!) 142/64 (!) 141/73 121/79  Pulse:  65 83 63  Resp:  18 16 19   Temp:  98.6 F (37 C) 98.2 F (36.8 C) 98.2 F (36.8 C)  TempSrc:  Oral Oral Oral  SpO2: (!) 86% 98% 98% 92%  Weight:    (!) 304 lb 0.2 oz (137.9 kg)  Height:        Intake/Output Summary (Last 24 hours) at 11/13/16 0836 Last data filed at 11/13/16 0453  Gross per 24 hour  Intake              480 ml  Output              875 ml  Net             -395 ml   Filed Weights   11/11/16 0500 11/12/16 0500 11/13/16 0451  Weight: (!) 304 lb 7.3 oz (138.1 kg) 299 lb 2.6 oz (135.7 kg) (!) 304 lb 0.2 oz (137.9 kg)    Telemetry    SR, PACs - Personally Reviewed  ECG      Physical Exam   GEN: No acute distress.   Neck: No JVD Cardiac: RRR, no murmurs, rubs, or gallops.  Respiratory: bilateral expiratory wheezing GI: Soft, nontender, non-distended  MS: No edema; No deformity. Neuro:  Nonfocal  Psych: Normal affect   Labs      Chemistry Recent Labs Lab 11/09/16 0859 11/10/16 0558 11/11/16 0729 11/12/16 0436  NA 140 148* 141 141  K 4.4 5.1 5.0 4.7  CL 103 101 103 98*  CO2 31 35* 30 33*  GLUCOSE 130* 122* 126* 136*  BUN 36* 32* 31* 31*  CREATININE 2.39* 2.34* 2.23* 2.06*  CALCIUM 8.9 9.8 8.7* 9.0  PROT 6.8 6.7  --   --   ALBUMIN 3.4* 3.3*  --   --   AST 13* 14*  --   --   ALT 10* 11*  --   --   ALKPHOS 46 44  --   --   BILITOT 0.7 0.3  --   --   GFRNONAA 26* 26* 28* 31*  GFRAA 30* 31* 32* 36*  ANIONGAP 6 12 8 10      Hematology Recent Labs Lab 11/10/16 0558 11/11/16 0729 11/12/16 0436  WBC 7.2 7.5 7.3  RBC 4.24  4.24 4.13* 4.32  HGB 11.0* 10.8* 11.3*  HCT 37.7* 36.6* 38.2*  MCV 88.9 88.6 88.4  MCH 25.9* 26.2 26.2  MCHC 29.2* 29.5* 29.6*  RDW 15.2 14.8 14.5  PLT 206 189 217    Cardiac Enzymes Recent Labs Lab 11/09/16 2046 11/10/16 0558 11/10/16 1045 11/11/16 0729  TROPONINI 0.06* 0.06* 0.06* 0.06*    Recent Labs Lab 11/09/16 0900  TROPIPOC 0.01     BNP Recent Labs Lab 11/09/16 0859  BNP 93.3     DDimer No results for input(s): DDIMER in the last 168 hours.   Radiology    No results found.  Cardiac Studies     Patient Profile    71 y.o.malewith a history of DM, HTN, chronic venous insuff, CKD, morbid obesity but no history of cardiac disease, who [facility] with complaints of DOE. This has been occurring over several weeks. He was seen by his nephrologist on 6/12 who recommended admission to ER. ER workup at that time included normal CXRAY and EKG and he was sent home. His symptoms worsened and he returned to the ER with hypoxia with O2 sats in the mid 80's and Cxray c/w acute CHF. He denies any chest pain or pressure but feels he cannot take a deep breath in. While in the ER he apparently went into SVT but spontaneously converted to NSR. His BNP is normal but felt to be due to underlying obesity. VQ scan was low prob for PE and no evidence of DVT  on LE venous dopplers. He was started on IV lasix with good diuresis. Of note, he has CKD stage 4 with on ly 1 kidney s/p nephrotomy due to staghorn calculus. His baseline creatinine is around 2.2. Troponin was found to be mildly elevated at 0.06 x 3 and flat.   Assessment & Plan   1. Acute diastolic HF - echo LVEF 55-60%, cannot eval diastolic dusfunction, mod LAE suggests dysfunction - possibly exacerbated by episodes of PSVT - I/Os are incomplete, nighshift did not document oral intake. UOP only 1 liter.  He is on lasix 40mg  IV bid, labs are pending today. Volume status difficult to assess by exam given body habitus.  - remains hypoxic with activities. If renal function stable would increase lasix to 60mg  IV bid today.  - expiratory wheezing on exam, I have written for nebs  2. PSVT - yesterday increased lopressor to 50mg  bid.   3. Elevated troponin - mild flat elevation in setting of diastolic HF and PSVT, pattern not consitent with ACS. Echo with normal LVEF - given unlikely ACS and CKD, would not pursue ischemic testing at this time.   Joanie CoddingtonSigned, Maeve Debord, MD  11/13/2016, 8:36 AM

## 2016-11-14 DIAGNOSIS — N179 Acute kidney failure, unspecified: Secondary | ICD-10-CM

## 2016-11-14 DIAGNOSIS — R0902 Hypoxemia: Secondary | ICD-10-CM

## 2016-11-14 LAB — BASIC METABOLIC PANEL
Anion gap: 7 (ref 5–15)
BUN: 44 mg/dL — ABNORMAL HIGH (ref 6–20)
CALCIUM: 9.3 mg/dL (ref 8.9–10.3)
CHLORIDE: 97 mmol/L — AB (ref 101–111)
CO2: 36 mmol/L — ABNORMAL HIGH (ref 22–32)
CREATININE: 2 mg/dL — AB (ref 0.61–1.24)
GFR, EST AFRICAN AMERICAN: 37 mL/min — AB (ref >60)
GFR, EST NON AFRICAN AMERICAN: 32 mL/min — AB (ref >60)
Glucose, Bld: 139 mg/dL — ABNORMAL HIGH (ref 65–99)
Potassium: 4.6 mmol/L (ref 3.5–5.1)
SODIUM: 140 mmol/L (ref 135–145)

## 2016-11-14 LAB — GLUCOSE, CAPILLARY
GLUCOSE-CAPILLARY: 125 mg/dL — AB (ref 65–99)
GLUCOSE-CAPILLARY: 155 mg/dL — AB (ref 65–99)
Glucose-Capillary: 142 mg/dL — ABNORMAL HIGH (ref 65–99)
Glucose-Capillary: 183 mg/dL — ABNORMAL HIGH (ref 65–99)

## 2016-11-14 MED ORDER — FUROSEMIDE 10 MG/ML IJ SOLN
60.0000 mg | Freq: Two times a day (BID) | INTRAMUSCULAR | Status: DC
  Administered 2016-11-14 – 2016-11-15 (×2): 60 mg via INTRAVENOUS
  Filled 2016-11-14 (×2): qty 6

## 2016-11-14 NOTE — Progress Notes (Signed)
Progress Note  Patient Name: Fernando [name] Date of Encounter: 11/14/2016  Primary Cardiologist: None  Subjective   70 y.o.malewith a history of DM, HTN, chronic venous insuff, CKD, morbid obesity but no history of cardiac disease, who presented to [facility] ER with complaints of DOE. Of note, he has CKD stage 4 with on ly 1 kidney s/p nephrotomy due to staghorn calculus. Being seen this admission for acute diastolic HF and PSVT.  Per wife he was restless to sleeping last night and required breathing treatments. Resting comfortably this AM.  Inpatient Medications    Scheduled Meds: . allopurinol  100 mg Oral Q breakfast  . ammonium lactate   Topical BID  . aspirin EC  81 mg Oral Daily  . donepezil  10 mg Oral Q breakfast  . finasteride  5 mg Oral Q breakfast  . furosemide  40 mg Intravenous BID  . heparin  5,000 Units Subcutaneous Q8H  . insulin aspart  0-15 Units Subcutaneous TID WC  . metoprolol tartrate  50 mg Oral BID  . protein supplement shake  11 oz Oral BID BM  . sodium chloride flush  3 mL Intravenous Q12H  . sodium chloride flush  3 mL Intravenous Q12H  . vitamin B-12  1,000 mcg Oral Q supper   Continuous Infusions: . sodium chloride     PRN Meds: sodium chloride, acetaminophen **OR** acetaminophen, ipratropium-albuterol, ondansetron **OR** ondansetron (ZOFRAN) IV, sodium chloride, sodium chloride flush, technetium TC 4M diethylenetriame-pentaacetic acid   Vital Signs    Vitals:   11/13/16 2208 11/14/16 0124 11/14/16 0144 11/14/16 0519  BP: (!) 124/54 132/84  (!) 112/57  Pulse: 66 (!) 128  67  Resp: 16 20  18   Temp: 98 F (36.7 C)   98.3 F (36.8 C)  TempSrc: Oral   Oral  SpO2: 95% 95% 93% 94%  Weight:    299 lb 2.6 oz (135.7 kg)  Height:        Intake/Output Summary (Last 24 hours) at 11/14/16 1030 Last data filed at 11/14/16 0900  Gross per 24 hour  Intake              360 ml  Output             1575 ml  Net            -1215 ml   Filed Weights    11/12/16 0500 11/13/16 0451 11/14/16 0519  Weight: 299 lb 2.6 oz (135.7 kg) (!) 304 lb 0.2 oz (137.9 kg) 299 lb 2.6 oz (135.7 kg)    Telemetry    Multiple PVC's. Rhythm is irregular. Regular rate. - Personally Reviewed   Physical Exam   GEN: Well nourished, well developed HEENT: normal  Neck: no JVD, carotid bruits, or masses Cardiac: irregular rate no murmurs, rubs, or gallops,no edema. Intact distal pulses bilaterally.  Respiratory: clear to auscultation bilaterally, normal work of breathing GI: soft, nontender, nondistended, + BS MS: no deformity or atrophy  Skin: warm and dry, no rash Neuro: Alert and Oriented x 3, Strength and sensation are intact Psych:   Full affect  Labs    Chemistry Recent Labs Lab 11/09/16 0859 11/10/16 0558 11/11/16 0729 11/12/16 0436 11/14/16 0510  NA 140 148* 141 141 140  K 4.4 5.1 5.0 4.7 4.6  CL 103 101 103 98* 97*  CO2 31 35* 30 33* 36*  GLUCOSE 130* 122* 126* 136* 139*  BUN 36* 32* 31* 31* 44*  CREATININE 2.39* 2.34*  2.23* 2.06* 2.00*  CALCIUM 8.9 9.8 8.7* 9.0 9.3  PROT 6.8 6.7  --   --   --   ALBUMIN 3.4* 3.3*  --   --   --   AST 13* 14*  --   --   --   ALT 10* 11*  --   --   --   ALKPHOS 46 44  --   --   --   BILITOT 0.7 0.3  --   --   --   GFRNONAA 26* 26* 28* 31* 32*  GFRAA 30* 31* 32* 36* 37*  ANIONGAP 6 12 8 10 7      Hematology Recent Labs Lab 11/10/16 0558 11/11/16 0729 11/12/16 0436  WBC 7.2 7.5 7.3  RBC 4.24  4.24 4.13* 4.32  HGB 11.0* 10.8* 11.3*  HCT 37.7* 36.6* 38.2*  MCV 88.9 88.6 88.4  MCH 25.9* 26.2 26.2  MCHC 29.2* 29.5* 29.6*  RDW 15.2 14.8 14.5  PLT 206 189 217    Cardiac Enzymes Recent Labs Lab 11/09/16 2046 11/10/16 0558 11/10/16 1045 11/11/16 0729  TROPONINI 0.06* 0.06* 0.06* 0.06*    Recent Labs Lab 11/09/16 0900  TROPIPOC 0.01     BNP Recent Labs Lab 11/09/16 0859  BNP 93.3     DDimer No results for input(s): DDIMER in the last 168 hours.   Radiology    Dg  Chest Port 1 View  Result Date: 11/13/2016 CLINICAL DATA:  CHF EXAM: PORTABLE CHEST 1 VIEW COMPARISON:  11/10/2016 FINDINGS: Bibasilar airspace disease left greater than right unchanged. Mild vascular congestion without edema or effusion. Cardiac enlargement. IMPRESSION: Cardiac enlargement with vascular congestion unchanged Bibasilar atelectasis/ infiltrate unchanged Electronically Signed   By: Marlan Palau M.D.   On: 11/13/2016 15:10    Cardiac Studies   TTE 11/11/2016  Study Conclusions  - Left ventricle: The cavity size was normal. Wall thickness was   increased in a pattern of moderate LVH. Systolic function was   normal. The estimated ejection fraction was in the range of 55%   to 60%. The study is not technically sufficient to allow   evaluation of LV diastolic function. - Mitral valve: Calcified annulus. Mildly thickened leaflets . - Left atrium: The atrium was moderately dilated. - Right ventricle: Not well seen on subcostal appears mildly   dilated on apical views.   Patient Profile   71 y.o.malewith a history of DM, HTN, chronic venous insuff, CKD, morbid obesity but no history of cardiac disease, who presented to [facility] ER with complaints of DOE. This has been occurring over several weeks. He was seen by his nephrologist on 6/12 who recommended admission to ER. ER workup at that time included normal CXRAY and EKG and he was sent home. His symptoms worsened and he returned to the ER with hypoxia with O2 sats in the mid 80's and Cxray c/w acute CHF. He denies any chest pain or pressure but feels he cannot take a deep breath in. While in the ER he apparently went into SVT but spontaneously converted to NSR. His BNP is normal but felt to be due to underlying obesity. VQ scan was low prob for PE and no evidence of DVT on LE venous dopplers. He was started on IV lasix with good diuresis. Of note, he has CKD stage 4 with on ly 1 kidney s/p nephrotomy due to staghorn  calculus. His baseline creatinine is around 2.2. Troponin was found to be mildly elevated at 0.06 x 3 and flat.  Assessment & Plan     1. Acute diastolic HF:  Most recent echo LVEF 55-60%, cannot eval for diastolic dys. Left atrial enlargement suggests diastolic dysfunction.  -- I/O's report total output at 1.5 liters yesterday with net loss of 1.2.  -- BUN 44, up from 31 on  6/16 and creatinine is 2.00 from 2.06 on 6/16. This appears to be his baseline per chart review. Last was held yesterday because they were unable to draw blood due to body habitus. He is still having SOB and unable to lay flat.   Recommend increasing lasix to 60 mg IV BID today. Breathing treatments PRN for wheezing and SOB as well. He still clinically appears fluid overloaded. Weight is down 5 lbs from admission.  2. PSVT: Lopressor increased to 50 mg BID on 11/12/2016 appears to be tolerating without recurrent SVT, rates in the 50s. Did have an episode of tachycardia, rates 120-130s last night.  3. Elevated Troponin: Flat troponins likely related to demand ischemia and not ACS, ischemic testing deferred at this time. Normal LVEF on echo.  Signed, Dorthula MatasGREENE,TIFFANY G, PA-C  11/14/2016, 10:30 AM    Personally seen and examined. Agree with above. See my note as well.  Donato SchultzMark Milanie Rosenfield, MD

## 2016-11-14 NOTE — Progress Notes (Signed)
PROGRESS NOTE [facility]   Fernando GeninRichard A [name]   ZOX:[id number] DOB: September 22, 1945  DOA: 11/09/2016 PCP: [facility]   Brief Narrative:  Fernando GeninRichard A [name] is a 71 year old male with past medical history of diabetes, hypertension, chronic venous insufficiency, chronic kidney disease, morbidly obese and a long kidney presented to the emergency department complaining of shortness of breath. This started a month ago and has progressively worsened over the past few weeks prior to admission. He was seen by his nephrologist on 11/08/16 due to shortness of breath and recommended ED evaluation. He underwent EKG and chest x-ray with unremarkable results and patient was discharged home from EP. Symptoms persisted and he came to the emergency department where he was noted to be hypoxic in the mid 80s, he was placed in oxygen with some improvement. Chest x-ray showed increasing vascular congestion. Patient was admitted for possible CHF and further management. ECHO was done showed normal LVEF 55-60%, no able to eval diastolic dysfunction.  Subjective: Patient seen and examined, feeling well, although very SOB overnight. Good UOP. Ambulate w/o O2 supplement and remained > 91%   Assessment & Plan: Acute respiratory failure with hypoxia - 2/2 acute diastolic HF - Improved  See below   Acute diastolic HF  Initial presentation: CXR show pulmonary vascular congestion, on physical exam, breath sound decrease throughout, but no crackles or wheezing or rales noted. Normal BNP likely due to obesity. SVT likely triggered CHF episode.  Been treated with IV Lasix responding well, good UOP  Repeated CXR much better  Low prob for PE per VQ scan, no DVT on doppler Monitor I&O's, low salt diet, daily weights  Wean off O2 as tolerated  Check pulse ambulatory pulse ox  TED hose Cardiology increase Lasix to 60 mg IV today I suspect some component of OSA - will order a trial of CPAP qHS  Acute renal  failure on CKD 4 - Improving  Only have 1 kidney, as he is s/p nephrotomy due to staghorn calculus  Likely cardiorenal syndrome - venous congestion Cr continues to improved despite lasix - Cr is at baseline now  Check BMP in 1 week   DM type 2  CBG's remains stable  Continue SSI  Continue to monitor  PSVT with mild elevated troponin TNI likely from demand ischemia - troponin has been flat, CKD also contributing  Cardiology consulted - patient started on Lopressor 25 mg TID adjusted to 50 mg BID No further ischemic workup indicated per cardiology   HTN - BP remains stable  Continue current regimen  Monitor BP   Morbid obesity - BMI > 41.4 Nutrition consult   Normocytic anemia  Hgb stable   DVT prophylaxis: Heparin subcutaneous Code Status: Full code Family Communication: Wife at bedside Disposition Plan: Home with HHPT hopefully next 24 -48 hrs   Consultants:   None  Procedures:   Echo pending  Antimicrobials: Antibiotics Given (last 72 hours)    None      Objective: Vitals:   11/14/16 0124 11/14/16 0144 11/14/16 0519 11/14/16 1206  BP: 132/84  (!) 112/57 118/72  Pulse: (!) 128  67 75  Resp: 20  18   Temp:   98.3 F (36.8 C)   TempSrc:   Oral   SpO2: 95% 93% 94%   Weight:   135.7 kg (299 lb 2.6 oz)   Height:        Intake/Output Summary (Last 24 hours) at 11/14/16 1403 Last data filed at 11/14/16 1244  Gross per 24 hour  Intake              600 ml  Output              575 ml  Net               25 ml   Filed Weights   11/12/16 0500 11/13/16 0451 11/14/16 0519  Weight: 135.7 kg (299 lb 2.6 oz) (!) 137.9 kg (304 lb 0.2 oz) 135.7 kg (299 lb 2.6 oz)    Examination:   General: NAD  Cardiovascular: RRR, S1/S2, no rubs, no gallops Respiratory: Good air entry, still decrease at the bases  Abdominal: Obese softt, NT, ND, bowel sounds + Extremities: b/l LE edema, chronic venous changes,  no cyanosis   Data Reviewed: I have personally reviewed  following labs and imaging studies  CBC:  Recent Labs Lab 11/09/16 0859 11/10/16 0558 11/11/16 0729 11/12/16 0436  WBC 6.5 7.2 7.5 7.3  NEUTROABS 4.7  --   --  4.9  HGB 11.0* 11.0* 10.8* 11.3*  HCT 36.3* 37.7* 36.6* 38.2*  MCV 86.0 88.9 88.6 88.4  PLT 208 206 189 217   Basic Metabolic Panel:  Recent Labs Lab 11/09/16 0859 11/10/16 0558 11/11/16 0729 11/12/16 0436 11/14/16 0510  NA 140 148* 141 141 140  K 4.4 5.1 5.0 4.7 4.6  CL 103 101 103 98* 97*  CO2 31 35* 30 33* 36*  GLUCOSE 130* 122* 126* 136* 139*  BUN 36* 32* 31* 31* 44*  CREATININE 2.39* 2.34* 2.23* 2.06* 2.00*  CALCIUM 8.9 9.8 8.7* 9.0 9.3   GFR: Estimated Creatinine Clearance: 48.3 mL/min (A) (by C-G formula based on SCr of 2 mg/dL (H)). Liver Function Tests:  Recent Labs Lab 11/09/16 0859 11/10/16 0558  AST 13* 14*  ALT 10* 11*  ALKPHOS 46 44  BILITOT 0.7 0.3  PROT 6.8 6.7  ALBUMIN 3.4* 3.3*   Cardiac Enzymes:  Recent Labs Lab 11/09/16 2046 11/10/16 0558 11/10/16 1045 11/11/16 0729  TROPONINI 0.06* 0.06* 0.06* 0.06*   CBG:  Recent Labs Lab 11/13/16 1116 11/13/16 1734 11/13/16 2157 11/14/16 0753 11/14/16 1202  GLUCAP 149* 132* 121* 125* 155*   Anemia Panel: No results for input(s): VITAMINB12, FOLATE, FERRITIN, TIBC, IRON, RETICCTPCT in the last 72 hours.  Radiology Studies: Dg Chest Port 1 View  Result Date: 11/13/2016 CLINICAL DATA:  CHF EXAM: PORTABLE CHEST 1 VIEW COMPARISON:  11/10/2016 FINDINGS: Bibasilar airspace disease left greater than right unchanged. Mild vascular congestion without edema or effusion. Cardiac enlargement. IMPRESSION: Cardiac enlargement with vascular congestion unchanged Bibasilar atelectasis/ infiltrate unchanged Electronically Signed   By: Marlan Palau M.D.   On: 11/13/2016 15:10    Scheduled Meds: . allopurinol  100 mg Oral Q breakfast  . ammonium lactate   Topical BID  . aspirin EC  81 mg Oral Daily  . donepezil  10 mg Oral Q breakfast  .  finasteride  5 mg Oral Q breakfast  . furosemide  60 mg Intravenous BID  . heparin  5,000 Units Subcutaneous Q8H  . insulin aspart  0-15 Units Subcutaneous TID WC  . metoprolol tartrate  50 mg Oral BID  . protein supplement shake  11 oz Oral BID BM  . sodium chloride flush  3 mL Intravenous Q12H  . sodium chloride flush  3 mL Intravenous Q12H  . vitamin B-12  1,000 mcg Oral Q supper   Continuous Infusions: . sodium chloride  LOS: 5 days    Time spent: Total of 15 minutes spent with pt, greater than 50% of which was spent in discussion of  treatment, counseling and coordination of care   Latrelle Dodrill, MD Pager: Text Page via [email or web address]  [phone]  If 7PM-7AM, please contact night-coverage [email or web address] Password [email or web address] 11/14/2016, 2:03 PM

## 2016-11-14 NOTE — Progress Notes (Signed)
Physical Therapy Treatment Patient Details Name: Fernando [name] MRN: [id number] DOB: 02-08-1946 Today's Date: 11/14/2016    History of Present Illness Fernando [name] is a 71 y.o. male with a past medical history of diabetes on oral agents, hypertension, lower extremity edema, lone kidney, chronic kidney disease unknown stage, morbid obesity, who was in his usual state of health till about 2-3 weeks ago when he started getting short of breath    PT Comments    Pt making excellent progress today, incr gait distance tolerance--able to amb ~320' with RW, O2 sats >91% on RA   Follow Up Recommendations  Home health PT;Supervision for mobility/OOB     Equipment Recommendations  Rolling walker with 5" wheels    Recommendations for Other Services       Precautions / Restrictions Precautions Precautions: Fall Precaution Comments: monitor 02 Restrictions Weight Bearing Restrictions: No    Mobility  Bed Mobility Overal bed mobility: Needs Assistance Bed Mobility: Supine to Sit     Supine to sit: Supervision;HOB elevated     General bed mobility comments: incr time, HOB elevated  Transfers Overall transfer level: Needs assistance Equipment used: Rolling walker (2 wheeled) Transfers: Sit to/from Stand Sit to Stand: Supervision         General transfer comment: cues to control descent  Ambulation/Gait Ambulation/Gait assistance: Supervision;Min guard Ambulation Distance (Feet): 320 Feet Assistive device: Rolling walker (2 wheeled) Gait Pattern/deviations: Step-through pattern;Wide base of support;Decreased stride length;Trunk flexed Gait velocity: decreased    General Gait Details: O2 sats 91-97% on RA during mobility; cues for trunk extension   Stairs            Wheelchair Mobility    Modified Rankin (Stroke Patients Only)       Balance                                            Cognition Arousal/Alertness:  Awake/alert Behavior During Therapy: WFL for tasks assessed/performed Overall Cognitive Status: Within Functional Limits for tasks assessed                                        Exercises      General Comments        Pertinent Vitals/Pain Pain Assessment: No/denies pain    Home Living                      Prior Function            PT Goals (current goals can now be found in the care plan section) Acute Rehab PT Goals Patient Stated Goal: none stated PT Goal Formulation: With patient Time For Goal Achievement: 11/17/16 Progress towards PT goals: Progressing toward goals    Frequency    Min 3X/week      PT Plan Current plan remains appropriate    Co-evaluation              AM-PAC PT "6 Clicks" Daily Activity  Outcome Measure  Difficulty turning over in bed (including adjusting bedclothes, sheets and blankets)?: Total Difficulty moving from lying on back to sitting on the side of the bed? : Total Difficulty sitting down on and standing up from a chair with arms (e.g., wheelchair, bedside commode, etc,.)?: A Little Help  needed moving to and from a bed to chair (including a wheelchair)?: A Little Help needed walking in hospital room?: A Little Help needed climbing 3-5 steps with a railing? : A Little 6 Click Score: 14    End of Session Equipment Utilized During Treatment: Gait belt Activity Tolerance: Patient tolerated treatment well Patient left: in chair;with call bell/phone within reach;with family/visitor present Nurse Communication: Mobility status PT Visit Diagnosis: Unsteadiness on feet (R26.81)     Time: [id number] PT Time Calculation (min) (ACUTE ONLY): 26 min  Charges:  $Gait Training: 23-37 mins                    G Codes:          Amairani Shuey 11/14/2016, 1:53 PM

## 2016-11-14 NOTE — Progress Notes (Signed)
Progress Note  Patient Name: Fernando [name] Date of Encounter: 11/14/2016  Primary Cardiologist: Mayford Knife new  Subjective   Discussed with wife. Last night, restless and called respiratory. By the time they got there, he was feeling better.   Inpatient Medications    Scheduled Meds: . allopurinol  100 mg Oral Q breakfast  . ammonium lactate   Topical BID  . aspirin EC  81 mg Oral Daily  . donepezil  10 mg Oral Q breakfast  . finasteride  5 mg Oral Q breakfast  . furosemide  40 mg Intravenous BID  . heparin  5,000 Units Subcutaneous Q8H  . insulin aspart  0-15 Units Subcutaneous TID WC  . metoprolol tartrate  50 mg Oral BID  . protein supplement shake  11 oz Oral BID BM  . sodium chloride flush  3 mL Intravenous Q12H  . sodium chloride flush  3 mL Intravenous Q12H  . vitamin B-12  1,000 mcg Oral Q supper   Continuous Infusions: . sodium chloride     PRN Meds: sodium chloride, acetaminophen **OR** acetaminophen, ipratropium-albuterol, ondansetron **OR** ondansetron (ZOFRAN) IV, sodium chloride, sodium chloride flush, technetium TC 37M diethylenetriame-pentaacetic acid   Vital Signs    Vitals:   11/13/16 2208 11/14/16 0124 11/14/16 0144 11/14/16 0519  BP: (!) 124/54 132/84  (!) 112/57  Pulse: 66 (!) 128  67  Resp: 16 20  18   Temp: 98 F (36.7 C)   98.3 F (36.8 C)  TempSrc: Oral   Oral  SpO2: 95% 95% 93% 94%  Weight:    299 lb 2.6 oz (135.7 kg)  Height:        Intake/Output Summary (Last 24 hours) at 11/14/16 1029 Last data filed at 11/14/16 0900  Gross per 24 hour  Intake              360 ml  Output             1575 ml  Net            -1215 ml   Filed Weights   11/12/16 0500 11/13/16 0451 11/14/16 0519  Weight: 299 lb 2.6 oz (135.7 kg) (!) 304 lb 0.2 oz (137.9 kg) 299 lb 2.6 oz (135.7 kg)    Telemetry    SVT previously with HR 130 - Personally Reviewed  ECG    NSR - Personally Reviewed  Physical Exam   GEN: No acute distress.  Somnolent Neck:  No JVD Cardiac: RRR, no murmurs, rubs, or gallops.  Respiratory: Clear to auscultation bilaterally. GI: Soft, nontender, non-distended OBESE MS: No edema; No deformity. Neuro:  Nonfocal  Psych: Normal affect Sleepy  Labs    Chemistry Recent Labs Lab 11/09/16 0859 11/10/16 0558 11/11/16 0729 11/12/16 0436 11/14/16 0510  NA 140 148* 141 141 140  K 4.4 5.1 5.0 4.7 4.6  CL 103 101 103 98* 97*  CO2 31 35* 30 33* 36*  GLUCOSE 130* 122* 126* 136* 139*  BUN 36* 32* 31* 31* 44*  CREATININE 2.39* 2.34* 2.23* 2.06* 2.00*  CALCIUM 8.9 9.8 8.7* 9.0 9.3  PROT 6.8 6.7  --   --   --   ALBUMIN 3.4* 3.3*  --   --   --   AST 13* 14*  --   --   --   ALT 10* 11*  --   --   --   ALKPHOS 46 44  --   --   --   BILITOT 0.7 0.3  --   --   --  GFRNONAA 26* 26* 28* 31* 32*  GFRAA 30* 31* 32* 36* 37*  ANIONGAP 6 12 8 10 7      Hematology Recent Labs Lab 11/10/16 0558 11/11/16 0729 11/12/16 0436  WBC 7.2 7.5 7.3  RBC 4.24  4.24 4.13* 4.32  HGB 11.0* 10.8* 11.3*  HCT 37.7* 36.6* 38.2*  MCV 88.9 88.6 88.4  MCH 25.9* 26.2 26.2  MCHC 29.2* 29.5* 29.6*  RDW 15.2 14.8 14.5  PLT 206 189 217    Cardiac Enzymes Recent Labs Lab 11/09/16 2046 11/10/16 0558 11/10/16 1045 11/11/16 0729  TROPONINI 0.06* 0.06* 0.06* 0.06*    Recent Labs Lab 11/09/16 0900  TROPIPOC 0.01     BNP Recent Labs Lab 11/09/16 0859  BNP 93.3     DDimer No results for input(s): DDIMER in the last 168 hours.   Radiology    Dg Chest Port 1 View  Result Date: 11/13/2016 CLINICAL DATA:  CHF EXAM: PORTABLE CHEST 1 VIEW COMPARISON:  11/10/2016 FINDINGS: Bibasilar airspace disease left greater than right unchanged. Mild vascular congestion without edema or effusion. Cardiac enlargement. IMPRESSION: Cardiac enlargement with vascular congestion unchanged Bibasilar atelectasis/ infiltrate unchanged Electronically Signed   By: Marlan Palau M.D.   On: 11/13/2016 15:10    Cardiac Studies   ECHO 11/11/16 - Left  ventricle: The cavity size was normal. Wall thickness was   increased in a pattern of moderate LVH. Systolic function was   normal. The estimated ejection fraction was in the range of 55%   to 60%. The study is not technically sufficient to allow   evaluation of LV diastolic function. - Mitral valve: Calcified annulus. Mildly thickened leaflets . - Left atrium: The atrium was moderately dilated. - Right ventricle: Not well seen on subcostal appears mildly   dilated on apical views.  Patient Profile     71 y.o. male with a history of DM, HTN, chronic venous insuff, CKD, morbid obesity but no history of cardiac disease, who presented to [facility] ER with complaints of DOE. This has been occurring over several weeks. He was seen by his nephrologist on 6/12 who recommended admission to ER. ER workup at that time included normal CXRAY and EKG and he was sent home. His symptoms worsened and he returned to the ER with hypoxia with O2 sats in the mid 80's and Cxray c/w acute CHF. He denies any chest pain or pressure but feels he cannot take a deep breath in. While in the ER he apparently went into SVT but spontaneously converted to NSR. His BNP is normal but felt to be due to underlying obesity. VQ scan was low prob for PE and no evidence of DVT on LE venous dopplers. He was started on IV lasix with good diuresis. Of note, he has CKD stage 4 with only 1 kidney s/p nephrotomy due to staghorn calculus. His baseline creatinine is around 2.2. Troponin was found to be mildly elevated at 0.06 x 3 and flat.  Assessment & Plan    Acute diastolic HF - echo LVEF 55-60%, cannot eval diastolic dusfunction, mod LAE suggests dysfunction -He is on lasix 40mg  IV bid, and BUN and creat are increasing. Volume status difficult to assess by exam given body habitus.  - remains hypoxic with activities - expiratory wheezing prior on exam, nebs  PSVT -  Previously increased lopressor to 50mg  bid.   Demand  ischemia  - mild flat troponin elevation in setting of diastolic HF and PSVT, pattern not consitent with ACS. Echo  with normal LVEF - given unlikely ACS and CKD, would not pursue ischemic testing at this time.   Obesity hypoventilation syndrome  - likely has a degree of this. Consider outpatient sleep evaluation.   - Optimally, 100 pound weight loss would be helpful.   Signed, Donato SchultzMark Dreden Rivere, MD  11/14/2016, 10:29 AM

## 2016-11-15 DIAGNOSIS — I248 Other forms of acute ischemic heart disease: Secondary | ICD-10-CM

## 2016-11-15 LAB — BASIC METABOLIC PANEL
ANION GAP: 10 (ref 5–15)
BUN: 49 mg/dL — ABNORMAL HIGH (ref 6–20)
CHLORIDE: 95 mmol/L — AB (ref 101–111)
CO2: 33 mmol/L — ABNORMAL HIGH (ref 22–32)
Calcium: 9.4 mg/dL (ref 8.9–10.3)
Creatinine, Ser: 1.8 mg/dL — ABNORMAL HIGH (ref 0.61–1.24)
GFR calc non Af Amer: 36 mL/min — ABNORMAL LOW (ref >60)
GFR, EST AFRICAN AMERICAN: 42 mL/min — AB (ref >60)
GLUCOSE: 125 mg/dL — AB (ref 65–99)
POTASSIUM: 5.2 mmol/L — AB (ref 3.5–5.1)
Sodium: 138 mmol/L (ref 135–145)

## 2016-11-15 LAB — GLUCOSE, CAPILLARY: Glucose-Capillary: 120 mg/dL — ABNORMAL HIGH (ref 65–99)

## 2016-11-15 MED ORDER — AMMONIUM LACTATE 12 % EX LOTN: TOPICAL_LOTION | Freq: Two times a day (BID) | CUTANEOUS | 0 refills | Status: DC

## 2016-11-15 MED ORDER — FUROSEMIDE 40 MG PO TABS: 60.0000 mg | ORAL_TABLET | Freq: Two times a day (BID) | ORAL | 0 refills | Status: DC

## 2016-11-15 MED ORDER — ASPIRIN EC 81 MG PO TBEC: 81.0000 mg | DELAYED_RELEASE_TABLET | Freq: Every day | ORAL | Status: DC

## 2016-11-15 MED ORDER — METOPROLOL TARTRATE 50 MG PO TABS: 50.0000 mg | ORAL_TABLET | Freq: Two times a day (BID) | ORAL | 0 refills | Status: DC

## 2016-11-15 MED ORDER — PREMIER PROTEIN SHAKE: 11.0000 [oz_av] | Freq: Two times a day (BID) | ORAL | 0 refills | Status: DC

## 2016-11-15 NOTE — Progress Notes (Signed)
Patient Discharged Home. Discharge instructions  provided to the patient which included instructions that addressed activity level, diet, discharge medications, follow-up appointments, weight monitoring and what to do if symptoms worsen. All patients Questions answered.   

## 2016-11-15 NOTE — Discharge Summary (Signed)
Physician Discharge Summary  Fernando GeninRichard A [name]  UJW:[id number]:[id number]  DOB: 1945/11/15  DOA: 11/09/2016 [facility]CP: [facility]  Admit date: 11/09/2016 Discharge date: 11/15/2016  Admitted From: Home  Disposition:  Home   Recommendations for Outpatient Follow-up:  1. Follow up with PCP in 1 weeks 2. Please obtain BMP/CBC in one week to monitor Hgb and renal function  3. Follow up with Cardiology in 1-2 weeks   Home Health: PT/OT, Aide  Equipment/Devices: Rolling walker    Discharge Condition: Stable  CODE STATUS: FULL Code Diet recommendation: Heart Healthy  Brief/Interim Summary: Fernando [name] is a 71 year old male with past medical history of diabetes, hypertension, chronic venous insufficiency, chronic kidney disease, morbidly obese and a long kidney presented to the emergency department complaining of shortness of breath. This started a month ago and has progressively worsened over the past few weeks prior to admission. He was seen by his nephrologist on 11/08/16 due to shortness of breath and recommended ED evaluation. He underwent EKG and chest x-ray with unremarkable results and patient was discharged home. Symptoms persisted and he came to the emergency department on 11/09/2016 where he was noted to be hypoxic in the mid 80s, he was placed in oxygen with some improvement. Chest x-ray showed increasing vascular congestion. Also found to have elevated AKI and was given IVF in the ED.  Patient was admitted for possible CHF and further management. ECHO was done showed normal LVEF 55-60%, no able to eval diastolic dysfunction. Patient was started on IV lasix, showing good diuresis and improvement of SOB. Patient lost 10 lb during hospital stay. He was able to wean of O2 supplementation and now he is breathing at room air with good oxygen saturation. Cr also improved with diuresis. Patient was evaluated by PT whom recommended HH PT. Patient will be discharge home with oral lasix and to  follow up with PCP and cardiology.   Subjective: Patient seen and examined on the day of discharge. Patient sitting up in chair, off O2 supplement. Asking when is he going home. Patient report he his breathing is back to normal and hi leg edema is significantly improved. Trail of CPAP last night patient took off mask. Denies SOB, chest pain, palpitations, dizziness and weakness.   Discharge Diagnoses/Hospital Course:  Acute respiratory failure with hypoxia - 2/2 acute diastolic HF - Improved  See below   Acute diastolic HF  Initial presentation: CXR show pulmonary vascular congestion, on physical exam, breath sound decrease throughout, but no crackles, wheezing or rales noted. Normal BNP likely due to obesity. SVT likely triggered CHF episode.  Patient was treated with IV lasix - subsequently transitioned to oral Lasix will d/c on 60 mg daily  Repeated CXR much better  Low prob for PE per VQ scan, no DVT on doppler Continue compression stockings  I suspect some component of OSA/OHS - ordered sleep studies as outpatient   Acute renal failure on CKD 4 - continues to improve  Only have 1 kidney, as he is s/p nephrotomy due to staghorn calculus  Likely cardiorenal syndrome - venous congestion Cr on admission 2.34, upon discharge 1.80 Check BMP in 1 week   Chronic venous insufficiency  Compression stocking Patient was on aldactone prior admission will hold for now  Leg elevation encourage   DM type 2  CBG's remains stable  Continue SSI  Continue to monitor  PSVT with mild elevated troponin TNI likely from demand ischemia - troponin has been flat, CKD also contributing  Cardiology consulted - patient started on Lopressor 25 mg TID adjusted to 50 mg BID No further ischemic workup indicated per cardiology   HTN - BP remains stable  Continue current regimen  Monitor BP   Morbid obesity - BMI > 41.4 Weight loss strongly encourage    Normocytic anemia  Hgb stable   All  other chronic medical condition were stable during the hospitalization.  Patient was seen by physical therapy, recommending HH PT  On the day of the discharge the patient's vitals were stable, and no other acute medical condition were reported by patient. Patient was felt safe to be discharge to home.   Discharge Instructions  You were cared for by a hospitalist during your hospital stay. If you have any questions about your discharge medications or the care you received while you were in the hospital after you are discharged, you can call the unit and asked to speak with the hospitalist on call if the hospitalist that took care of you is not available. Once you are discharged, your primary care physician will handle any further medical issues. Please note that NO REFILLS for any discharge medications will be authorized once you are discharged, as it is imperative that you return to your primary care physician (or establish a relationship with a primary care physician if you do not have one) for your aftercare needs so that they can reassess your need for medications and monitor your lab values.  Discharge Instructions    (HEART FAILURE PATIENTS) Call MD:  Anytime you have any of the following symptoms: 1) 3 pound weight gain in 24 hours or 5 pounds in 1 week 2) shortness of breath, with or without a dry hacking cough 3) swelling in the hands, feet or stomach 4) if you have to sleep on extra pillows at night in order to breathe.    Complete by:  As directed    Call MD for:  difficulty breathing, headache or visual disturbances    Complete by:  As directed    Call MD for:  extreme fatigue    Complete by:  As directed    Call MD for:  hives    Complete by:  As directed    Call MD for:  persistant dizziness or light-headedness    Complete by:  As directed    Call MD for:  persistant nausea and vomiting    Complete by:  As directed    Call MD for:  redness, tenderness, or signs of infection (pain,  swelling, redness, odor or green/yellow discharge around incision site)    Complete by:  As directed    Call MD for:  severe uncontrolled pain    Complete by:  As directed    Call MD for:  temperature >100.4    Complete by:  As directed    Diet - low sodium heart healthy    Complete by:  As directed    Increase activity slowly    Complete by:  As directed      Allergies as of 11/15/2016   No Known Allergies     Medication List    STOP taking these medications   carvedilol 6.25 MG tablet Commonly known as:  COREG   simvastatin 80 MG tablet Commonly known as:  ZOCOR   spironolactone 25 MG tablet Commonly known as:  ALDACTONE   torsemide 20 MG tablet Commonly known as:  DEMADEX     TAKE these medications   allopurinol 100 MG tablet  Commonly known as:  ZYLOPRIM Take 100 mg by mouth daily with breakfast.   ammonium lactate 12 % lotion Commonly known as:  LAC-HYDRIN Apply topically 2 (two) times daily.   aspirin EC 81 MG tablet Take 1 tablet (81 mg total) by mouth daily. What changed:  when to take this  additional instructions   cholecalciferol 1000 units tablet Commonly known as:  VITAMIN D Take 1,000 Units by mouth daily with breakfast.   docusate sodium 50 MG capsule Commonly known as:  COLACE Take 50 mg by mouth 2 (two) times daily.   donepezil 10 MG tablet Commonly known as:  ARICEPT Take 10 mg by mouth daily with breakfast.   finasteride 5 MG tablet Commonly known as:  PROSCAR Take 5 mg by mouth daily with breakfast.   furosemide 40 MG tablet Commonly known as:  LASIX Take 1.5 tablets (60 mg total) by mouth 2 (two) times daily.   glipiZIDE 5 MG tablet Commonly known as:  GLUCOTROL Take 5 mg by mouth 2 (two) times daily before a meal.   losartan 25 MG tablet Commonly known as:  COZAAR Take 25 mg by mouth daily with breakfast.   magnesium oxide 400 MG tablet Commonly known as:  MAG-OX Take 400 mg by mouth every Monday, Wednesday, and  Friday.   metoprolol tartrate 50 MG tablet Commonly known as:  LOPRESSOR Take 1 tablet (50 mg total) by mouth 2 (two) times daily.   protein supplement shake Liqd Commonly known as:  PREMIER PROTEIN Take 325 mLs (11 oz total) by mouth 2 (two) times daily between meals.   terazosin 5 MG capsule Commonly known as:  HYTRIN Take 5 mg by mouth daily with supper.   vitamin B-12 1000 MCG tablet Commonly known as:  CYANOCOBALAMIN Take 1,000 mcg by mouth daily with supper.            Durable Medical Equipment        Start     Ordered   11/11/16 1246  For home use only DME Walker rolling  Once    Question:  Patient needs a walker to treat with the following condition  Answer:  Balance problem   11/11/16 1246     Follow-up Information    [facility]. Schedule an appointment as soon as possible for a visit in 1 week(s).   Specialty:  General Practice Why:  hospital follow up Contact information: [address] [place] [address] [phone]        Antoine Poche, MD. Schedule an appointment as soon as possible for a visit in 1 week(s).   Specialty:  Cardiology Why:  hospital follow up Contact information: [address] [place] [address] [phone]          No Known Allergies  Consultations:  Cardiology    Procedures/Studies: Nm Pulmonary Perf And Vent  Result Date: 11/09/2016 CLINICAL DATA:  71 year old male with shortness of breath. EXAM: NUCLEAR MEDICINE VENTILATION - PERFUSION LUNG SCAN TECHNIQUE: Ventilation images were obtained in multiple projections using inhaled aerosol Tc-67m DTPA. Perfusion images were obtained in multiple projections after intravenous injection of Tc-48m MAA. RADIOPHARMACEUTICALS:  31.9 mCi Technetium-35m DTPA aerosol inhalation and 4.4 mCi Technetium-2m MAA IV COMPARISON:  11/09/2016 chest radiograph FINDINGS: Ventilation: Decreased ventilation within the left lower lung noted. Perfusion: Decreased perfusion  within the left lower lung noted. No other significant perfusion abnormalities noted. IMPRESSION: Low probability for pulmonary embolus (10-19%). Moderate to large matching ventilation/perfusion defect within the  left lower lobe. In correlation with recent chest radiograph, this probably represents left lower lung consolidation/airspace disease. Electronically Signed   By: Harmon Pier M.D.   On: 11/09/2016 18:30   Dg Chest Port 1 View  Result Date: 11/13/2016 CLINICAL DATA:  CHF EXAM: PORTABLE CHEST 1 VIEW COMPARISON:  11/10/2016 FINDINGS: Bibasilar airspace disease left greater than right unchanged. Mild vascular congestion without edema or effusion. Cardiac enlargement. IMPRESSION: Cardiac enlargement with vascular congestion unchanged Bibasilar atelectasis/ infiltrate unchanged Electronically Signed   By: Marlan Palau M.D.   On: 11/13/2016 15:10   Dg Chest Port 1 View  Result Date: 11/10/2016 CLINICAL DATA:  Short of breath EXAM: PORTABLE CHEST 1 VIEW COMPARISON:  11/09/2016 FINDINGS: Pulmonary vascular congestion unchanged.  Negative for edema. Bibasilar airspace disease similar to the prior study, left greater than right negative for effusion IMPRESSION: Pulmonary vascular congestion unchanged Bibasilar airspace disease left greater than right unchanged Electronically Signed   By: Marlan Palau M.D.   On: 11/10/2016 12:33   Dg Chest Port 1 View  Result Date: 11/09/2016 CLINICAL DATA:  Shortness of breath EXAM: PORTABLE CHEST 1 VIEW COMPARISON:  01/29/2016 FINDINGS: Cardiomegaly and pulmonary vascular congestion. No air bronchogram or Kerley lines. No effusion or pneumothorax. Low lung volumes. IMPRESSION: Low volume chest with cardiomegaly and pulmonary vascular congestion. Electronically Signed   By: Marnee Spring M.D.   On: 11/09/2016 09:19    ECHO 11/11/16 ------------------------------------------------------------------- Study Conclusions  - Left ventricle: The cavity size was  normal. Wall thickness was   increased in a pattern of moderate LVH. Systolic function was   normal. The estimated ejection fraction was in the range of 55%   to 60%. The study is not technically sufficient to allow   evaluation of LV diastolic function. - Mitral valve: Calcified annulus. Mildly thickened leaflets . - Left atrium: The atrium was moderately dilated. - Right ventricle: Not well seen on subcostal appears mildly   dilated on apical views.  Discharge Exam: Vitals:   11/14/16 2300 11/15/16 0533  BP:  134/79  Pulse: 74 62  Resp:  16  Temp:  98.2 F (36.8 C)   Vitals:   11/14/16 1500 11/14/16 2112 11/14/16 2300 11/15/16 0533  BP: 137/72 123/73  134/79  Pulse: 62 (!) 136 74 62  Resp: 18 16  16   Temp: 98.2 F (36.8 C) 98.7 F (37.1 C)  98.2 F (36.8 C)  TempSrc: Oral Oral  Oral  SpO2: 93% 94% 93% 90%  Weight:    134 kg (295 lb 6.7 oz)  Height:        General: Pt is alert, awake, not in acute distress Cardiovascular: RRR, S1/S2 +, no rubs, no gallops Respiratory: Good air entry, breath sounds mild decrease at the b/l bases  Abdominal:  Obese, Soft, NT, ND, bowel sounds + Extremities: Chronic venous stasis, trace LE edema, no cyanosis   The results of significant diagnostics from this hospitalization (including imaging, microbiology, ancillary and laboratory) are listed below for reference.     Microbiology: No results found for this or any previous visit (from the past 240 hour(s)).   Labs: BNP (last 3 results)  Recent Labs  01/29/16 2046 01/30/16 0157 11/09/16 0859  BNP 91.2 130.4* 93.3   Basic Metabolic Panel:  Recent Labs Lab 11/10/16 0558 11/11/16 0729 11/12/16 0436 11/14/16 0510 11/15/16 0743  NA 148* 141 141 140 138  K 5.1 5.0 4.7 4.6 5.2*  CL 101 103 98* 97* 95*  CO2 35*  30 33* 36* 33*  GLUCOSE 122* 126* 136* 139* 125*  BUN 32* 31* 31* 44* 49*  CREATININE 2.34* 2.23* 2.06* 2.00* 1.80*  CALCIUM 9.8 8.7* 9.0 9.3 9.4   Liver  Function Tests:  Recent Labs Lab 11/09/16 0859 11/10/16 0558  AST 13* 14*  ALT 10* 11*  ALKPHOS 46 44  BILITOT 0.7 0.3  PROT 6.8 6.7  ALBUMIN 3.4* 3.3*   No results for input(s): LIPASE, AMYLASE in the last 168 hours. No results for input(s): AMMONIA in the last 168 hours. CBC:  Recent Labs Lab 11/09/16 0859 11/10/16 0558 11/11/16 0729 11/12/16 0436  WBC 6.5 7.2 7.5 7.3  NEUTROABS 4.7  --   --  4.9  HGB 11.0* 11.0* 10.8* 11.3*  HCT 36.3* 37.7* 36.6* 38.2*  MCV 86.0 88.9 88.6 88.4  PLT 208 206 189 217   Cardiac Enzymes:  Recent Labs Lab 11/09/16 2046 11/10/16 0558 11/10/16 1045 11/11/16 0729  TROPONINI 0.06* 0.06* 0.06* 0.06*   BNP: Invalid input(s): POCBNP CBG:  Recent Labs Lab 11/14/16 0753 11/14/16 1202 11/14/16 1718 11/14/16 2107 11/15/16 0805  GLUCAP 125* 155* 142* 183* 120*   D-Dimer No results for input(s): DDIMER in the last 72 hours. Hgb A1c No results for input(s): HGBA1C in the last 72 hours. Lipid Profile No results for input(s): CHOL, HDL, LDLCALC, TRIG, CHOLHDL, LDLDIRECT in the last 72 hours. Thyroid function studies No results for input(s): TSH, T4TOTAL, T3FREE, THYROIDAB in the last 72 hours.  Invalid input(s): FREET3 Anemia work up No results for input(s): VITAMINB12, FOLATE, FERRITIN, TIBC, IRON, RETICCTPCT in the last 72 hours. Urinalysis    Component Value Date/Time   COLORURINE YELLOW 11/09/2016 1102   APPEARANCEUR CLEAR 11/09/2016 1102   LABSPEC 1.011 11/09/2016 1102   PHURINE 7.0 11/09/2016 1102   GLUCOSEU 50 (A) 11/09/2016 1102   HGBUR NEGATIVE 11/09/2016 1102   BILIRUBINUR NEGATIVE 11/09/2016 1102   KETONESUR NEGATIVE 11/09/2016 1102   PROTEINUR 30 (A) 11/09/2016 1102   UROBILINOGEN 0.2 07/15/2009 2249   NITRITE NEGATIVE 11/09/2016 1102   LEUKOCYTESUR NEGATIVE 11/09/2016 1102   Sepsis Labs Invalid input(s): PROCALCITONIN,  WBC,  LACTICIDVEN Microbiology No results found for this or any previous visit (from  the past 240 hour(s)).   Time coordinating discharge: 35 minutes  SIGNED:  Latrelle Dodrill, MD  [facility] 11/15/2016, 1:13 PM  Pager please text page via  [email or web address] Password [email or web address]

## 2016-11-15 NOTE — Progress Notes (Signed)
Progress Note  Patient Name: Fernando [name] Date of Encounter: 11/15/2016  Primary Cardiologist: Mayford Knife ([place])   Subjective   71 y.o.malewith a history of DM, HTN, chronic venous insuff, CKD, morbid obesity but no history of cardiac disease, who presented to [facility] ER with complaints of DOE. Of note, he has CKD stage 4 with on ly 1 kidney s/p nephrotomy due to staghorn calculus. Being seen this admission for acute diastolic HF and PSVT.  Ambulated without oxygen yesterday and maintained his O2 sats. He was supposed to wear his CPAP last night but he his wife said he took it off. No new creatinine lab values this morning. He diuresed approx 2 l of fluids yesterday. He says he is feeling better "about normal" now.  Inpatient Medications    Scheduled Meds: . allopurinol  100 mg Oral Q breakfast  . ammonium lactate   Topical BID  . aspirin EC  81 mg Oral Daily  . donepezil  10 mg Oral Q breakfast  . finasteride  5 mg Oral Q breakfast  . furosemide  60 mg Intravenous BID  . heparin  5,000 Units Subcutaneous Q8H  . insulin aspart  0-15 Units Subcutaneous TID WC  . metoprolol tartrate  50 mg Oral BID  . protein supplement shake  11 oz Oral BID BM  . sodium chloride flush  3 mL Intravenous Q12H  . sodium chloride flush  3 mL Intravenous Q12H  . vitamin B-12  1,000 mcg Oral Q supper   Continuous Infusions: . sodium chloride     PRN Meds: sodium chloride, acetaminophen **OR** acetaminophen, ipratropium-albuterol, ondansetron **OR** ondansetron (ZOFRAN) IV, sodium chloride, sodium chloride flush, technetium TC 25M diethylenetriame-pentaacetic acid   Vital Signs    Vitals:   11/14/16 1500 11/14/16 2112 11/14/16 2300 11/15/16 0533  BP: 137/72 123/73  134/79  Pulse: 62 (!) 136 74 62  Resp: 18 16  16   Temp: 98.2 F (36.8 C) 98.7 F (37.1 C)  98.2 F (36.8 C)  TempSrc: Oral Oral  Oral  SpO2: 93% 94% 93% 90%  Weight:    295 lb 6.7 oz (134 kg)  Height:        Intake/Output  Summary (Last 24 hours) at 11/15/16 0714 Last data filed at 11/15/16 0500  Gross per 24 hour  Intake              840 ml  Output             2750 ml  Net            -1910 ml   Filed Weights   11/13/16 0451 11/14/16 0519 11/15/16 0533  Weight: (!) 304 lb 0.2 oz (137.9 kg) 299 lb 2.6 oz (135.7 kg) 295 lb 6.7 oz (134 kg)    Telemetry    SR, HR 60's. Between 8-9pm he had frequent episodes of sinus tachycardia HR 140s. - Personally Reviewed   Physical Exam   GEN: Well nourished, well developed.  Morbidly obese. HEENT: normal  Neck: no JVD, carotid bruits, or masses Cardiac: RRR. no murmurs, rubs, or gallops,no edema. Intact distal pulses bilaterally.  Respiratory: Normal work of breathing, + expiratory wheezing. GI: soft, nontender, nondistended, + BS MS: no deformity or atrophy  Skin: warm and dry, no rash Neuro: Alert and Oriented x 3, Strength and sensation are intact Psych:   Full affect  Labs    Chemistry Recent Labs Lab 11/09/16 1610 11/10/16 9604 11/11/16 5409 11/12/16 0436 11/14/16 0510  NA 140 148* 141 141 140  K 4.4 5.1 5.0 4.7 4.6  CL 103 101 103 98* 97*  CO2 31 35* 30 33* 36*  GLUCOSE 130* 122* 126* 136* 139*  BUN 36* 32* 31* 31* 44*  CREATININE 2.39* 2.34* 2.23* 2.06* 2.00*  CALCIUM 8.9 9.8 8.7* 9.0 9.3  PROT 6.8 6.7  --   --   --   ALBUMIN 3.4* 3.3*  --   --   --   AST 13* 14*  --   --   --   ALT 10* 11*  --   --   --   ALKPHOS 46 44  --   --   --   BILITOT 0.7 0.3  --   --   --   GFRNONAA 26* 26* 28* 31* 32*  GFRAA 30* 31* 32* 36* 37*  ANIONGAP 6 12 8 10 7      Hematology Recent Labs Lab 11/10/16 0558 11/11/16 0729 11/12/16 0436  WBC 7.2 7.5 7.3  RBC 4.24  4.24 4.13* 4.32  HGB 11.0* 10.8* 11.3*  HCT 37.7* 36.6* 38.2*  MCV 88.9 88.6 88.4  MCH 25.9* 26.2 26.2  MCHC 29.2* 29.5* 29.6*  RDW 15.2 14.8 14.5  PLT 206 189 217    Cardiac Enzymes Recent Labs Lab 11/09/16 2046 11/10/16 0558 11/10/16 1045 11/11/16 0729  TROPONINI  0.06* 0.06* 0.06* 0.06*    Recent Labs Lab 11/09/16 0900  TROPIPOC 0.01     BNP Recent Labs Lab 11/09/16 0859  BNP 93.3     DDimer No results for input(s): DDIMER in the last 168 hours.   Radiology    Dg Chest Port 1 View  Result Date: 11/13/2016 CLINICAL DATA:  CHF EXAM: PORTABLE CHEST 1 VIEW COMPARISON:  11/10/2016 FINDINGS: Bibasilar airspace disease left greater than right unchanged. Mild vascular congestion without edema or effusion. Cardiac enlargement. IMPRESSION: Cardiac enlargement with vascular congestion unchanged Bibasilar atelectasis/ infiltrate unchanged Electronically Signed   By: Marlan Palau M.D.   On: 11/13/2016 15:10    Cardiac Studies   ECHO 11/11/16 - Left ventricle: The cavity size was normal. Wall thickness was increased in a pattern of moderate LVH. Systolic function was normal. The estimated ejection fraction was in the range of 55% to 60%. The study is not technically sufficient to allow evaluation of LV diastolic function. - Mitral valve: Calcified annulus. Mildly thickened leaflets . - Left atrium: The atrium was moderately dilated. - Right ventricle: Not well seen on subcostal appears mildly dilated on apical views.  Patient Profile     71 y.o. male with a history of DM, HTN, chronic venous insuff, CKD, morbid obesity but no history of cardiac disease, who presented to [facility] ER with complaints of DOE. This has been occurring over several weeks. He was seen by his nephrologist on 6/12 who recommended admission to ER. ER workup at that time included normal CXRAY and EKG and he was sent home. His symptoms worsened and he returned to the ER with hypoxia with O2 sats in the mid 80's and Cxray c/w acute CHF. He denies any chest pain or pressure but feels he cannot take a deep breath in. While in the ER he apparently went into SVT but spontaneously converted to NSR. His BNP is normal but felt to be due to underlying obesity. VQ scan was  low prob for PE and no evidence of DVT on LE venous dopplers. He was started on IV lasix with good diuresis. Of note, he  has CKD stage 4 with only 1 kidney s/p nephrotomy due to staghorn calculus. His baseline creatinine is around 2.2. Troponin was found to be mildly elevated at 0.06 x 3 and flat.  Assessment & Plan    Net loss since Admission: ~ 6 liters  Admission Weight:  304 lb                   Today's Weight: 295 lb         Blood Pressure: 134/79           Hr: 62   1. Acute diastolic HF, he has diuresed well since yesterday, output close to 2 liters of fluid. He ambulated in the hallways without O2 and maintained his sats and did not require oxygen supplementation yesterday. No repeat BUN or Creatinine lab values this morning, they have had trouble previously obtaining blood due to body habitus.   Will ask nurse to give breathing treatment this morning since he is wheezing. Weight down approx 9 lbs from admission. He is still coughing up phlegm, recommend transitioning to Lasix 60 mg PO today.   2. PSVT: Lopressor increased to 50 mg BID on 11/12/2016, had an episode of tachycardia last night (120-130 hr) that was brief but no episodes of SVT. Continue current dose as he is tolerating well.  3. Elevated Troponin: Flat troponins likely related to demand ischemia and not ACS, ischemic testing deferred at this time. Normal LVEF on echo.  4. Obesity hypoventilation syndrome: Non compliant with CPAP last night, took it off. Wife reports they plan to get a sleep study done outpatient. Patient morbidly obese and needs to loose a significant amount of weight.  Signed, Dorthula MatasGREENE,TIFFANY G, PA-C  11/15/2016, 7:14 AM    Personally seen and examined. Agree with above. Doing better, less SOB, no CP Lungs CTAB, obese, RRR  HF  - improved  - agree with home lasix as above  PSVT  - metop increased as above  Demand ischemia  - in the setting of HF  Recommend sleep study  OK with  DC  Donato SchultzMark Skains, MD

## 2016-11-16 DIAGNOSIS — I130 Hypertensive heart and chronic kidney disease with heart failure and stage 1 through stage 4 chronic kidney disease, or unspecified chronic kidney disease: Secondary | ICD-10-CM | POA: Diagnosis not present

## 2016-11-16 DIAGNOSIS — M1990 Unspecified osteoarthritis, unspecified site: Secondary | ICD-10-CM | POA: Diagnosis not present

## 2016-11-16 DIAGNOSIS — Z87891 Personal history of nicotine dependence: Secondary | ICD-10-CM | POA: Diagnosis not present

## 2016-11-16 DIAGNOSIS — E1122 Type 2 diabetes mellitus with diabetic chronic kidney disease: Secondary | ICD-10-CM | POA: Diagnosis not present

## 2016-11-16 DIAGNOSIS — I872 Venous insufficiency (chronic) (peripheral): Secondary | ICD-10-CM | POA: Diagnosis not present

## 2016-11-16 DIAGNOSIS — D631 Anemia in chronic kidney disease: Secondary | ICD-10-CM | POA: Diagnosis not present

## 2016-11-16 DIAGNOSIS — N184 Chronic kidney disease, stage 4 (severe): Secondary | ICD-10-CM | POA: Diagnosis not present

## 2016-11-16 DIAGNOSIS — Z6841 Body Mass Index (BMI) 40.0 and over, adult: Secondary | ICD-10-CM | POA: Diagnosis not present

## 2016-11-16 DIAGNOSIS — I5031 Acute diastolic (congestive) heart failure: Secondary | ICD-10-CM | POA: Diagnosis not present

## 2016-11-18 ENCOUNTER — Emergency Department (HOSPITAL_COMMUNITY): Payer: Medicare Other

## 2016-11-18 ENCOUNTER — Encounter (HOSPITAL_COMMUNITY): Payer: Self-pay | Admitting: Emergency Medicine

## 2016-11-18 ENCOUNTER — Inpatient Hospital Stay (HOSPITAL_COMMUNITY)
Admission: EM | Admit: 2016-11-18 | Discharge: 2016-11-20 | DRG: 683 | Disposition: A | Discharge status: Home / Self Care | Payer: Medicare Other | Attending: Internal Medicine | Admitting: Internal Medicine

## 2016-11-18 DIAGNOSIS — N179 Acute kidney failure, unspecified: Principal | ICD-10-CM | POA: Diagnosis present

## 2016-11-18 DIAGNOSIS — Z905 Acquired absence of kidney: Secondary | ICD-10-CM

## 2016-11-18 DIAGNOSIS — Z6841 Body Mass Index (BMI) 40.0 and over, adult: Secondary | ICD-10-CM

## 2016-11-18 DIAGNOSIS — I130 Hypertensive heart and chronic kidney disease with heart failure and stage 1 through stage 4 chronic kidney disease, or unspecified chronic kidney disease: Secondary | ICD-10-CM | POA: Diagnosis present

## 2016-11-18 DIAGNOSIS — I5032 Chronic diastolic (congestive) heart failure: Secondary | ICD-10-CM | POA: Diagnosis not present

## 2016-11-18 DIAGNOSIS — E119 Type 2 diabetes mellitus without complications: Secondary | ICD-10-CM

## 2016-11-18 DIAGNOSIS — Z87891 Personal history of nicotine dependence: Secondary | ICD-10-CM

## 2016-11-18 DIAGNOSIS — E1122 Type 2 diabetes mellitus with diabetic chronic kidney disease: Secondary | ICD-10-CM

## 2016-11-18 DIAGNOSIS — N183 Chronic kidney disease, stage 3 (moderate): Secondary | ICD-10-CM | POA: Diagnosis present

## 2016-11-18 DIAGNOSIS — K921 Melena: Secondary | ICD-10-CM | POA: Diagnosis present

## 2016-11-18 DIAGNOSIS — Z7984 Long term (current) use of oral hypoglycemic drugs: Secondary | ICD-10-CM | POA: Diagnosis not present

## 2016-11-18 DIAGNOSIS — I10 Essential (primary) hypertension: Secondary | ICD-10-CM | POA: Diagnosis not present

## 2016-11-18 DIAGNOSIS — M1990 Unspecified osteoarthritis, unspecified site: Secondary | ICD-10-CM | POA: Diagnosis not present

## 2016-11-18 DIAGNOSIS — I959 Hypotension, unspecified: Secondary | ICD-10-CM

## 2016-11-18 DIAGNOSIS — T502X5A Adverse effect of carbonic-anhydrase inhibitors, benzothiadiazides and other diuretics, initial encounter: Secondary | ICD-10-CM | POA: Diagnosis not present

## 2016-11-18 DIAGNOSIS — Z79899 Other long term (current) drug therapy: Secondary | ICD-10-CM

## 2016-11-18 DIAGNOSIS — Z7982 Long term (current) use of aspirin: Secondary | ICD-10-CM

## 2016-11-18 DIAGNOSIS — R0602 Shortness of breath: Secondary | ICD-10-CM | POA: Diagnosis not present

## 2016-11-18 LAB — COMPREHENSIVE METABOLIC PANEL
ALBUMIN: 3.4 g/dL — AB (ref 3.5–5.0)
ALK PHOS: 44 U/L (ref 38–126)
ALT: 15 U/L — AB (ref 17–63)
AST: 16 U/L (ref 15–41)
Anion gap: 11 (ref 5–15)
BUN: 75 mg/dL — ABNORMAL HIGH (ref 6–20)
CALCIUM: 8.7 mg/dL — AB (ref 8.9–10.3)
CHLORIDE: 94 mmol/L — AB (ref 101–111)
CO2: 28 mmol/L (ref 22–32)
CREATININE: 3.52 mg/dL — AB (ref 0.61–1.24)
GFR calc non Af Amer: 16 mL/min — ABNORMAL LOW (ref >60)
GFR, EST AFRICAN AMERICAN: 19 mL/min — AB (ref >60)
GLUCOSE: 114 mg/dL — AB (ref 65–99)
Potassium: 4 mmol/L (ref 3.5–5.1)
SODIUM: 133 mmol/L — AB (ref 135–145)
Total Bilirubin: 0.6 mg/dL (ref 0.3–1.2)
Total Protein: 6.7 g/dL (ref 6.5–8.1)

## 2016-11-18 LAB — I-STAT CHEM 8, ED
BUN: 86 mg/dL — AB (ref 6–20)
CHLORIDE: 92 mmol/L — AB (ref 101–111)
CREATININE: 3.8 mg/dL — AB (ref 0.61–1.24)
Calcium, Ion: 1.06 mmol/L — ABNORMAL LOW (ref 1.15–1.40)
Glucose, Bld: 151 mg/dL — ABNORMAL HIGH (ref 65–99)
HEMATOCRIT: 37 % — AB (ref 39.0–52.0)
Hemoglobin: 12.6 g/dL — ABNORMAL LOW (ref 13.0–17.0)
Potassium: 4.7 mmol/L (ref 3.5–5.1)
SODIUM: 132 mmol/L — AB (ref 135–145)
TCO2: 33 mmol/L (ref 0–100)

## 2016-11-18 LAB — CBC
HCT: 34.5 % — ABNORMAL LOW (ref 39.0–52.0)
HEMOGLOBIN: 10.8 g/dL — AB (ref 13.0–17.0)
MCH: 26.2 pg (ref 26.0–34.0)
MCHC: 31.3 g/dL (ref 30.0–36.0)
MCV: 83.7 fL (ref 78.0–100.0)
PLATELETS: 222 10*3/uL (ref 150–400)
RBC: 4.12 MIL/uL — AB (ref 4.22–5.81)
RDW: 15.4 % (ref 11.5–15.5)
WBC: 9.1 10*3/uL (ref 4.0–10.5)

## 2016-11-18 LAB — PROTIME-INR
INR: 1
PROTHROMBIN TIME: 13.2 s (ref 11.4–15.2)

## 2016-11-18 LAB — I-STAT CG4 LACTIC ACID, ED: LACTIC ACID, VENOUS: 0.86 mmol/L (ref 0.5–1.9)

## 2016-11-18 LAB — URINALYSIS, ROUTINE W REFLEX MICROSCOPIC
BILIRUBIN URINE: NEGATIVE
GLUCOSE, UA: NEGATIVE mg/dL
Hgb urine dipstick: NEGATIVE
KETONES UR: NEGATIVE mg/dL
LEUKOCYTES UA: NEGATIVE
NITRITE: NEGATIVE
PROTEIN: NEGATIVE mg/dL
Specific Gravity, Urine: 1.005 (ref 1.005–1.030)
pH: 6 (ref 5.0–8.0)

## 2016-11-18 LAB — DIFFERENTIAL
BASOS ABS: 0 10*3/uL (ref 0.0–0.1)
BASOS PCT: 0 %
EOS ABS: 0.1 10*3/uL (ref 0.0–0.7)
EOS PCT: 2 %
LYMPHS ABS: 1.2 10*3/uL (ref 0.7–4.0)
Lymphocytes Relative: 13 %
MONOS PCT: 12 %
Monocytes Absolute: 1.1 10*3/uL — ABNORMAL HIGH (ref 0.1–1.0)
NEUTROS PCT: 73 %
Neutro Abs: 6.7 10*3/uL (ref 1.7–7.7)

## 2016-11-18 LAB — TSH: TSH: 1.487 u[IU]/mL (ref 0.350–4.500)

## 2016-11-18 LAB — GLUCOSE, CAPILLARY: GLUCOSE-CAPILLARY: 84 mg/dL (ref 65–99)

## 2016-11-18 LAB — TYPE AND SCREEN
ABO/RH(D): O POS
Antibody Screen: NEGATIVE

## 2016-11-18 LAB — BRAIN NATRIURETIC PEPTIDE: B Natriuretic Peptide: 23.3 pg/mL (ref 0.0–100.0)

## 2016-11-18 LAB — ABO/RH: ABO/RH(D): O POS

## 2016-11-18 MED ORDER — CHLORHEXIDINE GLUCONATE 0.12 % MT SOLN
15.0000 mL | Freq: Two times a day (BID) | OROMUCOSAL | Status: DC
  Administered 2016-11-19 – 2016-11-20 (×3): 15 mL via OROMUCOSAL
  Filled 2016-11-18 (×4): qty 15

## 2016-11-18 MED ORDER — HEPARIN SODIUM (PORCINE) 5000 UNIT/ML IJ SOLN
5000.0000 [IU] | Freq: Three times a day (TID) | INTRAMUSCULAR | Status: DC
  Administered 2016-11-18 – 2016-11-20 (×4): 5000 [IU] via SUBCUTANEOUS
  Filled 2016-11-18 (×4): qty 1

## 2016-11-18 MED ORDER — SODIUM CHLORIDE 0.9 % IV BOLUS (SEPSIS)
500.0000 mL | Freq: Once | INTRAVENOUS | Status: AC
  Administered 2016-11-18: 500 mL via INTRAVENOUS

## 2016-11-18 MED ORDER — ACETAMINOPHEN 650 MG RE SUPP: 650.0000 mg | Freq: Four times a day (QID) | RECTAL | Status: DC | PRN

## 2016-11-18 MED ORDER — DONEPEZIL HCL 10 MG PO TABS
10.0000 mg | ORAL_TABLET | Freq: Every day | ORAL | Status: DC
  Administered 2016-11-19 – 2016-11-20 (×2): 10 mg via ORAL
  Filled 2016-11-18 (×2): qty 1

## 2016-11-18 MED ORDER — INSULIN ASPART 100 UNIT/ML ~~LOC~~ SOLN
0.0000 [IU] | Freq: Three times a day (TID) | SUBCUTANEOUS | Status: DC
  Administered 2016-11-19 – 2016-11-20 (×2): 1 [IU] via SUBCUTANEOUS

## 2016-11-18 MED ORDER — SODIUM CHLORIDE 0.9 % IV SOLN
Freq: Once | INTRAVENOUS | Status: AC
  Administered 2016-11-18: 15:00:00 via INTRAVENOUS

## 2016-11-18 MED ORDER — METOPROLOL TARTRATE 50 MG PO TABS
50.0000 mg | ORAL_TABLET | Freq: Two times a day (BID) | ORAL | Status: DC
  Administered 2016-11-18 – 2016-11-20 (×4): 50 mg via ORAL
  Filled 2016-11-18 (×4): qty 1

## 2016-11-18 MED ORDER — SODIUM CHLORIDE 0.9 % IV BOLUS (SEPSIS): 1000.0000 mL | Freq: Once | INTRAVENOUS | Status: DC

## 2016-11-18 MED ORDER — ONDANSETRON HCL 4 MG/2ML IJ SOLN: 4.0000 mg | Freq: Four times a day (QID) | INTRAMUSCULAR | Status: DC | PRN

## 2016-11-18 MED ORDER — MAGNESIUM OXIDE 400 (241.3 MG) MG PO TABS
400.0000 mg | ORAL_TABLET | ORAL | Status: DC
  Administered 2016-11-18: 400 mg via ORAL
  Filled 2016-11-18: qty 1

## 2016-11-18 MED ORDER — SODIUM CHLORIDE 0.9 % IV SOLN
INTRAVENOUS | Status: DC
  Administered 2016-11-18 – 2016-11-19 (×2): via INTRAVENOUS

## 2016-11-18 MED ORDER — ONDANSETRON HCL 4 MG PO TABS: 4.0000 mg | ORAL_TABLET | Freq: Four times a day (QID) | ORAL | Status: DC | PRN

## 2016-11-18 MED ORDER — WITCH HAZEL-GLYCERIN EX PADS: MEDICATED_PAD | CUTANEOUS | Status: DC | PRN

## 2016-11-18 MED ORDER — ORAL CARE MOUTH RINSE
15.0000 mL | Freq: Two times a day (BID) | OROMUCOSAL | Status: DC
  Administered 2016-11-19 (×2): 15 mL via OROMUCOSAL

## 2016-11-18 MED ORDER — HYDROCODONE-ACETAMINOPHEN 5-325 MG PO TABS: 1.0000 | ORAL_TABLET | ORAL | Status: DC | PRN

## 2016-11-18 MED ORDER — TERAZOSIN HCL 5 MG PO CAPS
5.0000 mg | ORAL_CAPSULE | Freq: Every day | ORAL | Status: DC
  Administered 2016-11-18 – 2016-11-19 (×2): 5 mg via ORAL
  Filled 2016-11-18 (×2): qty 1

## 2016-11-18 MED ORDER — POLYETHYLENE GLYCOL 3350 17 G PO PACK
17.0000 g | PACK | Freq: Every day | ORAL | Status: DC
  Administered 2016-11-18 – 2016-11-20 (×3): 17 g via ORAL
  Filled 2016-11-18 (×3): qty 1

## 2016-11-18 MED ORDER — ACETAMINOPHEN 325 MG PO TABS
650.0000 mg | ORAL_TABLET | Freq: Four times a day (QID) | ORAL | Status: DC | PRN
Start: 2016-11-18 — End: 2016-11-20

## 2016-11-18 MED ORDER — ASPIRIN EC 81 MG PO TBEC
81.0000 mg | DELAYED_RELEASE_TABLET | Freq: Every day | ORAL | Status: DC
  Administered 2016-11-19 – 2016-11-20 (×2): 81 mg via ORAL
  Filled 2016-11-18 (×2): qty 1

## 2016-11-18 NOTE — ED Provider Notes (Signed)
[facility]-EMERGENCY DEPT Provider Note   CSN: [id number] Arrival date & time: 11/18/16  1001     History   Chief Complaint Chief Complaint  Patient presents with  . Rectal Bleeding    HPI Fernando [name] is a 71 y.o. male.  HPI 71 year old male who presents with rectal bleeding. History of HTN, DM, CKD, and diastolic heart failure. Just discharged 2 days ago from hospital for fluid overload. Reports doing well. Today, strained to have a bowel movement and had bright red blood per rectum. Unsure of how much blood. No melena history, n/v, hematemesis, abdominal pain, syncope or near syncope, chest pain or dyspnea. No prior GI bleed history. Takes ASA baby but no other blood thinners. They took his BP after the incident and it was very low. Wife called Gi doctor who recommended taht he come to ED  Past Medical History:  Diagnosis Date  . Arthritis   . Diabetes mellitus without complication (HCC)   . Hypertension   . Renal disorder     Patient Active Problem List   Diagnosis Date Noted  . Acute diastolic CHF (congestive heart failure) (HCC)   . SVT (supraventricular tachycardia) (HCC)   . Elevated troponin   . Morbid obesity (HCC) 11/09/2016  . SOB (shortness of breath) 11/09/2016  . AKI (acute kidney injury) (HCC) 01/29/2016  . Single kidney 01/29/2016  . Renal insufficiency 01/29/2016  . HTN (hypertension) 01/29/2016  . Diabetes (HCC) 01/29/2016  . CHF (congestive heart failure) (HCC) 01/29/2016  . Hypoxia 01/29/2016  . Tachycardia 01/29/2016  . Hypotension 01/29/2016    Past Surgical History:  Procedure Laterality Date  . BACK SURGERY    . NEPHRECTOMY Left 1967       Home Medications    Prior to Admission medications   Medication Sig Start Date End Date Taking? Authorizing Provider  allopurinol (ZYLOPRIM) 100 MG tablet Take 100 mg by mouth daily with breakfast.   Yes [provider]  ammonium lactate (LAC-HYDRIN) 12 % lotion Apply topically 2 (two)  times daily. 11/15/16  Yes Randel Pigg, Dorma Russell, MD  aspirin EC 81 MG tablet Take 1 tablet (81 mg total) by mouth daily. 11/15/16  Yes Lenox Ponds, MD  cholecalciferol (VITAMIN D) 1000 units tablet Take 1,000 Units by mouth daily with breakfast.    Yes [provider]  docusate sodium (COLACE) 50 MG capsule Take 50 mg by mouth 2 (two) times daily.   Yes [provider]  donepezil (ARICEPT) 10 MG tablet Take 10 mg by mouth daily with breakfast.    Yes [provider]  finasteride (PROSCAR) 5 MG tablet Take 5 mg by mouth daily with breakfast.    Yes [provider]  furosemide (LASIX) 40 MG tablet Take 1.5 tablets (60 mg total) by mouth 2 (two) times daily. 11/15/16  Yes Randel Pigg, Dorma Russell, MD  glipiZIDE (GLUCOTROL) 5 MG tablet Take 5 mg by mouth 2 (two) times daily before a meal.   Yes [provider]  losartan (COZAAR) 25 MG tablet Take 25 mg by mouth daily with breakfast.   Yes [provider]  magnesium oxide (MAG-OX) 400 MG tablet Take 400 mg by mouth every Monday, Wednesday, and Friday.    Yes [provider]  metoprolol tartrate (LOPRESSOR) 50 MG tablet Take 1 tablet (50 mg total) by mouth 2 (two) times daily. 11/15/16  Yes Lenox Ponds, MD  protein supplement shake (PREMIER PROTEIN) LIQD Take 325 mLs (11 oz total)  by mouth 2 (two) times daily between meals. 11/15/16  Yes Randel Pigg, Dorma Russell, MD  terazosin (HYTRIN) 5 MG capsule Take 5 mg by mouth daily with supper.    Yes [provider]  vitamin B-12 (CYANOCOBALAMIN) 1000 MCG tablet Take 1,000 mcg by mouth daily with supper.    Yes [provider]    Family History Family History  Problem Relation Age of Onset  . Heart disease Mother     Social History Social History  Substance Use Topics  . Smoking status: Former Smoker    Quit date: 05/30/1970  . Smokeless tobacco: Never Used  . Alcohol use No     Allergies   Ibuprofen   Review of  Systems Review of Systems  Constitutional: Negative for fever.  Respiratory: Negative for shortness of breath.   Cardiovascular: Negative for chest pain.  Gastrointestinal: Positive for blood in stool. Negative for abdominal pain.  Genitourinary: Negative for difficulty urinating.  Neurological: Negative for syncope.  All other systems reviewed and are negative.    Physical Exam Updated Vital Signs BP 122/63 (BP Location: Right Arm)   Pulse 73   Temp 98.1 F (36.7 C) (Oral)   Resp (!) 21   Ht 6' (1.829 m)   Wt 133.8 kg (295 lb)   SpO2 94%   BMI 40.01 kg/m   Physical Exam Physical Exam  Nursing note and vitals reviewed. Constitutional: Well developed, well nourished, non-toxic, and in no acute distress Head: Normocephalic and atraumatic.  Mouth/Throat: Oropharynx is clear and moist.  Neck: Normal range of motion. Neck supple.  Cardiovascular: Normal rate and regular rhythm.   Pulmonary/Chest: Effort normal and breath sounds normal.  Abdominal: Soft. There is no tenderness. There is no rebound and no guarding. Rectal exam with brown stool, and streak of blood.  Musculoskeletal: Normal range of motion.  Neurological: Alert, no facial droop, fluent speech, moves all extremities symmetrically Skin: Skin is warm and dry.  Psychiatric: Cooperative   ED Treatments / Results  Labs (all labs ordered are listed, but only abnormal results are displayed) Labs Reviewed  COMPREHENSIVE METABOLIC PANEL - Abnormal; Notable for the following:       Result Value   Sodium 133 (*)    Chloride 94 (*)    Glucose, Bld 114 (*)    BUN 75 (*)    Creatinine, Ser 3.52 (*)    Calcium 8.7 (*)    Albumin 3.4 (*)    ALT 15 (*)    GFR calc non Af Amer 16 (*)    GFR calc Af Amer 19 (*)    All other components within normal limits  CBC - Abnormal; Notable for the following:    RBC 4.12 (*)    Hemoglobin 10.8 (*)    HCT 34.5 (*)    All other components within normal limits  DIFFERENTIAL -  Abnormal; Notable for the following:    Monocytes Absolute 1.1 (*)    All other components within normal limits  I-STAT CHEM 8, ED - Abnormal; Notable for the following:    Sodium 132 (*)    Chloride 92 (*)    BUN 86 (*)    Creatinine, Ser 3.80 (*)    Glucose, Bld 151 (*)    Calcium, Ion 1.06 (*)    Hemoglobin 12.6 (*)    HCT 37.0 (*)    All other components within normal limits  BRAIN NATRIURETIC PEPTIDE  URINALYSIS, ROUTINE W REFLEX MICROSCOPIC  I-STAT CG4 LACTIC ACID,  ED  TYPE AND SCREEN  ABO/RH    EKG  EKG Interpretation  Date/Time:  Friday November 18 2016 11:15:05 EDT Ventricular Rate:  74 PR Interval:    QRS Duration: 108 QT Interval:  408 QTC Calculation: 453 R Axis:   91 Text Interpretation:  Sinus rhythm Borderline prolonged PR interval Right axis deviation Low voltage, precordial leads no acute changes  Confirmed by Crista CurbLiu, Kacey Vicuna [phone]) on 11/18/2016 11:26:44 AM       Radiology Dg Chest Portable 1 View  Result Date: 11/18/2016 CLINICAL DATA:  Shortness of breath. EXAM: PORTABLE CHEST 1 VIEW COMPARISON:  11/13/2016 . FINDINGS: Mild cardiomegaly and mild pulmonary vascular prominence and interstitial prominence with small bilateral pleural effusions suggesting mild CHF. Similar findings on prior exam. Low lung volumes. No pneumothorax. IMPRESSION: 1. Mild CHF again cannot be excluded. Similar findings noted on prior exam. 2. Low lung volumes. Electronically Signed   By: Maisie Fushomas  Register   On: 11/18/2016 11:29    Procedures Procedures (including critical care time)   Angiocath insertion Performed by: Lavera Guiseana Duo Adaleah Forget  Consent: Verbal consent obtained. Risks and benefits: risks, benefits and alternatives were discussed Time out: Immediately prior to procedure a "time out" was called to verify the correct patient, procedure, equipment, support staff and site/side marked as required.  Preparation: Patient was prepped and draped in the usual sterile fashion.  Vein  Location: left AC  Ultrasound Guided  Gauge: 20G  Normal blood return and flush without difficulty Patient tolerance: Patient tolerated the procedure well with no immediate complications.    Medications Ordered in ED Medications  sodium chloride 0.9 % bolus 500 mL (0 mLs Intravenous Stopped 11/18/16 1348)  0.9 %  sodium chloride infusion ( Intravenous New Bag/Given 11/18/16 1454)     Initial Impression / Assessment and Plan / ED Course  I have reviewed the triage vital signs and the nursing notes.  Pertinent labs & imaging results that were available during my care of the patient were reviewed by me and considered in my medical decision making (see chart for details).     Patient presenting with hypotension. He is mentating normally, not acutely ill-appearing. Records are reviewed. He was just discharged from the hospital 2 days ago for acute CHF, was diuresed heavily.  He has a soft and benign abdomen. Rectal exam reveals brown stool, with streak of blood. No further rectal bleeding in the ED. Stable hemoglobin. I feel that his hypotension is not likely related to rectal bleeding.  He is in acute kidney injury with creatinine of around 3.5. He is also 133 kg, and his dry weight upon discharge is between 134 time 135 kg. I feel that his hypotension and acute kidney injury related to over diuresis from his recent hospitalization. He was initially hypotensive with systolic blood pressures in the 80s on arrival. He was given a 500 mL bolus of IV fluids, blood pressure has been in the high 90s to low 100s. Started on slow IV fluids.  He has history of nephrectomy, and thus only one single functioning kidney. Discussed with Dr. Arthor CaptainElmahi who will admit for further treatment and monitoring.  Final Clinical Impressions(s) / ED Diagnoses   Final diagnoses:  Acute kidney injury (HCC)  Hypotension, unspecified hypotension type    New Prescriptions New Prescriptions   No medications on file      Lavera GuiseLiu, Trevyn Lumpkin Duo, MD 11/18/16 1525

## 2016-11-18 NOTE — ED Notes (Signed)
Bed: WA16 Expected date:  Expected time:  Means of arrival:  Comments: Resus A 

## 2016-11-18 NOTE — H&P (Signed)
History and Physical    Fernando GeninRichard A Tripathi WUJ:[id number] DOB: Jul 21, 1945 DOA: 11/18/2016  [facility]  Patient coming from: Home  Chief Complaint: Blood after a BM  HPI: Fernando [name] is a 71 y.o. male with medical history significant of diabetes, HTN, solitary kidney with CKD stage III and morbid obesity patient came into the hospital initially because he bled after bowel movement. Patient reported he was in the hospital recently discharged on the 19th after he was treated for fluid overload and acute diastolic CHF, his diuretics increased from 20 daily to 60 twice a day, he did not feel bad at home. On the day of admission he had to strain for bowel movement so he reported some blood with it, he brought to the hospital by his wife after he complained about weakness as well. In the ED he was found to have acute kidney injury on top of his CKD with creatinine of 3.8, patient deferred for admission for further evaluation He denies shortness of breath, denies fever, denies palpitations and syncopal episodes.  ED Course:  Vitals: WNL Labs: Creatinine 3.8. Imaging: Mild CHF cannot be excluded on CXR Interventions: Started on trickle IV fluids at 100 mL an hour  Review of Systems:  12 point review of systems negative except for symptoms mentioned in the history of present illness.  Past Medical History:  Diagnosis Date  . Arthritis   . Diabetes mellitus without complication (HCC)   . Hypertension   . Renal disorder     Past Surgical History:  Procedure Laterality Date  . BACK SURGERY    . NEPHRECTOMY Left 1967     reports that he quit smoking about 46 years ago. He has never used smokeless tobacco. He reports that he does not drink alcohol or use drugs.  Allergies  Allergen Reactions  . Ibuprofen Other (See Comments)    Due to one kidney    Family History  Problem Relation Age of Onset  . Heart disease Mother     Prior to Admission medications     Medication Sig Start Date End Date Taking? Authorizing Provider  allopurinol (ZYLOPRIM) 100 MG tablet Take 100 mg by mouth daily with breakfast.   Yes [provider]  ammonium lactate (LAC-HYDRIN) 12 % lotion Apply topically 2 (two) times daily. 11/15/16  Yes Randel PiggSilva Zapata, Dorma RussellEdwin, MD  aspirin EC 81 MG tablet Take 1 tablet (81 mg total) by mouth daily. 11/15/16  Yes Lenox PondsSilva Zapata, Edwin, MD  cholecalciferol (VITAMIN D) 1000 units tablet Take 1,000 Units by mouth daily with breakfast.    Yes [provider]  docusate sodium (COLACE) 50 MG capsule Take 50 mg by mouth 2 (two) times daily.   Yes [provider]  donepezil (ARICEPT) 10 MG tablet Take 10 mg by mouth daily with breakfast.    Yes [provider]  finasteride (PROSCAR) 5 MG tablet Take 5 mg by mouth daily with breakfast.    Yes [provider]  furosemide (LASIX) 40 MG tablet Take 1.5 tablets (60 mg total) by mouth 2 (two) times daily. 11/15/16  Yes Randel PiggSilva Zapata, Dorma RussellEdwin, MD  glipiZIDE (GLUCOTROL) 5 MG tablet Take 5 mg by mouth 2 (two) times daily before a meal.   Yes [provider]  losartan (COZAAR) 25 MG tablet Take 25 mg by mouth daily with breakfast.   Yes [provider]  magnesium oxide (MAG-OX) 400 MG tablet Take 400 mg by mouth every Monday, Wednesday, and  Friday.    Yes [provider]  metoprolol tartrate (LOPRESSOR) 50 MG tablet Take 1 tablet (50 mg total) by mouth 2 (two) times daily. 11/15/16  Yes Lenox Ponds, MD  protein supplement shake (PREMIER PROTEIN) LIQD Take 325 mLs (11 oz total) by mouth 2 (two) times daily between meals. 11/15/16  Yes Randel Pigg, Dorma Russell, MD  terazosin (HYTRIN) 5 MG capsule Take 5 mg by mouth daily with supper.    Yes [provider]  vitamin B-12 (CYANOCOBALAMIN) 1000 MCG tablet Take 1,000 mcg by mouth daily with supper.    Yes [provider]    Physical Exam:  Vitals:   11/18/16 1415 11/18/16 1430  11/18/16 1523 11/18/16 1643  BP:  104/67 122/63 100/65  Pulse:   73 73  Resp: 20 18 (!) 21 20  Temp:    98.1 F (36.7 C)  TempSrc:    Oral  SpO2: 92% 92% 94% 96%  Weight:      Height:        Constitutional: NAD, calm, comfortable Eyes: PERRL, lids and conjunctivae normal ENMT: Mucous membranes are moist. Posterior pharynx clear of any exudate or lesions.Normal dentition.  Neck: normal, supple, no masses, no thyromegaly Respiratory: clear to auscultation bilaterally, no wheezing, no crackles. Normal respiratory effort. No accessory muscle use.  Cardiovascular: Regular rate and rhythm, no murmurs / rubs / gallops. 2+ pedal pulses. No carotid bruits.  Abdomen: no tenderness, no masses palpated. No hepatosplenomegaly. Bowel sounds positive.  Musculoskeletal: Has evidence of +1 to +2 chronic edema with chronic skin changes and chronic hemosiderin staining.  Skin: no rashes, lesions, ulcers. No induration Neurologic: CN 2-12 grossly intact. Sensation intact, DTR normal. Strength 5/5 in all 4.  Psychiatric: Normal judgment and insight. Alert and oriented x 3. Normal mood.   Labs on Admission: I have personally reviewed following labs and imaging studies  CBC:  Recent Labs Lab 11/12/16 0436 11/18/16 1042 11/18/16 1339  WBC 7.3  --  9.1  NEUTROABS 4.9  --  6.7  HGB 11.3* 12.6* 10.8*  HCT 38.2* 37.0* 34.5*  MCV 88.4  --  83.7  PLT 217  --  222   Basic Metabolic Panel:  Recent Labs Lab 11/12/16 0436 11/14/16 0510 11/15/16 0743 11/18/16 1042 11/18/16 1339  NA 141 140 138 132* 133*  K 4.7 4.6 5.2* 4.7 4.0  CL 98* 97* 95* 92* 94*  CO2 33* 36* 33*  --  28  GLUCOSE 136* 139* 125* 151* 114*  BUN 31* 44* 49* 86* 75*  CREATININE 2.06* 2.00* 1.80* 3.80* 3.52*  CALCIUM 9.0 9.3 9.4  --  8.7*   GFR: Estimated Creatinine Clearance: 27.3 mL/min (A) (by C-G formula based on SCr of 3.52 mg/dL (H)). Liver Function Tests:  Recent Labs Lab 11/18/16 1339  AST 16  ALT 15*  ALKPHOS  44  BILITOT 0.6  PROT 6.7  ALBUMIN 3.4*   No results for input(s): LIPASE, AMYLASE in the last 168 hours. No results for input(s): AMMONIA in the last 168 hours. Coagulation Profile: No results for input(s): INR, PROTIME in the last 168 hours. Cardiac Enzymes: No results for input(s): CKTOTAL, CKMB, CKMBINDEX, TROPONINI in the last 168 hours. BNP (last 3 results) No results for input(s): PROBNP in the last 8760 hours. HbA1C: No results for input(s): HGBA1C in the last 72 hours. CBG:  Recent Labs Lab 11/14/16 0753 11/14/16 1202 11/14/16 1718 11/14/16 2107 11/15/16 0805  GLUCAP 125* 155* 142* 183* 120*   Lipid  Profile: No results for input(s): CHOL, HDL, LDLCALC, TRIG, CHOLHDL, LDLDIRECT in the last 72 hours. Thyroid Function Tests: No results for input(s): TSH, T4TOTAL, FREET4, T3FREE, THYROIDAB in the last 72 hours. Anemia Panel: No results for input(s): VITAMINB12, FOLATE, FERRITIN, TIBC, IRON, RETICCTPCT in the last 72 hours. Urine analysis:    Component Value Date/Time   COLORURINE STRAW (A) 11/18/2016 1340   APPEARANCEUR CLEAR 11/18/2016 1340   LABSPEC 1.005 11/18/2016 1340   PHURINE 6.0 11/18/2016 1340   GLUCOSEU NEGATIVE 11/18/2016 1340   HGBUR NEGATIVE 11/18/2016 1340   BILIRUBINUR NEGATIVE 11/18/2016 1340   KETONESUR NEGATIVE 11/18/2016 1340   PROTEINUR NEGATIVE 11/18/2016 1340   UROBILINOGEN 0.2 07/15/2009 2249   NITRITE NEGATIVE 11/18/2016 1340   LEUKOCYTESUR NEGATIVE 11/18/2016 1340   Sepsis Labs: !!!!!!!!!!!!!!!!!!!!!!!!!!!!!!!!!!!!!!!!!!!! Invalid input(s): PROCALCITONIN, LACTICIDVEN No results found for this or any previous visit (from the past 240 hour(s)).   Radiological Exams on Admission: Dg Chest Portable 1 View  Result Date: 11/18/2016 CLINICAL DATA:  Shortness of breath. EXAM: PORTABLE CHEST 1 VIEW COMPARISON:  11/13/2016 . FINDINGS: Mild cardiomegaly and mild pulmonary vascular prominence and interstitial prominence with small bilateral  pleural effusions suggesting mild CHF. Similar findings on prior exam. Low lung volumes. No pneumothorax. IMPRESSION: 1. Mild CHF again cannot be excluded. Similar findings noted on prior exam. 2. Low lung volumes. Electronically Signed   By: Maisie Fus  Register   On: 11/18/2016 11:29    EKG: Independently reviewed.   Assessment/Plan Principal Problem:   AKI (acute kidney injury) (HCC) Active Problems:   HTN (hypertension)   Diabetes (HCC)   Morbid obesity (HCC)   Acute kidney injury -Baseline creatinine 1.9 from 11/15/2016, presented with creatinine of 3.8. -Patient is on Lasix 60 mg twice a day and losartan both held. -Started on trickle IV fluids at 75 mL/hour likely need to evaluate in the morning and discontinue. -Consult nephrology in a.m. (patient and his wife asking established care with local nephrologist) -Cannot rule out cardiopulmonary syndrome as patient still has evidence of fluid overload.  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 -Baseline creatinine 1.8, patient reported he was on 20 mg of Lasix prior to previous admission.  Diabetes mellitus type 2 -Started on insulin sliding scale, diabetic diet and check hemoglobin A1c.  Morbid obesity -Weight is 295 with BMI of 40.1, his weight on last admission was 305 on admission.  Chronic diastolic CHF -Does not appear to be an acute decompensation, BNP of 23 and does not have SOB. -Medications restarted except Lasix and losartan.   DVT prophylaxis: SQ Heparin Code Status: Full Code Family Communication: Plan D/W patient and his wife at bedside Disposition Plan: Home Consults called:  Admission status: Observation   [facility] A MD [facility] Pager [phone]  If 7PM-7AM, please contact night-coverage [email or web address] Password [facility]  11/18/2016, 5:37 PM

## 2016-11-18 NOTE — ED Triage Notes (Signed)
Patient had rectal bleeding during the night that was bright red.  Patient unsure if had more than one episode.  Patient denies abd pain or n/v.  Patient reports that he was straining hard to have the BM.  Wife reports that Home Health and GI office referred patient to come to ED.  Patient had low BP at home before coming in.

## 2016-11-18 NOTE — ED Notes (Signed)
Attempted IV placement twice-primary RN will attempt US IV placement

## 2016-11-19 DIAGNOSIS — Z6841 Body Mass Index (BMI) 40.0 and over, adult: Secondary | ICD-10-CM | POA: Diagnosis not present

## 2016-11-19 DIAGNOSIS — I130 Hypertensive heart and chronic kidney disease with heart failure and stage 1 through stage 4 chronic kidney disease, or unspecified chronic kidney disease: Secondary | ICD-10-CM | POA: Diagnosis present

## 2016-11-19 DIAGNOSIS — E1122 Type 2 diabetes mellitus with diabetic chronic kidney disease: Secondary | ICD-10-CM | POA: Diagnosis present

## 2016-11-19 DIAGNOSIS — Z7982 Long term (current) use of aspirin: Secondary | ICD-10-CM | POA: Diagnosis not present

## 2016-11-19 DIAGNOSIS — Z7984 Long term (current) use of oral hypoglycemic drugs: Secondary | ICD-10-CM | POA: Diagnosis not present

## 2016-11-19 DIAGNOSIS — N183 Chronic kidney disease, stage 3 (moderate): Secondary | ICD-10-CM | POA: Diagnosis present

## 2016-11-19 DIAGNOSIS — N179 Acute kidney failure, unspecified: Secondary | ICD-10-CM | POA: Diagnosis not present

## 2016-11-19 DIAGNOSIS — M1990 Unspecified osteoarthritis, unspecified site: Secondary | ICD-10-CM | POA: Diagnosis present

## 2016-11-19 DIAGNOSIS — Z79899 Other long term (current) drug therapy: Secondary | ICD-10-CM | POA: Diagnosis not present

## 2016-11-19 DIAGNOSIS — T502X5A Adverse effect of carbonic-anhydrase inhibitors, benzothiadiazides and other diuretics, initial encounter: Secondary | ICD-10-CM | POA: Diagnosis present

## 2016-11-19 DIAGNOSIS — Z905 Acquired absence of kidney: Secondary | ICD-10-CM | POA: Diagnosis not present

## 2016-11-19 DIAGNOSIS — Z87891 Personal history of nicotine dependence: Secondary | ICD-10-CM | POA: Diagnosis not present

## 2016-11-19 DIAGNOSIS — K921 Melena: Secondary | ICD-10-CM | POA: Diagnosis present

## 2016-11-19 DIAGNOSIS — I5032 Chronic diastolic (congestive) heart failure: Secondary | ICD-10-CM | POA: Diagnosis present

## 2016-11-19 LAB — CBC
HCT: 33.4 % — ABNORMAL LOW (ref 39.0–52.0)
HEMOGLOBIN: 10.8 g/dL — AB (ref 13.0–17.0)
MCH: 26.9 pg (ref 26.0–34.0)
MCHC: 32.3 g/dL (ref 30.0–36.0)
MCV: 83.3 fL (ref 78.0–100.0)
Platelets: 230 10*3/uL (ref 150–400)
RBC: 4.01 MIL/uL — AB (ref 4.22–5.81)
RDW: 15.2 % (ref 11.5–15.5)
WBC: 6.4 10*3/uL (ref 4.0–10.5)

## 2016-11-19 LAB — BASIC METABOLIC PANEL
ANION GAP: 11 (ref 5–15)
BUN: 62 mg/dL — ABNORMAL HIGH (ref 6–20)
CALCIUM: 8.4 mg/dL — AB (ref 8.9–10.3)
CHLORIDE: 103 mmol/L (ref 101–111)
CO2: 23 mmol/L (ref 22–32)
Creatinine, Ser: 2.62 mg/dL — ABNORMAL HIGH (ref 0.61–1.24)
GFR calc non Af Amer: 23 mL/min — ABNORMAL LOW (ref >60)
GFR, EST AFRICAN AMERICAN: 27 mL/min — AB (ref >60)
Glucose, Bld: 100 mg/dL — ABNORMAL HIGH (ref 65–99)
Potassium: 5 mmol/L (ref 3.5–5.1)
Sodium: 137 mmol/L (ref 135–145)

## 2016-11-19 LAB — GLUCOSE, CAPILLARY
GLUCOSE-CAPILLARY: 105 mg/dL — AB (ref 65–99)
GLUCOSE-CAPILLARY: 109 mg/dL — AB (ref 65–99)
Glucose-Capillary: 127 mg/dL — ABNORMAL HIGH (ref 65–99)

## 2016-11-19 LAB — HEMOGLOBIN A1C
HEMOGLOBIN A1C: 7.3 % — AB (ref 4.8–5.6)
Mean Plasma Glucose: 163 mg/dL

## 2016-11-19 NOTE — Progress Notes (Signed)
PROGRESS NOTE    Fernando GeninRichard A [name]  ZOX:[id number] DOB: [birth year] DOA: 11/18/2016 [facility]CP: [facility]     Brief Narrative:  Fernando GeninRichard A Tufo is a 71 y.o. male with medical history significant of diabetes, HTN, solitary kidney with CKD stage III and morbid obesity patient came into the hospital initially because he bled after bowel movement. Patient reported he was in the hospital recently discharged on the 19th after he was treated for fluid overload and acute diastolic CHF, his diuretics increased from 20 daily to 60 twice a day. He was doing well at home until he had a BM that he had to strain for. With straining, he had some blood in his stools and was brought to the hospital. In the ED, he was found to have acute kidney injury on top of his CKD with creatinine of 3.8, patient deferred for admission for further evaluation. He did at one point see [place] [facility] [facility] about 6 months ago.   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 Continue to trend BMP. As he has been improving, I do not see urgent need for patient to be evaluated as inpatient by nephrology. If he worsens over the weekend, I will give them a call. I did ask Dr. Signe ColtUpton on call for nephro and she will get in touch with schedulers on Monday to get the ball rolling on referral [facility].   Diabetes mellitus type 2 -Ha1c 7.3  -SSI   Chronic diastolic CHF -Echo EF 55-60%,  technically not sufficient to eval diastolic function  -Does not appear to be an acute decompensation, BNP of 23 and does not have SOB. CXR with mild pulmonary vascular prominence and interstitial prominence  -Medications restarted except Lasix and losartan due to AKI  -I/Os, daily weight  -Lopressor   Morbid obesity -Weight is 295 with BMI of 40.1   DVT prophylaxis: subq hep Code Status: full Family Communication: wife at bedside Disposition Plan: pending improvement, home    Consultants:   None   Procedures:   none  Antimicrobials:  Anti-infectives    None        Subjective: He does not know if he has been urinating more or less, more concentrated or not, at home. He has no complaints of chest pain, SOB, cough, N/V, abdominal pain. Patient wants to go home.   Objective: Vitals:   11/18/16 1643 11/18/16 2035 11/19/16 0520 11/19/16 1045  BP: 100/65 118/60 110/60 131/69  Pulse: 73 85 77 69  Resp: 20 20 19    Temp: 98.1 F (36.7 C) 98.3 F (36.8 C) 98.3 F (36.8 C)   TempSrc: Oral Oral Oral   SpO2: 96% 94% 96% 98%  Weight:      Height:        Intake/Output Summary (Last 24 hours) at 11/19/16 1106 Last data filed at 11/19/16 0830  Gross per 24 hour  Intake  240 ml  Output             1250 ml  Net            -1010 ml   Filed Weights   11/18/16 1010  Weight: 133.8 kg (295 lb)    Examination:  General exam: Appears calm and comfortable  Respiratory system: Clear to auscultation, diminished at bases. Respiratory effort normal.  Cardiovascular system: S1 & S2 heard, RRR. No JVD, murmurs, rubs, gallops or clicks. +chronic venous changes with trace edema bilaterally  Gastrointestinal system: Abdomen is nondistended, soft and nontender. No organomegaly or masses felt. Normal bowel sounds heard. Central nervous system: Alert and oriented. No focal neurological deficits. Extremities: Symmetric  Skin: +chronic venous stasis bilateral lower  extremities Psychiatry: Judgement and insight appear normal. Mood & affect appropriate.   Data Reviewed: I have personally reviewed following labs and imaging studies  CBC:  Recent Labs Lab 11/18/16 1042 11/18/16 1339 11/19/16 0733  WBC  --  9.1 6.4  NEUTROABS  --  6.7  --   HGB 12.6* 10.8* 10.8*  HCT 37.0* 34.5* 33.4*  MCV  --  83.7 83.3  PLT  --  222 230   Basic Metabolic Panel:  Recent Labs Lab 11/14/16 0510 11/15/16 0743 11/18/16 1042 11/18/16 1339 11/19/16 0733  NA 140 138 132* 133* 137  K 4.6 5.2* 4.7 4.0 5.0  CL 97* 95* 92* 94* 103  CO2 36* 33*  --  28 23  GLUCOSE 139* 125* 151* 114* 100*  BUN 44* 49* 86* 75* 62*  CREATININE 2.00* 1.80* 3.80* 3.52* 2.62*  CALCIUM 9.3 9.4  --  8.7* 8.4*   GFR: Estimated Creatinine Clearance: 36.6 mL/min (A) (by C-G formula based on SCr of 2.62 mg/dL (H)). Liver Function Tests:  Recent Labs Lab 11/18/16 1339  AST 16  ALT 15*  ALKPHOS 44  BILITOT 0.6  PROT 6.7  ALBUMIN 3.4*   No results for input(s): LIPASE, AMYLASE in the last 168 hours. No results for input(s): AMMONIA in the last 168 hours. Coagulation Profile:  Recent Labs Lab 11/18/16 1916  INR 1.00   Cardiac Enzymes: No results for input(s): CKTOTAL, CKMB, CKMBINDEX, TROPONINI in the last 168 hours. BNP (last 3 results) No results for input(s): PROBNP in the last 8760 hours. HbA1C:  Recent Labs  11/18/16 1916  HGBA1C 7.3*   CBG:  Recent Labs Lab 11/14/16 1718 11/14/16 2107 11/15/16 0805 11/18/16 1742 11/19/16 0739  GLUCAP 142* 183* 120* 84 105*   Lipid Profile: No results for input(s): CHOL, HDL, LDLCALC, TRIG, CHOLHDL, LDLDIRECT in the last 72 hours. Thyroid Function Tests:  Recent Labs  11/18/16 1800  TSH 1.487   Anemia Panel: No results for input(s): VITAMINB12, FOLATE, FERRITIN, TIBC, IRON, RETICCTPCT in the last 72 hours. Sepsis Labs:  Recent Labs Lab 11/18/16 1044  LATICACIDVEN 0.86    No results found for this or any  previous visit (from the past 240 hour(s)).     Radiology Studies: Dg Chest Portable 1 View  Result Date: 11/18/2016 CLINICAL DATA:  Shortness of breath. EXAM: PORTABLE CHEST 1 VIEW COMPARISON:  11/13/2016 . FINDINGS: Mild cardiomegaly and mild pulmonary vascular prominence and interstitial prominence with small bilateral pleural effusions suggesting mild CHF. Similar findings on prior exam. Low lung volumes. No pneumothorax. IMPRESSION: 1. Mild CHF again cannot be excluded. Similar findings noted on prior exam. 2. Low lung volumes. Electronically Signed   By: Maisie Fus  Register   On: 11/18/2016 11:29  Scheduled Meds: . aspirin EC  81 mg Oral Daily  . chlorhexidine  15 mL Mouth Rinse BID  . donepezil  10 mg Oral Q breakfast  . heparin  5,000 Units Subcutaneous Q8H  . insulin aspart  0-9 Units Subcutaneous TID WC  . magnesium oxide  400 mg Oral Q M,W,F  . mouth rinse  15 mL Mouth Rinse q12n4p  . metoprolol tartrate  50 mg Oral BID  . polyethylene glycol  17 g Oral Daily  . terazosin  5 mg Oral Q supper   Continuous Infusions: . sodium chloride 1,000 mL (11/18/16 2226)     LOS: 0 days    Time spent: 40 minutes   Noralee Stain, DO [facility] [email or web address] Password [facility] 11/19/2016, 11:06 AM

## 2016-11-20 LAB — CBC
HEMATOCRIT: 34.4 % — AB (ref 39.0–52.0)
Hemoglobin: 10.4 g/dL — ABNORMAL LOW (ref 13.0–17.0)
MCH: 25.9 pg — ABNORMAL LOW (ref 26.0–34.0)
MCHC: 30.2 g/dL (ref 30.0–36.0)
MCV: 85.8 fL (ref 78.0–100.0)
Platelets: 241 10*3/uL (ref 150–400)
RBC: 4.01 MIL/uL — ABNORMAL LOW (ref 4.22–5.81)
RDW: 15.1 % (ref 11.5–15.5)
WBC: 7.2 10*3/uL (ref 4.0–10.5)

## 2016-11-20 LAB — BASIC METABOLIC PANEL
Anion gap: 6 (ref 5–15)
BUN: 53 mg/dL — AB (ref 6–20)
CHLORIDE: 101 mmol/L (ref 101–111)
CO2: 31 mmol/L (ref 22–32)
Calcium: 8.6 mg/dL — ABNORMAL LOW (ref 8.9–10.3)
Creatinine, Ser: 2.15 mg/dL — ABNORMAL HIGH (ref 0.61–1.24)
GFR calc Af Amer: 34 mL/min — ABNORMAL LOW (ref >60)
GFR calc non Af Amer: 29 mL/min — ABNORMAL LOW (ref >60)
GLUCOSE: 139 mg/dL — AB (ref 65–99)
POTASSIUM: 4.3 mmol/L (ref 3.5–5.1)
Sodium: 138 mmol/L (ref 135–145)

## 2016-11-20 LAB — GLUCOSE, CAPILLARY: Glucose-Capillary: 146 mg/dL — ABNORMAL HIGH (ref 65–99)

## 2016-11-20 MED ORDER — FUROSEMIDE 40 MG PO TABS: 40.0000 mg | ORAL_TABLET | Freq: Two times a day (BID) | ORAL | 0 refills | Status: DC

## 2016-11-20 NOTE — Progress Notes (Signed)
Patient discharged home.  Leaving with personal belongings and one prescription.  Patient and wife report understanding of discharge instructions.  [facility] will be resumed for PT and OT.  Room air, denies pain, no s/s of distress.  Patient accompanied by wife.  No complaints.

## 2016-11-20 NOTE — Discharge Summary (Signed)
Physician Discharge Summary  Fernando [name]:[id number] DOB: 1946-01-29 DOA: 11/18/2016  PCP: [facility]  Admit date: 11/18/2016 Discharge date: 11/20/2016  Admitted From: Home Disposition:  Home  Recommendations for Outpatient Follow-up:  1. Follow up with PCP in 1 week 2. Follow up with cardiology, Dr. Wyline Mood, in 1 week.  3. Follow up / establish with [facility]. Referral was made while in hospital. They will call with appointment.  4. Please obtain BMP in 1 week  5. Lasix dose decreased from 60mg  BID to 40mg  BID. Stop ARB until kidney function normalizes. Follow up with PCP and/or cardiology.   Discharge Condition: Stable CODE STATUS: Full  Diet recommendation: Heart healthy   Brief/Interim Summary: Fernando A Jenkinsis a 70 y.o.malewith medical history significant of diabetes, HTN, solitary kidney with CKD stage III and morbid obesity patient came into the hospital initially because he bled after bowel movement. Patient reported he was in the hospital recently discharged on the 19th after he was treated for fluid overload and acute diastolic CHF, his diuretics increased from 20 daily to 60 twice a day. He was doing well at home until he had a BM that he had to strain for. With straining, he had some blood in his stools and was brought to the hospital. In the ED, he was found to have acute kidney injury on top of his CKD with creatinine of 3.8, patient deferred for admission for further evaluation. He did at one point see a nephrologist through [place] about 6 months ago.   Patient's lasix and lisinopril were held, given cautious IVF. Kidney function continued to improve. Likely AKI secondary to overdiuresis. Dose was adjusted at discharge.   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 Continue to trend BMP. As he has been improving, I do not see urgent need for patient to be evaluated as inpatient by nephrology. I did ask Dr. Signe Colt on call for nephro and she will get in touch with schedulers on Monday to get the ball rolling on referral to [facility].  -Decrease lasix 40mg  BID and hold ARB until follow up with cardiology and repeat BMP stable   Diabetes mellitus type 2 -Ha1c 7.3  -SSI   Chronic diastolic CHF -Echo EF 55-60%, technically not sufficient to eval diastolic function  -Does not appear to be an acute decompensation, BNP of 23 and does not have SOB. CXR with mild pulmonary vascular prominence and interstitial prominence  -Medications restarted except Lasix and losartan due to AKI  -I/Os, daily weight  -Lopressor  -Volume status stable   Morbid obesity -Weight is 295 with BMI of 40.1  Discharge Instructions  Discharge Instructions    (HEART FAILURE PATIENTS) Call MD:  Anytime you have any of the following symptoms: 1) 3 pound weight gain in 24 hours or 5 pounds in 1 week 2) shortness of breath, with or without a dry hacking cough  3) swelling in the hands, feet or stomach 4) if you have to sleep on extra pillows at night in order to breathe.    Complete by:  As directed    Call MD for:  difficulty breathing, headache or visual disturbances    Complete by:  As directed    Call MD for:  extreme fatigue    Complete by:  As directed    Call MD for:  persistant dizziness or light-headedness    Complete by:  As directed    Call MD for:  persistant nausea and vomiting     Complete by:  As directed    Call MD for:  severe uncontrolled pain    Complete by:  As directed    Call MD for:  temperature >100.4    Complete by:  As directed    Diet - low sodium heart healthy    Complete by:  As directed    Discharge instructions    Complete by:  As directed    You were cared for by a hospitalist during your hospital stay. If you have any questions about your discharge medications or the care you received while you were in the hospital after you are discharged, you can call the unit and asked to speak with the hospitalist on call if the hospitalist that took care of you is not available. Once you are discharged, your primary care physician will handle any further medical issues. Please note that NO REFILLS for any discharge medications will be authorized once you are discharged, as it is imperative that you return to your primary care physician (or establish a relationship with a primary care physician if you do not have one) for your aftercare needs so that they can reassess your need for medications and monitor your lab values.   Increase activity slowly    Complete by:  As directed      Allergies as of 11/20/2016      Reactions   Ibuprofen Other (See Comments)   Due to one kidney      Medication List    STOP taking these medications   losartan 25 MG tablet Commonly known as:  COZAAR     TAKE these medications   allopurinol 100 MG tablet Commonly known as:  ZYLOPRIM Take 100 mg by mouth daily with breakfast.   ammonium lactate 12 % lotion Commonly known as:  LAC-HYDRIN Apply topically 2 (two) times daily.   aspirin EC 81 MG tablet Take 1 tablet (81 mg total) by mouth daily.   cholecalciferol 1000 units tablet Commonly known as:  VITAMIN D Take 1,000 Units by mouth daily with breakfast.   docusate sodium 50 MG capsule Commonly known as:  COLACE Take 50 mg by mouth 2 (two) times daily.   donepezil 10 MG tablet Commonly known as:  ARICEPT Take  10 mg by mouth daily with breakfast.   finasteride 5 MG tablet Commonly known as:  PROSCAR Take 5 mg by mouth daily with breakfast.   furosemide 40 MG tablet Commonly known as:  LASIX Take 1 tablet (40 mg total) by mouth 2 (two) times daily. What changed:  how much to take   glipiZIDE 5 MG tablet Commonly known as:  GLUCOTROL Take 5 mg by mouth 2 (two) times daily before a meal.   magnesium oxide 400 MG tablet Commonly known as:  MAG-OX Take 400 mg by mouth every Monday, Wednesday, and Friday.   metoprolol tartrate 50 MG tablet Commonly known  as:  LOPRESSOR Take 1 tablet (50 mg total) by mouth 2 (two) times daily.   protein supplement shake Liqd Commonly known as:  PREMIER PROTEIN Take 325 mLs (11 oz total) by mouth 2 (two) times daily between meals.   terazosin 5 MG capsule Commonly known as:  HYTRIN Take 5 mg by mouth daily with supper.   vitamin B-12 1000 MCG tablet Commonly known as:  CYANOCOBALAMIN Take 1,000 mcg by mouth daily with supper.      Follow-up Information    [facility]. Schedule an appointment as soon as possible for a visit.   Specialty:  General Practice Contact information: [address] [place] [address] [phone]        Antoine Poche, MD. Schedule an appointment as soon as possible for a visit in 1 week(s).   Specialty:  Cardiology Why:  Will need repeat BMP and further adjustments of lasix, lisinopril  Contact information: [address] [place] [address] [phone]        [facility] Follow up.   Specialty:  Nephrology Why:  Referral made. They will call you for appointment.  Contact information: [address] [place] [address] [phone]          Allergies  Allergen Reactions  . Ibuprofen Other (See Comments)    Due to one kidney    Consultations:  None   Procedures/Studies: Nm Pulmonary Perf And Vent  Result Date:  11/09/2016 CLINICAL DATA:  71 year old male with shortness of breath. EXAM: NUCLEAR MEDICINE VENTILATION - PERFUSION LUNG SCAN TECHNIQUE: Ventilation images were obtained in multiple projections using inhaled aerosol Tc-34m DTPA. Perfusion images were obtained in multiple projections after intravenous injection of Tc-31m MAA. RADIOPHARMACEUTICALS:  31.9 mCi Technetium-75m DTPA aerosol inhalation and 4.4 mCi Technetium-61m MAA IV COMPARISON:  11/09/2016 chest radiograph FINDINGS: Ventilation: Decreased ventilation within the left lower lung noted. Perfusion: Decreased perfusion within the left lower lung noted. No other significant perfusion abnormalities noted. IMPRESSION: Low probability for pulmonary embolus (10-19%). Moderate to large matching ventilation/perfusion defect within the left lower lobe. In correlation with recent chest radiograph, this probably represents left lower lung consolidation/airspace disease. Electronically Signed   By: Harmon Pier M.D.   On: 11/09/2016 18:30   Dg Chest Portable 1 View  Result Date: 11/18/2016 CLINICAL DATA:  Shortness of breath. EXAM: PORTABLE CHEST 1 VIEW COMPARISON:  11/13/2016 . FINDINGS: Mild cardiomegaly and mild pulmonary vascular prominence and interstitial prominence with small bilateral pleural effusions suggesting mild CHF. Similar findings on prior exam. Low lung volumes. No pneumothorax. IMPRESSION: 1. Mild CHF again cannot be excluded. Similar findings noted on prior exam. 2. Low lung volumes. Electronically Signed   By: Maisie Fus  Register   On: 11/18/2016 11:29   Dg Chest Port 1 View  Result Date: 11/13/2016 CLINICAL DATA:  CHF EXAM: PORTABLE CHEST 1 VIEW COMPARISON:  11/10/2016 FINDINGS: Bibasilar airspace disease left greater than right unchanged. Mild vascular congestion without edema or effusion. Cardiac enlargement. IMPRESSION: Cardiac enlargement with vascular congestion unchanged Bibasilar atelectasis/ infiltrate unchanged Electronically Signed    By: Marlan Palau M.D.   On: 11/13/2016 15:10   Dg Chest Port 1 View  Result Date: 11/10/2016 CLINICAL DATA:  Short of breath EXAM: PORTABLE CHEST 1 VIEW COMPARISON:  11/09/2016 FINDINGS: Pulmonary vascular congestion unchanged.  Negative for edema. Bibasilar airspace disease similar to the prior study, left greater than right negative for effusion IMPRESSION: Pulmonary vascular congestion unchanged Bibasilar airspace disease  left greater than right unchanged Electronically Signed   By: Marlan Palauharles  Clark M.D.   On: 11/10/2016 12:33   Dg Chest Port 1 View  Result Date: 11/09/2016 CLINICAL DATA:  Shortness of breath EXAM: PORTABLE CHEST 1 VIEW COMPARISON:  01/29/2016 FINDINGS: Cardiomegaly and pulmonary vascular congestion. No air bronchogram or Kerley lines. No effusion or pneumothorax. Low lung volumes. IMPRESSION: Low volume chest with cardiomegaly and pulmonary vascular congestion. Electronically Signed   By: Marnee SpringJonathon  Watts M.D.   On: 11/09/2016 09:19       Discharge Exam: Vitals:   11/19/16 2135 11/20/16 0556  BP: (!) 108/58 118/68  Pulse: 67 84  Resp: 16 18  Temp: 99.2 F (37.3 C) 98.3 F (36.8 C)   Vitals:   11/19/16 1405 11/19/16 1515 11/19/16 2135 11/20/16 0556  BP:  124/75 (!) 108/58 118/68  Pulse:  60 67 84  Resp:  18 16 18   Temp:  98.3 F (36.8 C) 99.2 F (37.3 C) 98.3 F (36.8 C)  TempSrc:  Oral Oral Oral  SpO2: 95% 96% 92% 93%  Weight:      Height:        General exam: Appears calm and comfortable  Respiratory system: Clear to auscultation, diminished at bases. Respiratory effort normal.  Cardiovascular system: S1 & S2 heard, RRR. No JVD, murmurs, rubs, gallops or clicks. +chronic venous changes with trace edema bilaterally  Gastrointestinal system: Abdomen is nondistended, soft and nontender. No organomegaly or masses felt. Normal bowel sounds heard. Central nervous system: Alert and oriented. No focal neurological deficits. Extremities: Symmetric  Skin:  +chronic venous stasis bilateral lower extremities with edema, but not worse edema than usual  Psychiatry: Judgement and insight appear normal. Mood & affect appropriate.    The results of significant diagnostics from this hospitalization (including imaging, microbiology, ancillary and laboratory) are listed below for reference.     Microbiology: No results found for this or any previous visit (from the past 240 hour(s)).   Labs: BNP (last 3 results)  Recent Labs  01/30/16 0157 11/09/16 0859 11/18/16 1340  BNP 130.4* 93.3 23.3   Basic Metabolic Panel:  Recent Labs Lab 11/14/16 0510 11/15/16 0743 11/18/16 1042 11/18/16 1339 11/19/16 0733 11/20/16 0728  NA 140 138 132* 133* 137 138  K 4.6 5.2* 4.7 4.0 5.0 4.3  CL 97* 95* 92* 94* 103 101  CO2 36* 33*  --  28 23 31   GLUCOSE 139* 125* 151* 114* 100* 139*  BUN 44* 49* 86* 75* 62* 53*  CREATININE 2.00* 1.80* 3.80* 3.52* 2.62* 2.15*  CALCIUM 9.3 9.4  --  8.7* 8.4* 8.6*   Liver Function Tests:  Recent Labs Lab 11/18/16 1339  AST 16  ALT 15*  ALKPHOS 44  BILITOT 0.6  PROT 6.7  ALBUMIN 3.4*   No results for input(s): LIPASE, AMYLASE in the last 168 hours. No results for input(s): AMMONIA in the last 168 hours. CBC:  Recent Labs Lab 11/18/16 1042 11/18/16 1339 11/19/16 0733 11/20/16 0728  WBC  --  9.1 6.4 7.2  NEUTROABS  --  6.7  --   --   HGB 12.6* 10.8* 10.8* 10.4*  HCT 37.0* 34.5* 33.4* 34.4*  MCV  --  83.7 83.3 85.8  PLT  --  222 230 241   Cardiac Enzymes: No results for input(s): CKTOTAL, CKMB, CKMBINDEX, TROPONINI in the last 168 hours. BNP: Invalid input(s): POCBNP CBG:  Recent Labs Lab 11/18/16 1742 11/19/16 0739 11/19/16 1122 11/19/16 1707 11/20/16 0755  GLUCAP  84 105* 127* 109* 146*   D-Dimer No results for input(s): DDIMER in the last 72 hours. Hgb A1c  Recent Labs  11/18/16 1916  HGBA1C 7.3*   Lipid Profile No results for input(s): CHOL, HDL, LDLCALC, TRIG, CHOLHDL,  LDLDIRECT in the last 72 hours. Thyroid function studies  Recent Labs  11/18/16 1800  TSH 1.487   Anemia work up No results for input(s): VITAMINB12, FOLATE, FERRITIN, TIBC, IRON, RETICCTPCT in the last 72 hours. Urinalysis    Component Value Date/Time   COLORURINE STRAW (A) 11/18/2016 1340   APPEARANCEUR CLEAR 11/18/2016 1340   LABSPEC 1.005 11/18/2016 1340   PHURINE 6.0 11/18/2016 1340   GLUCOSEU NEGATIVE 11/18/2016 1340   HGBUR NEGATIVE 11/18/2016 1340   BILIRUBINUR NEGATIVE 11/18/2016 1340   KETONESUR NEGATIVE 11/18/2016 1340   PROTEINUR NEGATIVE 11/18/2016 1340   UROBILINOGEN 0.2 07/15/2009 2249   NITRITE NEGATIVE 11/18/2016 1340   LEUKOCYTESUR NEGATIVE 11/18/2016 1340   Sepsis Labs Invalid input(s): PROCALCITONIN,  WBC,  LACTICIDVEN Microbiology No results found for this or any previous visit (from the past 240 hour(s)).   Time coordinating discharge: 40 minutes  SIGNED:  Noralee Stain, DO [facility] Pager [phone]  If 7PM-7AM, please contact night-coverage [email or web address] Password [facility] 11/20/2016, 9:53 AM

## 2016-11-20 NOTE — Care Management Note (Signed)
Case Management Note  Patient Details  Name: Fernando [name] MRN: [id number] Date of Birth: [birth year]  Subjective/Objective:      Acute CHF, SVT, HTN               Action/Plan: Discharge Planning: NCM spoke to pt's wife. [facility]. Pt is a new CHF and readmission. [facility]. [facility]. Contacted [facility] rep with updated information and resumption of care. Pt has RW at home.   Physical Address [address] [place], [place] [address]   [facility]   Expected Discharge Date:  11/20/16               Expected Discharge Plan:  Home w Home Health Services  In-House Referral:  NA  Discharge planning Services  CM Consult  Post Acute Care Choice:  Home Health Choice offered to:  Spouse  DME Arranged:  N/A DME Agency:  NA  HH Arranged:  PT, OT, RN HH Agency:  [facility]  Status of Service:  Completed, signed off  If discussed at Long Length of Stay Meetings, dates discussed:    Additional Comments:  Elliot CousinShavis, Ambria Mayfield Ellen, RN 11/20/2016, 10:38 AM

## 2016-12-06 ENCOUNTER — Ambulatory Visit (INDEPENDENT_AMBULATORY_CARE_PROVIDER_SITE_OTHER): Payer: Medicare Other | Admitting: Cardiology

## 2016-12-06 ENCOUNTER — Encounter: Payer: Self-pay | Admitting: Cardiology

## 2016-12-06 ENCOUNTER — Other Ambulatory Visit (HOSPITAL_COMMUNITY)
Admission: RE | Admit: 2016-12-06 | Discharge: 2016-12-06 | Disposition: A | Discharge status: Home / Self Care | Payer: Medicare Other | Source: Ambulatory Visit | Attending: Cardiology | Admitting: Cardiology

## 2016-12-06 VITALS — BP 144/78 | HR 65 | Ht 72.0 in | Wt 305.0 lb

## 2016-12-06 DIAGNOSIS — N189 Chronic kidney disease, unspecified: Secondary | ICD-10-CM | POA: Diagnosis not present

## 2016-12-06 DIAGNOSIS — G4730 Sleep apnea, unspecified: Secondary | ICD-10-CM | POA: Diagnosis not present

## 2016-12-06 DIAGNOSIS — Z79899 Other long term (current) drug therapy: Secondary | ICD-10-CM | POA: Insufficient documentation

## 2016-12-06 DIAGNOSIS — I5033 Acute on chronic diastolic (congestive) heart failure: Secondary | ICD-10-CM

## 2016-12-06 LAB — BASIC METABOLIC PANEL
Anion gap: 9 (ref 5–15)
BUN: 32 mg/dL — AB (ref 6–20)
CO2: 36 mmol/L — ABNORMAL HIGH (ref 22–32)
CREATININE: 1.76 mg/dL — AB (ref 0.61–1.24)
Calcium: 9.6 mg/dL (ref 8.9–10.3)
Chloride: 95 mmol/L — ABNORMAL LOW (ref 101–111)
GFR calc Af Amer: 43 mL/min — ABNORMAL LOW (ref >60)
GFR, EST NON AFRICAN AMERICAN: 37 mL/min — AB (ref >60)
GLUCOSE: 122 mg/dL — AB (ref 65–99)
Potassium: 3.8 mmol/L (ref 3.5–5.1)
SODIUM: 140 mmol/L (ref 135–145)

## 2016-12-06 LAB — MAGNESIUM: MAGNESIUM: 1.9 mg/dL (ref 1.7–2.4)

## 2016-12-06 MED ORDER — FUROSEMIDE 40 MG PO TABS: 40.0000 mg | ORAL_TABLET | Freq: Two times a day (BID) | ORAL | 3 refills | Status: DC

## 2016-12-06 NOTE — Progress Notes (Signed)
Clinical Summary Mr. Fernando [name] is a 71 y.o.male seen today for follow up of the following medical problems.   1. Chronic diastolic HF - admission 10/2016 with volume overload - echo LVEF 55-60%, cannot eval diastolic dusfunction, mod LAE suggests dysfunction - recent exacerbation possibly exacerbated by episodes of PSVT - discharge weight 295 lbs. He was discharged on lasix 60mg  bid. Dose decreased to 40mg  bid after follow up admission with AKi.   - home weights: 296-->302 lbs. Has had some LE edema. No recent SOB   2. CKD - admit later June with AKI, Cr up to 3.8, baseline Cr is 1.8. Had been on lasix 60mg  bid. Cr improved with IVFs - he was to see renal as outpatient - lasix lowered to 40mg  bid on discharge   3. PSVT - on beta blocker - denies any recent palpitations  4. OSA? - sleep study? Past Medical History:  Diagnosis Date  . Arthritis   . Diabetes mellitus without complication (HCC)   . Hypertension   . Renal disorder      Allergies  Allergen Reactions  . Ibuprofen Other (See Comments)    Due to one kidney     Current Outpatient Prescriptions  Medication Sig Dispense Refill  . allopurinol (ZYLOPRIM) 100 MG tablet Take 100 mg by mouth daily with breakfast.    . ammonium lactate (LAC-HYDRIN) 12 % lotion Apply topically 2 (two) times daily. 400 g 0  . aspirin EC 81 MG tablet Take 1 tablet (81 mg total) by mouth daily.    . cholecalciferol (VITAMIN D) 1000 units tablet Take 1,000 Units by mouth daily with breakfast.     . docusate sodium (COLACE) 50 MG capsule Take 50 mg by mouth 2 (two) times daily.    Marland Kitchen. donepezil (ARICEPT) 10 MG tablet Take 10 mg by mouth daily with breakfast.     . finasteride (PROSCAR) 5 MG tablet Take 5 mg by mouth daily with breakfast.     . furosemide (LASIX) 40 MG tablet Take 1 tablet (40 mg total) by mouth 2 (two) times daily. 90 tablet 0  . glipiZIDE (GLUCOTROL) 5 MG tablet Take 5 mg by mouth 2 (two) times daily before a meal.      . magnesium oxide (MAG-OX) 400 MG tablet Take 400 mg by mouth every Monday, Wednesday, and Friday.     . metoprolol tartrate (LOPRESSOR) 50 MG tablet Take 1 tablet (50 mg total) by mouth 2 (two) times daily. 60 tablet 0  . protein supplement shake (PREMIER PROTEIN) LIQD Take 325 mLs (11 oz total) by mouth 2 (two) times daily between meals.  0  . terazosin (HYTRIN) 5 MG capsule Take 5 mg by mouth daily with supper.     . vitamin B-12 (CYANOCOBALAMIN) 1000 MCG tablet Take 1,000 mcg by mouth daily with supper.      No current facility-administered medications for this visit.      Past Surgical History:  Procedure Laterality Date  . BACK SURGERY    . NEPHRECTOMY Left 1967     Allergies  Allergen Reactions  . Ibuprofen Other (See Comments)    Due to one kidney      Family History  Problem Relation Age of Onset  . Heart disease Mother      Social History Mr. Fernando [name] reports that he quit smoking about 46 years ago. He has never used smokeless tobacco. Mr. Fernando [name] reports that he does not drink alcohol.   Review of Systems  CONSTITUTIONAL: No weight loss, fever, chills, weakness or fatigue.  HEENT: Eyes: No visual loss, blurred vision, double vision or yellow sclerae.No hearing loss, sneezing, congestion, runny nose or sore throat.  SKIN: No rash or itching.  CARDIOVASCULAR: per hpi RESPIRATORY: No shortness of breath, cough or sputum.  GASTROINTESTINAL: No anorexia, nausea, vomiting or diarrhea. No abdominal pain or blood.  GENITOURINARY: No burning on urination, no polyuria NEUROLOGICAL: No headache, dizziness, syncope, paralysis, ataxia, numbness or tingling in the extremities. No change in bowel or bladder control.  MUSCULOSKELETAL: No muscle, back pain, joint pain or stiffness.  LYMPHATICS: No enlarged nodes. No history of splenectomy.  PSYCHIATRIC: No history of depression or anxiety.  ENDOCRINOLOGIC: No reports of sweating, cold or heat intolerance. No polyuria or  polydipsia.  Marland Kitchen   Physical Examination Vitals:   12/06/16 1250  BP: (!) 144/78  Pulse: 65   Vitals:   12/06/16 1250  Weight: (!) 305 lb (138.3 kg)  Height: 6' (1.829 m)    Gen: resting comfortably, no acute distress HEENT: no scleral icterus, pupils equal round and reactive, no palptable cervical adenopathy,  CV: RRR, no m/r/g, no jvd Resp: Clear to auscultation bilaterally GI: abdomen is soft, non-tender, non-distended, normal bowel sounds, no hepatosplenomegaly MSK: extremities are warm, 1+ bilateral LE edema  Skin: warm, no rash Neuro:  no focal deficits Psych: appropriate affect    Assessment and Plan  1. Acute on chronic diastolic HF - increasing edema and weight - increase lasix to 60mg  in AM and 40mg  in PM - repeat BMET/Mg  2. CKD - we will refer to nephrology  3. OSA screen - signs and symptoms of OSA, refer for sleep apnea evaluation  4. PSVT - no symptoms - conitnue beta blockers  Antoine Poche, M.D.

## 2016-12-06 NOTE — Patient Instructions (Signed)
Medication Instructions:  Increase lasix to 60 mg in the morning, 40 mg in the evening  Labwork:TODAY BMET MAGNESIUM   Testing/Procedures: NONE  Follow-Up: Your physician recommends that you schedule a follow-up appointment in: 2 WEEKS    Any Other Special Instructions Will Be Listed Below (If Applicable).  You have been referred to PULMONARY & NEPHROLOGY     If you need a refill on your cardiac medications before your next appointment, please call your pharmacy.

## 2016-12-07 ENCOUNTER — Telehealth: Payer: Self-pay | Admitting: *Deleted

## 2016-12-07 DIAGNOSIS — I10 Essential (primary) hypertension: Secondary | ICD-10-CM

## 2016-12-07 NOTE — Telephone Encounter (Signed)
-----   Message from Antoine PocheJonathan F Branch, MD sent at 12/07/2016 10:40 AM EDT ----- Labs show kidney function is back to his baseline, which is mildly decreased. Much better than when he was recently in the hospital. Id like to repeat another BMET/Mg in 2 weeks   J BrancH MD

## 2016-12-07 NOTE — Telephone Encounter (Signed)
Pt.notified

## 2016-12-20 ENCOUNTER — Other Ambulatory Visit (HOSPITAL_COMMUNITY)
Admission: RE | Admit: 2016-12-20 | Discharge: 2016-12-20 | Disposition: A | Discharge status: Home / Self Care | Payer: Medicare Other | Source: Ambulatory Visit | Attending: Cardiology | Admitting: Cardiology

## 2016-12-20 ENCOUNTER — Ambulatory Visit (INDEPENDENT_AMBULATORY_CARE_PROVIDER_SITE_OTHER): Payer: Medicare Other | Admitting: Adult Health

## 2016-12-20 ENCOUNTER — Encounter: Payer: Self-pay | Admitting: Adult Health

## 2016-12-20 VITALS — BP 134/76 | HR 73 | Ht 72.0 in | Wt 313.0 lb

## 2016-12-20 DIAGNOSIS — I5031 Acute diastolic (congestive) heart failure: Secondary | ICD-10-CM | POA: Diagnosis not present

## 2016-12-20 DIAGNOSIS — N189 Chronic kidney disease, unspecified: Secondary | ICD-10-CM

## 2016-12-20 DIAGNOSIS — I10 Essential (primary) hypertension: Secondary | ICD-10-CM | POA: Insufficient documentation

## 2016-12-20 LAB — BASIC METABOLIC PANEL
Anion gap: 6 (ref 5–15)
BUN: 23 mg/dL — AB (ref 6–20)
CO2: 39 mmol/L — ABNORMAL HIGH (ref 22–32)
CREATININE: 2.2 mg/dL — AB (ref 0.61–1.24)
Calcium: 9 mg/dL (ref 8.9–10.3)
Chloride: 96 mmol/L — ABNORMAL LOW (ref 101–111)
GFR calc Af Amer: 33 mL/min — ABNORMAL LOW (ref >60)
GFR, EST NON AFRICAN AMERICAN: 28 mL/min — AB (ref >60)
GLUCOSE: 101 mg/dL — AB (ref 65–99)
POTASSIUM: 4.1 mmol/L (ref 3.5–5.1)
Sodium: 141 mmol/L (ref 135–145)

## 2016-12-20 MED ORDER — TORSEMIDE 20 MG PO TABS: ORAL_TABLET | ORAL | 3 refills | Status: DC

## 2016-12-20 NOTE — Progress Notes (Signed)
Cardiology Office Note   Date:  12/20/2016   ID:  Fernando [name] [birth year], MRN [id number]  PCP:  [facility]  Cardiologist:  Wyline Mood Chief Complaint  Patient presents with  . Congestive Heart Failure  . Hypertension      History of Present Illness: Fernando [name] is a 71 y.o. male who presents for ongoing assessment and management of chronic diastolic heart failure, history of PSVT, along with chronic kidney disease (baseline creatinine 1.8), and possible sleep apnea. The patient was last seen in the office on 12/06/2016 by Dr. Wyline Mood. At that time the patient was found to have increasing edema and weight, Lasix was increased to 60 mg in the a.m. and 40 mg in the p.m. A repeat BMET and magnesium was ordered. The patient was referred to nephrology for chronic kidney disease. Patient was also referred for sleep study.  Follow-up labs on 12/06/2016, sodium 140, potassium 3.8, chloride 95, CO2 36, BUN 32, creatinine 1.76. Magnesium 1.9  [name] has gained 7 pounds since last office visit. The patient is not adhered to a low-sodium diet. HEENT his wife eat out daily she does include fast food and [place]. He states that he does not feel much better with increased dose of Lasix. His wife states that she has not noticed any increased swelling or dyspnea. Patient also has been eating a lot of watermelon and drinking water. States she is thirsty.  Past Medical History:  Diagnosis Date  . Arthritis   . Diabetes mellitus without complication (HCC)   . Hypertension   . Renal disorder     Past Surgical History:  Procedure Laterality Date  . BACK SURGERY    . NEPHRECTOMY Left 1967     Current Outpatient Prescriptions  Medication Sig Dispense Refill  . allopurinol (ZYLOPRIM) 100 MG tablet Take 100 mg by mouth daily with breakfast.    . aspirin EC 81 MG tablet Take 1 tablet (81 mg total) by mouth daily.    . cholecalciferol (VITAMIN D) 1000 units tablet Take 1,000  Units by mouth daily with breakfast.     . docusate sodium (COLACE) 50 MG capsule Take 50 mg by mouth 2 (two) times daily.    Marland Kitchen donepezil (ARICEPT) 10 MG tablet Take 10 mg by mouth daily with breakfast.     . finasteride (PROSCAR) 5 MG tablet Take 5 mg by mouth daily with breakfast.     . glipiZIDE (GLUCOTROL) 5 MG tablet Take 5 mg by mouth 2 (two) times daily before a meal.    . magnesium oxide (MAG-OX) 400 MG tablet Take 400 mg by mouth every Monday, Wednesday, and Friday.     . metoprolol tartrate (LOPRESSOR) 50 MG tablet Take 1 tablet (50 mg total) by mouth 2 (two) times daily. 60 tablet 0  . protein supplement shake (PREMIER PROTEIN) LIQD Take 325 mLs (11 oz total) by mouth 2 (two) times daily between meals. (Patient taking differently: Take 11 oz by mouth daily. )  0  . terazosin (HYTRIN) 5 MG capsule Take 5 mg by mouth daily with supper.     . vitamin B-12 (CYANOCOBALAMIN) 1000 MCG tablet Take 1,000 mcg by mouth daily with supper.     . torsemide (DEMADEX) 20 MG tablet Take 40 mg (2 Tablet in the AM ) and 20 mg ( 1 Tablet in the PM) 180 tablet 3   No current facility-administered medications for this visit.     Allergies:   Ibuprofen  Social History:  The patient  reports that he quit smoking about 46 years ago. He has never used smokeless tobacco. He reports that he does not drink alcohol or use drugs.   Family History:  The patient's family history includes Heart disease in his mother.    ROS: All other systems are reviewed and negative. Unless otherwise mentioned in H&P    PHYSICAL EXAM: VS:  BP 134/76   Pulse 73   Ht 6' (1.829 m)   Wt (!) 313 lb (142 kg)   SpO2 92%   BMI 42.45 kg/m  , BMI Body mass index is 42.45 kg/m. GEN: Well nourished, well developed, in no acute distress Obese HEENT: normal  Neck: no JVD, carotid bruits, or masses Cardiac: RRR; no murmurs, rubs, or gallops Respiratory:  lear to auscultation bilaterally, normal work of breathing GI: soft,  nontender,distended, + BS MS: no deformity or atrophy legs are wrapped in Ace bandage, mild edema at the knees, Skin: warm and dry, no rash Neuro:  Strength and sensation are intact Psych: euthymic mood, full affect   Recent Labs: 11/18/2016: ALT 15; B Natriuretic Peptide 23.3; TSH 1.487 11/20/2016: Hemoglobin 10.4; Platelets 241 12/06/2016: Magnesium 1.9 12/20/2016: BUN 23; Creatinine, Ser 2.20; Potassium 4.1; Sodium 141    Lipid Panel    Component Value Date/Time   CHOL  07/16/2009 0415    160        ATP III CLASSIFICATION:  <200     mg/dL   Desirable  161-096  mg/dL   Borderline High  >=045    mg/dL   High          TRIG 409 07/16/2009 0415   HDL 29 (L) 07/16/2009 0415   CHOLHDL 5.5 07/16/2009 0415   VLDL 28 07/16/2009 0415   LDLCALC (H) 07/16/2009 0415    103        Total Cholesterol/HDL:CHD Risk Coronary Heart Disease Risk Table                     Men   Women  1/2 Average Risk   3.4   3.3  Average Risk       5.0   4.4  2 X Average Risk   9.6   7.1  3 X Average Risk  23.4   11.0        Use the calculated Patient Ratio above and the CHD Risk Table to determine the patient's CHD Risk.        ATP III CLASSIFICATION (LDL):  <100     mg/dL   Optimal  811-914  mg/dL   Near or Above                    Optimal  130-159  mg/dL   Borderline  782-956  mg/dL   High  >213     mg/dL   Very High      Wt Readings from Last 3 Encounters:  12/20/16 (!) 313 lb (142 kg)  12/06/16 (!) 305 lb (138.3 kg)  11/18/16 295 lb (133.8 kg)      Other studies Reviewed: Echocardiogram 2016-12-09 Left ventricle: The cavity size was normal. Wall thickness was   increased in a pattern of moderate LVH. Systolic function was   normal. The estimated ejection fraction was in the range of 55%   to 60%. The study is not technically sufficient to allow   evaluation of LV diastolic function. - Mitral valve: Calcified annulus. Mildly thickened  leaflets . - Left atrium: The atrium was moderately  dilated. - Right ventricle: Not well seen on subcostal appears mildly   dilated on apical views.  ASSESSMENT AND PLAN:  1. Chronic diastolic heart failure: The patient has gained 7 pounds since last office visit, he is not adhere to low-sodium diet, and is drinking good bit of liquid. With renal insufficiency a do not want to give any metolazone at this time. I'm going to change his Lasix to torsemide 40 mg twice a day. He is going to take it on an empty stomach weight approximately 45 minutes before eating. The patient has been reinforced on a low-sodium diet which includes avoiding fast foods especially [place]. He needs to weigh himself daily if he continues to gain weight or have abdominal distention that is worsening we'll probably have to admit him for IV diuresis. I've explained this to him and his wife. We'll repeat BMET in one week.   2. Chronic kidney disease: (Baseline creatinine 1.8). Follow-up BMET after changing to torsemide. It may be necessary to admit this patient for IV diuresis if he does not begin to have relief of abdominal distention and lose weight. It is likely that he will have to be admitted but will try above medication changes and dietary restrictions.    Current medicines are reviewed at length with the patient today.    Labs/ tests ordered today include: BMET  Bettey MareKathryn M. Liborio NixonLawrence DNP, ANP, AACC   12/20/2016 4:07 PM    [facility] [address], [place], [place] [address] Phone: [phone]; Fax: [phone]

## 2016-12-20 NOTE — Patient Instructions (Signed)
Medication Instructions:  STOP Taking Lasix   Start Taking Torsemide 40 mg in the morning and 20 mg in the evening   Labwork: BMET Just before your next visit   Testing/Procedures: None   Follow-Up: Your physician recommends that you schedule a follow-up appointment in: 1 Month    Any Other Special Instructions Will Be Listed Below (If Applicable).     If you need a refill on your cardiac medications before your next appointment, please call your pharmacy.

## 2016-12-26 DIAGNOSIS — R809 Proteinuria, unspecified: Secondary | ICD-10-CM | POA: Diagnosis not present

## 2016-12-26 DIAGNOSIS — I10 Essential (primary) hypertension: Secondary | ICD-10-CM | POA: Diagnosis not present

## 2016-12-26 DIAGNOSIS — E1129 Type 2 diabetes mellitus with other diabetic kidney complication: Secondary | ICD-10-CM | POA: Diagnosis not present

## 2016-12-26 DIAGNOSIS — N183 Chronic kidney disease, stage 3 (moderate): Secondary | ICD-10-CM | POA: Diagnosis not present

## 2016-12-27 DIAGNOSIS — I5032 Chronic diastolic (congestive) heart failure: Secondary | ICD-10-CM | POA: Diagnosis not present

## 2016-12-27 DIAGNOSIS — G4733 Obstructive sleep apnea (adult) (pediatric): Secondary | ICD-10-CM | POA: Diagnosis not present

## 2016-12-27 DIAGNOSIS — I10 Essential (primary) hypertension: Secondary | ICD-10-CM | POA: Diagnosis not present

## 2016-12-29 ENCOUNTER — Telehealth: Payer: Self-pay

## 2016-12-29 NOTE — Telephone Encounter (Signed)
Called pt several times, phone has a fast/busy signal. I will try again later.

## 2016-12-29 NOTE — Telephone Encounter (Signed)
-----   Message from Antoine PocheJonathan F Branch, MD sent at 12/29/2016 12:30 PM EDT ----- Labs show some increased stress on the kidneys, but not to the extent he had about 1 month ago. How is his swelling and weights doing? Has he been to see nephrology yet? Depending on his weights and swelling may try coming down on the lasix a bit   Dominga FerryJ Branch MD

## 2017-01-06 ENCOUNTER — Other Ambulatory Visit (HOSPITAL_COMMUNITY): Payer: Self-pay | Admitting: Nephrology

## 2017-01-06 DIAGNOSIS — N183 Chronic kidney disease, stage 3 unspecified: Secondary | ICD-10-CM

## 2017-01-16 ENCOUNTER — Other Ambulatory Visit (HOSPITAL_COMMUNITY)
Admission: RE | Admit: 2017-01-16 | Discharge: 2017-01-16 | Disposition: A | Discharge status: Home / Self Care | Payer: Medicare Other | Source: Ambulatory Visit | Attending: Adult Health | Admitting: Adult Health

## 2017-01-16 DIAGNOSIS — I5031 Acute diastolic (congestive) heart failure: Secondary | ICD-10-CM | POA: Diagnosis present

## 2017-01-16 LAB — BASIC METABOLIC PANEL
Anion gap: 9 (ref 5–15)
BUN: 29 mg/dL — AB (ref 6–20)
CALCIUM: 8.6 mg/dL — AB (ref 8.9–10.3)
CHLORIDE: 97 mmol/L — AB (ref 101–111)
CO2: 35 mmol/L — ABNORMAL HIGH (ref 22–32)
CREATININE: 2.08 mg/dL — AB (ref 0.61–1.24)
GFR, EST AFRICAN AMERICAN: 35 mL/min — AB (ref >60)
GFR, EST NON AFRICAN AMERICAN: 30 mL/min — AB (ref >60)
Glucose, Bld: 93 mg/dL (ref 65–99)
Potassium: 3.7 mmol/L (ref 3.5–5.1)
SODIUM: 141 mmol/L (ref 135–145)

## 2017-01-19 ENCOUNTER — Ambulatory Visit (HOSPITAL_COMMUNITY)
Admission: RE | Admit: 2017-01-19 | Discharge: 2017-01-19 | Disposition: A | Discharge status: Home / Self Care | Payer: Medicare Other | Source: Ambulatory Visit | Attending: Nephrology | Admitting: Nephrology

## 2017-01-20 ENCOUNTER — Ambulatory Visit (INDEPENDENT_AMBULATORY_CARE_PROVIDER_SITE_OTHER): Payer: Medicare Other | Admitting: Adult Health

## 2017-01-20 ENCOUNTER — Encounter: Payer: Self-pay | Admitting: Adult Health

## 2017-01-20 ENCOUNTER — Inpatient Hospital Stay (HOSPITAL_COMMUNITY)
Admission: EM | Admit: 2017-01-20 | Discharge: 2017-01-26 | DRG: 291 | Disposition: A | Discharge status: Home / Self Care | Payer: Medicare Other | Attending: Internal Medicine | Admitting: Internal Medicine

## 2017-01-20 ENCOUNTER — Encounter (HOSPITAL_COMMUNITY): Payer: Self-pay | Admitting: Emergency Medicine

## 2017-01-20 ENCOUNTER — Emergency Department (HOSPITAL_COMMUNITY): Payer: Medicare Other

## 2017-01-20 VITALS — BP 130/80 | HR 72 | Ht 72.0 in | Wt 320.6 lb

## 2017-01-20 DIAGNOSIS — M1990 Unspecified osteoarthritis, unspecified site: Secondary | ICD-10-CM | POA: Diagnosis not present

## 2017-01-20 DIAGNOSIS — N189 Chronic kidney disease, unspecified: Secondary | ICD-10-CM

## 2017-01-20 DIAGNOSIS — F0390 Unspecified dementia without behavioral disturbance: Secondary | ICD-10-CM | POA: Diagnosis not present

## 2017-01-20 DIAGNOSIS — I130 Hypertensive heart and chronic kidney disease with heart failure and stage 1 through stage 4 chronic kidney disease, or unspecified chronic kidney disease: Principal | ICD-10-CM | POA: Diagnosis present

## 2017-01-20 DIAGNOSIS — J9811 Atelectasis: Secondary | ICD-10-CM | POA: Diagnosis not present

## 2017-01-20 DIAGNOSIS — R7989 Other specified abnormal findings of blood chemistry: Secondary | ICD-10-CM | POA: Diagnosis present

## 2017-01-20 DIAGNOSIS — E1122 Type 2 diabetes mellitus with diabetic chronic kidney disease: Secondary | ICD-10-CM | POA: Diagnosis not present

## 2017-01-20 DIAGNOSIS — Z79899 Other long term (current) drug therapy: Secondary | ICD-10-CM | POA: Diagnosis not present

## 2017-01-20 DIAGNOSIS — Z7982 Long term (current) use of aspirin: Secondary | ICD-10-CM | POA: Diagnosis not present

## 2017-01-20 DIAGNOSIS — Z905 Acquired absence of kidney: Secondary | ICD-10-CM

## 2017-01-20 DIAGNOSIS — R32 Unspecified urinary incontinence: Secondary | ICD-10-CM | POA: Diagnosis not present

## 2017-01-20 DIAGNOSIS — Z87891 Personal history of nicotine dependence: Secondary | ICD-10-CM | POA: Diagnosis not present

## 2017-01-20 DIAGNOSIS — I5041 Acute combined systolic (congestive) and diastolic (congestive) heart failure: Secondary | ICD-10-CM

## 2017-01-20 DIAGNOSIS — J9601 Acute respiratory failure with hypoxia: Secondary | ICD-10-CM | POA: Diagnosis not present

## 2017-01-20 DIAGNOSIS — I5032 Chronic diastolic (congestive) heart failure: Secondary | ICD-10-CM

## 2017-01-20 DIAGNOSIS — J9691 Respiratory failure, unspecified with hypoxia: Secondary | ICD-10-CM | POA: Diagnosis not present

## 2017-01-20 DIAGNOSIS — I110 Hypertensive heart disease with heart failure: Secondary | ICD-10-CM | POA: Diagnosis not present

## 2017-01-20 DIAGNOSIS — R748 Abnormal levels of other serum enzymes: Secondary | ICD-10-CM | POA: Diagnosis not present

## 2017-01-20 DIAGNOSIS — I5043 Acute on chronic combined systolic (congestive) and diastolic (congestive) heart failure: Secondary | ICD-10-CM | POA: Diagnosis not present

## 2017-01-20 DIAGNOSIS — R778 Other specified abnormalities of plasma proteins: Secondary | ICD-10-CM | POA: Diagnosis present

## 2017-01-20 DIAGNOSIS — F05 Delirium due to known physiological condition: Secondary | ICD-10-CM | POA: Diagnosis not present

## 2017-01-20 DIAGNOSIS — N184 Chronic kidney disease, stage 4 (severe): Secondary | ICD-10-CM | POA: Diagnosis not present

## 2017-01-20 DIAGNOSIS — Z7984 Long term (current) use of oral hypoglycemic drugs: Secondary | ICD-10-CM | POA: Diagnosis not present

## 2017-01-20 DIAGNOSIS — I5033 Acute on chronic diastolic (congestive) heart failure: Secondary | ICD-10-CM | POA: Diagnosis not present

## 2017-01-20 DIAGNOSIS — R0602 Shortness of breath: Secondary | ICD-10-CM | POA: Diagnosis not present

## 2017-01-20 HISTORY — DX: Heart failure, unspecified: I50.9

## 2017-01-20 LAB — COMPREHENSIVE METABOLIC PANEL
ALBUMIN: 3.1 g/dL — AB (ref 3.5–5.0)
ALK PHOS: 43 U/L (ref 38–126)
ALT: 10 U/L — ABNORMAL LOW (ref 17–63)
ANION GAP: 8 (ref 5–15)
AST: 20 U/L (ref 15–41)
BILIRUBIN TOTAL: 0.2 mg/dL — AB (ref 0.3–1.2)
BUN: 26 mg/dL — ABNORMAL HIGH (ref 6–20)
CALCIUM: 9 mg/dL (ref 8.9–10.3)
CO2: 38 mmol/L — ABNORMAL HIGH (ref 22–32)
Chloride: 98 mmol/L — ABNORMAL LOW (ref 101–111)
Creatinine, Ser: 2.1 mg/dL — ABNORMAL HIGH (ref 0.61–1.24)
GFR calc non Af Amer: 30 mL/min — ABNORMAL LOW (ref >60)
GFR, EST AFRICAN AMERICAN: 35 mL/min — AB (ref >60)
GLUCOSE: 84 mg/dL (ref 65–99)
POTASSIUM: 4 mmol/L (ref 3.5–5.1)
Sodium: 144 mmol/L (ref 135–145)
TOTAL PROTEIN: 6.6 g/dL (ref 6.5–8.1)

## 2017-01-20 LAB — CBC WITH DIFFERENTIAL/PLATELET
Basophils Absolute: 0 10*3/uL (ref 0.0–0.1)
Basophils Relative: 0 %
Eosinophils Absolute: 0.2 10*3/uL (ref 0.0–0.7)
Eosinophils Relative: 3 %
HEMATOCRIT: 36.1 % — AB (ref 39.0–52.0)
Hemoglobin: 10.4 g/dL — ABNORMAL LOW (ref 13.0–17.0)
LYMPHS PCT: 19 %
Lymphs Abs: 1.3 10*3/uL (ref 0.7–4.0)
MCH: 25.3 pg — AB (ref 26.0–34.0)
MCHC: 28.8 g/dL — AB (ref 30.0–36.0)
MCV: 87.8 fL (ref 78.0–100.0)
MONO ABS: 0.5 10*3/uL (ref 0.1–1.0)
Monocytes Relative: 8 %
NEUTROS ABS: 4.8 10*3/uL (ref 1.7–7.7)
Neutrophils Relative %: 70 %
Platelets: 225 10*3/uL (ref 150–400)
RBC: 4.11 MIL/uL — ABNORMAL LOW (ref 4.22–5.81)
RDW: 15.3 % (ref 11.5–15.5)
WBC: 6.9 10*3/uL (ref 4.0–10.5)

## 2017-01-20 LAB — TROPONIN I
TROPONIN I: 0.08 ng/mL — AB (ref <0.03)
TROPONIN I: 0.09 ng/mL — AB (ref <0.03)
TROPONIN I: 0.08 ng/mL — AB (ref <0.03)

## 2017-01-20 LAB — BRAIN NATRIURETIC PEPTIDE: B NATRIURETIC PEPTIDE 5: 104 pg/mL — AB (ref 0.0–100.0)

## 2017-01-20 LAB — GLUCOSE, CAPILLARY
GLUCOSE-CAPILLARY: 142 mg/dL — AB (ref 65–99)
GLUCOSE-CAPILLARY: 92 mg/dL (ref 65–99)

## 2017-01-20 MED ORDER — VITAMIN B-12 1000 MCG PO TABS
1000.0000 ug | ORAL_TABLET | Freq: Every day | ORAL | Status: DC
  Administered 2017-01-20 – 2017-01-25 (×6): 1000 ug via ORAL
  Filled 2017-01-20 (×6): qty 1

## 2017-01-20 MED ORDER — HEPARIN SODIUM (PORCINE) 5000 UNIT/ML IJ SOLN
5000.0000 [IU] | Freq: Three times a day (TID) | INTRAMUSCULAR | Status: DC
  Administered 2017-01-20 – 2017-01-26 (×17): 5000 [IU] via SUBCUTANEOUS
  Filled 2017-01-20 (×17): qty 1

## 2017-01-20 MED ORDER — ASPIRIN EC 81 MG PO TBEC
81.0000 mg | DELAYED_RELEASE_TABLET | Freq: Every day | ORAL | Status: DC
  Administered 2017-01-20 – 2017-01-26 (×7): 81 mg via ORAL
  Filled 2017-01-20 (×7): qty 1

## 2017-01-20 MED ORDER — DOCUSATE SODIUM 50 MG PO CAPS: 50.0000 mg | ORAL_CAPSULE | Freq: Two times a day (BID) | ORAL | Status: DC

## 2017-01-20 MED ORDER — DONEPEZIL HCL 5 MG PO TABS
10.0000 mg | ORAL_TABLET | Freq: Every day | ORAL | Status: DC
  Administered 2017-01-21 – 2017-01-26 (×6): 10 mg via ORAL
  Filled 2017-01-20: qty 2
  Filled 2017-01-20: qty 1
  Filled 2017-01-20: qty 2
  Filled 2017-01-20: qty 1
  Filled 2017-01-20 (×4): qty 2
  Filled 2017-01-20: qty 1

## 2017-01-20 MED ORDER — FINASTERIDE 5 MG PO TABS
5.0000 mg | ORAL_TABLET | Freq: Every day | ORAL | Status: DC
  Administered 2017-01-22 – 2017-01-26 (×5): 5 mg via ORAL
  Filled 2017-01-20 (×7): qty 1

## 2017-01-20 MED ORDER — INSULIN ASPART 100 UNIT/ML ~~LOC~~ SOLN
0.0000 [IU] | SUBCUTANEOUS | Status: DC
  Administered 2017-01-21 (×2): 1 [IU] via SUBCUTANEOUS
  Administered 2017-01-21: 2 [IU] via SUBCUTANEOUS
  Administered 2017-01-22 (×2): 1 [IU] via SUBCUTANEOUS
  Administered 2017-01-22: 2 [IU] via SUBCUTANEOUS
  Administered 2017-01-23 (×2): 1 [IU] via SUBCUTANEOUS

## 2017-01-20 MED ORDER — DOCUSATE SODIUM 50 MG PO CAPS
50.0000 mg | ORAL_CAPSULE | Freq: Every day | ORAL | Status: DC
  Administered 2017-01-20: 50 mg via ORAL
  Filled 2017-01-20 (×3): qty 1

## 2017-01-20 MED ORDER — SODIUM CHLORIDE 0.9 % IV SOLN: 250.0000 mL | INTRAVENOUS | Status: DC | PRN

## 2017-01-20 MED ORDER — DOCUSATE SODIUM 100 MG PO CAPS
100.0000 mg | ORAL_CAPSULE | Freq: Every day | ORAL | Status: DC
  Filled 2017-01-20: qty 1

## 2017-01-20 MED ORDER — METOPROLOL TARTRATE 50 MG PO TABS
50.0000 mg | ORAL_TABLET | Freq: Two times a day (BID) | ORAL | Status: DC
  Administered 2017-01-20 – 2017-01-26 (×11): 50 mg via ORAL
  Filled 2017-01-20 (×13): qty 1

## 2017-01-20 MED ORDER — VITAMIN D 1000 UNITS PO TABS
1000.0000 [IU] | ORAL_TABLET | Freq: Every day | ORAL | Status: DC
  Administered 2017-01-21 – 2017-01-26 (×6): 1000 [IU] via ORAL
  Filled 2017-01-20 (×9): qty 1

## 2017-01-20 MED ORDER — FUROSEMIDE 10 MG/ML IJ SOLN
80.0000 mg | Freq: Once | INTRAMUSCULAR | Status: AC
  Administered 2017-01-20: 80 mg via INTRAVENOUS
  Filled 2017-01-20: qty 8

## 2017-01-20 MED ORDER — SODIUM CHLORIDE 0.9% FLUSH
3.0000 mL | Freq: Two times a day (BID) | INTRAVENOUS | Status: DC
  Administered 2017-01-20 – 2017-01-25 (×11): 3 mL via INTRAVENOUS

## 2017-01-20 MED ORDER — ALLOPURINOL 100 MG PO TABS
100.0000 mg | ORAL_TABLET | Freq: Every day | ORAL | Status: DC
  Administered 2017-01-21 – 2017-01-26 (×6): 100 mg via ORAL
  Filled 2017-01-20 (×6): qty 1

## 2017-01-20 MED ORDER — MAGNESIUM OXIDE 400 (241.3 MG) MG PO TABS
400.0000 mg | ORAL_TABLET | ORAL | Status: DC
  Administered 2017-01-23 – 2017-01-25 (×3): 400 mg via ORAL
  Filled 2017-01-20 (×4): qty 1

## 2017-01-20 MED ORDER — SODIUM CHLORIDE 0.9% FLUSH: 3.0000 mL | INTRAVENOUS | Status: DC | PRN

## 2017-01-20 MED ORDER — TERAZOSIN HCL 5 MG PO CAPS
5.0000 mg | ORAL_CAPSULE | Freq: Every day | ORAL | Status: DC
  Administered 2017-01-20 – 2017-01-25 (×6): 5 mg via ORAL
  Filled 2017-01-20 (×6): qty 1

## 2017-01-20 NOTE — ED Notes (Signed)
Waiting for room to be clean. Molli Hazard will call to notify when ready.

## 2017-01-20 NOTE — ED Triage Notes (Addendum)
Pt from heart care, MD wanted pt to have pt admitted for possible CHF to diurese.  Pt denies cp or sob. Pt wife keeps log of weights since June 21st. Pt was 296lbs on June 21st and is now 314.8 lbs.

## 2017-01-20 NOTE — ED Provider Notes (Signed)
[facility]-EMER[facility]ENCY DEPT Provider Note   CSN: [id number] Arrival date & time: 01/20/17  1331     History   Chief Complaint Chief Complaint  Patient presents with  . Congestive Heart Failure    HPI Fernando [name] is a 71 y.o. male.  HPI Patient referred from his primary physician's office for fluid overload. Noted to be hypoxic with saturations into the 80s. Patient states she's been compliant with his furosemide. He's had increased swelling to his bilateral lower extremities. He's had increased dyspnea with exertion. Denies any chest pain. No recent fever or chills. Wife notes 20 pound weight gain the past few months. Past Medical History:  Diagnosis Date  . Arthritis   . CHF (congestive heart failure) (HCC)   . Diabetes mellitus without complication (HCC)   . Hypertension   . Renal disorder     Patient Active Problem List   Diagnosis Date Noted  . Acute diastolic CHF (congestive heart failure) (HCC)   . SVT (supraventricular tachycardia) (HCC)   . Elevated troponin   . Morbid obesity (HCC) 11/09/2016  . SOB (shortness of breath) 11/09/2016  . AKI (acute kidney injury) (HCC) 01/29/2016  . Single kidney 01/29/2016  . Renal insufficiency 01/29/2016  . HTN (hypertension) 01/29/2016  . Diabetes (HCC) 01/29/2016  . CHF (congestive heart failure) (HCC) 01/29/2016  . Hypoxia 01/29/2016  . Tachycardia 01/29/2016  . Hypotension 01/29/2016    Past Surgical History:  Procedure Laterality Date  . BACK SURGERY    . NEPHRECTOMY Left 1967       Home Medications    Prior to Admission medications   Medication Sig Start Date End Date Taking? Authorizing Provider  allopurinol (ZYLOPRIM) 100 MG tablet Take 100 mg by mouth daily with breakfast.   Yes [provider]  aspirin EC 81 MG tablet Take 1 tablet (81 mg total) by mouth daily. Patient taking differently: Take 81 mg by mouth daily. Every other day 11/15/16  Yes Randel Pigg, Dorma Russell, MD  cholecalciferol (VITAMIN D)  1000 units tablet Take 1,000 Units by mouth daily with breakfast.    Yes [provider]  docusate sodium (COLACE) 50 MG capsule Take 50-100 mg by mouth 2 (two) times daily. 2 capsules in am and 1 capsule at night   Yes [provider]  donepezil (ARICEPT) 10 MG tablet Take 10 mg by mouth daily with breakfast.    Yes [provider]  finasteride (PROSCAR) 5 MG tablet Take 5 mg by mouth daily with breakfast.    Yes [provider]  glipiZIDE (GLUCOTROL) 5 MG tablet Take 5 mg by mouth 2 (two) times daily before a meal.   Yes [provider]  magnesium oxide (MAG-OX) 400 MG tablet Take 400 mg by mouth every Monday, Wednesday, and Friday.    Yes [provider]  metoprolol tartrate (LOPRESSOR) 50 MG tablet Take 1 tablet (50 mg total) by mouth 2 (two) times daily. 11/15/16  Yes Randel Pigg, Dorma Russell, MD  terazosin (HYTRIN) 5 MG capsule Take 5 mg by mouth daily with supper.    Yes [provider]  torsemide (DEMADEX) 20 MG tablet Take 40 mg (2 Tablet in the AM ) and 20 mg ( 1 Tablet in the PM) 12/20/16  Yes Jodelle Gross, NP  vitamin B-12 (CYANOCOBALAMIN) 1000 MCG tablet Take 1,000 mcg by mouth daily with supper.    Yes [provider]    Family History Family History  Problem Relation Age of Onset  .  Heart disease Mother     Social History Social History  Substance Use Topics  . Smoking status: Former Smoker    Quit date: 05/30/1970  . Smokeless tobacco: Never Used  . Alcohol use No     Allergies   Ibuprofen   Review of Systems Review of Systems  Constitutional: Negative for chills and fever.  Respiratory: Positive for shortness of breath. Negative for cough and wheezing.   Cardiovascular: Positive for leg swelling. Negative for chest pain and palpitations.  Gastrointestinal: Negative for abdominal pain, constipation, diarrhea, nausea and vomiting.  Genitourinary: Negative for dysuria, flank pain and hematuria.    Musculoskeletal: Negative for back pain and myalgias.  Skin: Negative for rash and wound.  Neurological: Negative for dizziness, weakness, light-headedness and numbness.  All other systems reviewed and are negative.    Physical Exam Updated Vital Signs BP (!) 111/53   Pulse 74   Temp 98.2 F (36.8 C) (Oral)   Resp (!) 21   Ht 6' (1.829 m)   Wt (!) 145.2 kg (320 lb)   SpO2 95%   BMI 43.40 kg/m   Physical Exam  Constitutional: He is oriented to person, place, and time. He appears well-developed and well-nourished. No distress.  HENT:  Head: Normocephalic and atraumatic.  Mouth/Throat: Oropharynx is clear and moist. No oropharyngeal exudate.  Eyes: Pupils are equal, round, and reactive to light. EOM are normal.  Neck: Normal range of motion. Neck supple.  Cardiovascular: Normal rate and regular rhythm.  Exam reveals no gallop and no friction rub.   No murmur heard. Pulmonary/Chest: Effort normal. He has rales.  Patient with crackles in bilateral lung fields.   Abdominal: Soft. Bowel sounds are normal. He exhibits distension. There is no tenderness. There is no rebound and no guarding.  Musculoskeletal: Normal range of motion. He exhibits edema. He exhibits no tenderness.  3+ bilateral lower extremity edema. Patient has a small wound over the anterior tibial surface of the left lower extremity. Weeping small amount of clear fluid.  Neurological: He is alert and oriented to person, place, and time.  Skin: Skin is warm and dry. No rash noted. No erythema.  Psychiatric: He has a normal mood and affect. His behavior is normal.  Nursing note and vitals reviewed.    ED Treatments / Results  Labs (all labs ordered are listed, but only abnormal results are displayed) Labs Reviewed  CBC WITH DIFFERENTIAL/PLATELET - Abnormal; Notable for the following:       Result Value   RBC 4.11 (*)    Hemoglobin 10.4 (*)    HCT 36.1 (*)    MCH 25.3 (*)    MCHC 28.8 (*)    All other  components within normal limits  COMPREHENSIVE METABOLIC PANEL - Abnormal; Notable for the following:    Chloride 98 (*)    CO2 38 (*)    BUN 26 (*)    Creatinine, Ser 2.10 (*)    Albumin 3.1 (*)    ALT 10 (*)    Total Bilirubin 0.2 (*)    GFR calc non Af Amer 30 (*)    GFR calc Af Amer 35 (*)    All other components within normal limits  BRAIN NATRIURETIC PEPTIDE - Abnormal; Notable for the following:    B Natriuretic Peptide 104.0 (*)    All other components within normal limits  TROPONIN I - Abnormal; Notable for the following:    Troponin I 0.08 (*)    All other components  within normal limits    EKG  EKG Interpretation  Date/Time:  Friday January 20 2017 13:39:20 EDT Ventricular Rate:  70 PR Interval:    QRS Duration: 107 QT Interval:  378 QTC Calculation: 390 R Axis:   86 Text Interpretation:  Sinus or ectopic atrial rhythm Multiple premature complexes, vent & supraven Borderline right axis deviation Low voltage, precordial leads Minimal ST elevation, lateral leads Baseline wander in lead(s) V6 Confirmed by Ranae Palms  MD, Samanthan Dugo ([id number]) on 01/20/2017 2:04:42 PM       Radiology Dg Chest Port 1 View  Result Date: 01/20/2017 CLINICAL DATA:  Shortness of Breath EXAM: PORTABLE CHEST 1 VIEW COMPARISON:  November 18, 2016 FINDINGS: There is cardiomegaly with mild pulmonary venous hypertension. There is no frank edema or consolidation. There is atelectasis in the right base. No adenopathy. No bone lesions. IMPRESSION: Pulmonary vascular congestion. No frank edema or consolidation. Right base atelectasis. Electronically Signed   By: Bretta Bang III M.D.   On: 01/20/2017 14:26    Procedures Procedures (including critical care time)  Medications Ordered in ED Medications  furosemide (LASIX) injection 80 mg (80 mg Intravenous Given 01/20/17 1405)     Initial Impression / Assessment and Plan / ED Course  I have reviewed the triage vital signs and the nursing  notes.  Pertinent labs & imaging results that were available during my care of the patient were reviewed by me and considered in my medical decision making (see chart for details).   given IV Lasix in the emergency department. Discussed with hospitalist who will admit for congestive heart failure.    Final Clinical Impressions(s) / ED Diagnoses   Final diagnoses:  Acute combined systolic and diastolic congestive heart failure [facility])    New Prescriptions New Prescriptions   No medications on file     Loren Racer, MD 01/20/17 1531

## 2017-01-20 NOTE — H&P (Signed)
History and Physical    Fernando [name]:[id number] DOB: 18-Feb-1946 DOA: 01/20/2017  Referring MD/NP/PA: Ranae Palms PCP: [facility]  Outpatient Specialists: Cards- Elayne Snare NP today.  Patient coming from: Cardiology office   Chief Complaint: Acute resp failure w/ hypoxia, Acute on chronic diastolic heart failure   HPI: Fernando [name] is a 71 y.o. male with medical history significant of CHF, DM, chronic kidney disease s/ baseline Cr around 2 s/p solitary kidney presentig w/ acute resp failure w/ hypoxia. Per the wife, pt was evaluated at outpt cardiology today in settting of prior dx of CHF. Pt noted to be significantly dyspneic at office visit and was subsequently sent to ER. Per the wife, the patient has gained approximately 20 lbs over the past 2 months from a weight of 296 lbs to 315 lbs. Pt has been taking torsemide 40 in am and 20 mg in evening per wife. Has compliant w/ regimen. Denies taking any NSAIDs in setting of solitary kidney. Does not directly report eating salty foods, though pt is reported to eat a lot of junk food. No CP. + Orthopnea and progressive exertional dyspnea.   ED Course: Presented to ER afebrile, hemodynamically stable. Satting in 80s on RA. Cr 2.1. CXR w/ pulm vascular congestion. No cardiomegaly. BNP 104. Trop 0.08.  Review of Systems: As per HPI otherwise 10 point review of systems negative.    Past Medical History:  Diagnosis Date  . Arthritis   . CHF (congestive heart failure) (HCC)   . Diabetes mellitus without complication (HCC)   . Hypertension   . Renal disorder     Past Surgical History:  Procedure Laterality Date  . BACK SURGERY    . NEPHRECTOMY Left 1967     reports that he quit smoking about 46 years ago. He has never used smokeless tobacco. He reports that he does not drink alcohol or use drugs.  Allergies  Allergen Reactions  . Ibuprofen Other (See Comments)    Due to one kidney    Family History  Problem  Relation Age of Onset  . Heart disease Mother      Prior to Admission medications   Medication Sig Start Date End Date Taking? Authorizing Provider  allopurinol (ZYLOPRIM) 100 MG tablet Take 100 mg by mouth daily with breakfast.   Yes [provider]  aspirin EC 81 MG tablet Take 1 tablet (81 mg total) by mouth daily. Patient taking differently: Take 81 mg by mouth daily. Every other day 11/15/16  Yes Randel Pigg, Dorma Russell, MD  cholecalciferol (VITAMIN D) 1000 units tablet Take 1,000 Units by mouth daily with breakfast.    Yes [provider]  docusate sodium (COLACE) 50 MG capsule Take 50-100 mg by mouth 2 (two) times daily. 2 capsules in am and 1 capsule at night   Yes [provider]  donepezil (ARICEPT) 10 MG tablet Take 10 mg by mouth daily with breakfast.    Yes [provider]  finasteride (PROSCAR) 5 MG tablet Take 5 mg by mouth daily with breakfast.    Yes [provider]  glipiZIDE (GLUCOTROL) 5 MG tablet Take 5 mg by mouth 2 (two) times daily before a meal.   Yes [provider]  magnesium oxide (MAG-OX) 400 MG tablet Take 400 mg by mouth every Monday, Wednesday, and Friday.    Yes [provider]  metoprolol tartrate (LOPRESSOR) 50 MG tablet Take 1 tablet (50 mg total) by mouth 2 (two) times daily.  11/15/16  Yes Randel Pigg, Dorma Russell, MD  terazosin (HYTRIN) 5 MG capsule Take 5 mg by mouth daily with supper.    Yes [provider]  torsemide (DEMADEX) 20 MG tablet Take 40 mg (2 Tablet in the AM ) and 20 mg ( 1 Tablet in the PM) 12/20/16  Yes Jodelle Gross, NP  vitamin B-12 (CYANOCOBALAMIN) 1000 MCG tablet Take 1,000 mcg by mouth daily with supper.    Yes [provider]    Physical Exam: Vitals:   01/20/17 1341 01/20/17 1400 01/20/17 1430 01/20/17 1441  BP:  136/65 (!) 111/53   Pulse:  (!) 57  74  Resp:  20  (!) 21  Temp:      TempSrc:      SpO2: 95% 94%  95%  Weight:      Height:           Constitutional: NAD, calm, minimal to mild distress  Vitals:   01/20/17 1341 01/20/17 1400 01/20/17 1430 01/20/17 1441  BP:  136/65 (!) 111/53   Pulse:  (!) 57  74  Resp:  20  (!) 21  Temp:      TempSrc:      SpO2: 95% 94%  95%  Weight:      Height:       Eyes: PERRL, lids and conjunctivae normal ENMT: Mucous membranes are moist. Posterior pharynx clear of any exudate or lesions.Normal dentition.  Neck: normal, supple, no masses, no thyromegaly Respiratory: clear to auscultation bilaterally, no wheezing, no crackles. Normal respiratory effort. No accessory muscle use.  Cardiovascular: Regular rate and rhythm, no murmurs / rubs / gallops. 2-3+ pitting edema in LEs Abdomen: Obese abdomen. no tenderness, no masses palpated. No hepatosplenomegaly. Bowel sounds positive.  Musculoskeletal: no clubbing / cyanosis. No joint deformity upper and lower extremities. Good ROM, no contractures. Normal muscle tone.  Skin: no rashes, lesions, ulcers. No induration Neurologic: CN 2-12 grossly intact. Sensation intact, DTR normal. Strength 5/5 in all 4.  Psychiatric: Normal judgment and insight. Alert and oriented x 3. Normal mood.    Labs on Admission: I have personally reviewed following labs and imaging studies  CBC:  Recent Labs Lab 01/20/17 1410  WBC 6.9  NEUTROABS 4.8  HGB 10.4*  HCT 36.1*  MCV 87.8  PLT 225   Basic Metabolic Panel:  Recent Labs Lab 01/16/17 1257 01/20/17 1410  NA 141 144  K 3.7 4.0  CL 97* 98*  CO2 35* 38*  GLUCOSE 93 84  BUN 29* 26*  CREATININE 2.08* 2.10*  CALCIUM 8.6* 9.0   GFR: Estimated Creatinine Clearance: 47 mL/min (A) (by C-G formula based on SCr of 2.1 mg/dL (H)). Liver Function Tests:  Recent Labs Lab 01/20/17 1410  AST 20  ALT 10*  ALKPHOS 43  BILITOT 0.2*  PROT 6.6  ALBUMIN 3.1*   No results for input(s): LIPASE, AMYLASE in the last 168 hours. No results for input(s): AMMONIA in the last 168 hours. Coagulation  Profile: No results for input(s): INR, PROTIME in the last 168 hours. Cardiac Enzymes:  Recent Labs Lab 01/20/17 1410  TROPONINI 0.08*   BNP (last 3 results) No results for input(s): PROBNP in the last 8760 hours. HbA1C: No results for input(s): HGBA1C in the last 72 hours. CBG: No results for input(s): GLUCAP in the last 168 hours. Lipid Profile: No results for input(s): CHOL, HDL, LDLCALC, TRIG, CHOLHDL, LDLDIRECT in the last 72 hours. Thyroid Function Tests: No results for input(s): TSH, T4TOTAL, FREET4,  T3FREE, THYROIDAB in the last 72 hours. Anemia Panel: No results for input(s): VITAMINB12, FOLATE, FERRITIN, TIBC, IRON, RETICCTPCT in the last 72 hours. Urine analysis:    Component Value Date/Time   COLORURINE STRAW (A) 11/18/2016 1340   APPEARANCEUR CLEAR 11/18/2016 1340   LABSPEC 1.005 11/18/2016 1340   PHURINE 6.0 11/18/2016 1340   GLUCOSEU NEGATIVE 11/18/2016 1340   HGBUR NEGATIVE 11/18/2016 1340   BILIRUBINUR NEGATIVE 11/18/2016 1340   KETONESUR NEGATIVE 11/18/2016 1340   PROTEINUR NEGATIVE 11/18/2016 1340   UROBILINOGEN 0.2 07/15/2009 2249   NITRITE NEGATIVE 11/18/2016 1340   LEUKOCYTESUR NEGATIVE 11/18/2016 1340   Sepsis Labs: @LABRCNTIP (procalcitonin:4,lacticidven:4) )No results found for this or any previous visit (from the past 240 hour(s)).   Radiological Exams on Admission: Dg Chest Port 1 View  Result Date: 01/20/2017 CLINICAL DATA:  Shortness of Breath EXAM: PORTABLE CHEST 1 VIEW COMPARISON:  November 18, 2016 FINDINGS: There is cardiomegaly with mild pulmonary venous hypertension. There is no frank edema or consolidation. There is atelectasis in the right base. No adenopathy. No bone lesions. IMPRESSION: Pulmonary vascular congestion. No frank edema or consolidation. Right base atelectasis. Electronically Signed   By: Bretta Bang III M.D.   On: 01/20/2017 14:26    EKG: Independently reviewed: nonspecific ST and T wave changes grossly unchanged from  previous EKGs.   Assessment/Plan Active Problems:   Elevated troponin   Acute on chronic diastolic CHF (congestive heart failure) (HCC)   Acute respiratory failure with hypoxia (HCC)  1-Acute resp failure -likely secondary to progressive volume overload in setting of acute on chronic diastolic CHF   -noted 20 lb weight gain over last  2 months  -morbid obesity likely confounding issue (? Subacute vs. Chronic OSA) -10/2016 d/c weight was 295 lbs; weight today 320 - increase diuretic regimen to 60 IV lasix BID acutely  -strict Is and Os and daily weights   2-Acute on chronic diastolic CHF -CXR w/ pulm vascular congestion  -BNP 100s  -most recent echo 10/2016 w/ EF 55%- not able to properly assess diastolic function  - increase diuretic regimen to 60 IV lasix BID acutely  -follow renal function and volume status closely -noted trop 0.08 on admission- baseline from prior admissions  -suspect mild demand ischemia in setting of above w/ chronic renal failure  -trend -cont baby ASA -pt sent to ER from cards office -formal cardiology c/s as clinically indicated   3-CKD  -solitary kidney s/p nephrectomy -baseline Cr around 2 -Cr 2.1 today  -stage 3-4 based on GFR -suspect narrow diuresis window w/ unilateral kidney  -renal c/s as clinically indicated   4-DM  -SSI -A1C   DVT prophylaxis: sub q heparin   Code Status: Full Code   Family Communication: Wife at bedside   Disposition Plan: Pending further evaluation   Consults called: Pending call back from renal   Admission status: Inpt    Floydene Flock MD [facility] Pager [phone]  If 7PM-7AM, please contact night-coverage [email or web address] Password [facility]  01/20/2017, 3:44 PM

## 2017-01-20 NOTE — ED Notes (Signed)
Pt placed on 2L 02 [place].   

## 2017-01-20 NOTE — ED Notes (Signed)
CRITICAL VALUE ALERT  Critical Value: troponin 0.08  Date & Time Notied:  01/20/17 1503  Provider Notified:dr yelverton  Orders Received/Actions taken:

## 2017-01-20 NOTE — Progress Notes (Signed)
Cardiology Consult note.    Date:  01/20/2017   ID:  Oleh Genin, DOB 1946-03-02, MRN [id number]  PCP:  [facility]  Cardiologist:  Wyline Mood Chief Complaint  Patient presents with  . Congestive Heart Failure  . Palpitations   History of Present Illness: Fernando [name] is a 71 y.o. male who presents for ongoing assessment and management of chronic diastolic heart failure, history of paroxysmal supraventricular tachycardia, chronic kidney disease, questionable sleep apnea. The patient was last seen in the office on 12/20/2016. The patient had gained 7 pounds since prior office visit and had not adhered to a low-sodium diet. He admitted to eating out a lot, and eating a lot of salty foods. He had increased his dose of Lasix, and had begun to feel better despite weight gain. We are seeing him for decompensated diastolic CHF at the request of Dr. Kerry Hough, Hospitalist service.   He is sent to ER after office follow up for worsening edema with failed OP therapy. He has gained an additional 7 lbs since being seen last along with a total weight gain of > 20 lbs since recent hospitalization in 10/2016.   I changed his diuretic regimen from Lasix to torsemide, 40 mg twice a day. He is going to take it on an empty stomach, he was to avoid salty foods especially [place]. He was going to weigh himself daily. If he continued to gain weight, have worsening abdominal distention, he would probably need to be admitted for IV diuresis. A follow-up BMET was ordered in the setting of chronic kidney disease with a baseline creatinine of 1.8.  Follow-up BMET: Sodium 141, potassium 3.7, chloride 97, CO2 35, glucose 93, BUN 29, creatinine 2.08 (decreased from 2.20).   He has had worsening LEE, now up into his thighs, groin, abdomen. He admits to eating salty foods, eating out, and eating a lot of M&Ms. His clothes have gotten tighter and he is have more DOE. He sleeps in a chair and uses a urinal during  the night but is sometimes incontinent according to his wife.  She works and he goes out during the day with friends and family.   Past Medical History:  Diagnosis Date  . Arthritis   . Diabetes mellitus without complication (HCC)   . Hypertension   . Renal disorder     Past Surgical History:  Procedure Laterality Date  . BACK SURGERY    . NEPHRECTOMY Left 1967     Current Outpatient Prescriptions  Medication Sig Dispense Refill  . allopurinol (ZYLOPRIM) 100 MG tablet Take 100 mg by mouth daily with breakfast.    . aspirin EC 81 MG tablet Take 1 tablet (81 mg total) by mouth daily.    . cholecalciferol (VITAMIN D) 1000 units tablet Take 1,000 Units by mouth daily with breakfast.     . docusate sodium (COLACE) 50 MG capsule Take 50 mg by mouth 2 (two) times daily.    Marland Kitchen donepezil (ARICEPT) 10 MG tablet Take 10 mg by mouth daily with breakfast.     . finasteride (PROSCAR) 5 MG tablet Take 5 mg by mouth daily with breakfast.     . glipiZIDE (GLUCOTROL) 5 MG tablet Take 5 mg by mouth 2 (two) times daily before a meal.    . magnesium oxide (MAG-OX) 400 MG tablet Take 400 mg by mouth every Monday, Wednesday, and Friday.     . metoprolol tartrate (LOPRESSOR) 50 MG tablet Take 1 tablet (50 mg total)  by mouth 2 (two) times daily. 60 tablet 0  . terazosin (HYTRIN) 5 MG capsule Take 5 mg by mouth daily with supper.     . torsemide (DEMADEX) 20 MG tablet Take 40 mg (2 Tablet in the AM ) and 20 mg ( 1 Tablet in the PM) 180 tablet 3  . vitamin B-12 (CYANOCOBALAMIN) 1000 MCG tablet Take 1,000 mcg by mouth daily with supper.      No current facility-administered medications for this visit.     Allergies:   Ibuprofen    Social History:  The patient  reports that he quit smoking about 46 years ago. He has never used smokeless tobacco. He reports that he does not drink alcohol or use drugs.   Family History:  The patient's family history includes Heart disease in his mother.    ROS: All other  systems are reviewed and negative. Unless otherwise mentioned in H&P    PHYSICAL EXAM: VS:  BP 130/80   Pulse 72   Ht 6' (1.829 m)   Wt (!) 320 lb 9.6 oz (145.4 kg)   BMI 43.48 kg/m  , BMI Body mass index is 43.48 kg/m. GEN: Well nourished, well developed, in no acute distress  HEENT: normal  Neck: no JVD, carotid bruits, or masses Cardiac: RRR; distant heart sounds, no murmurs, rubs, or gallops,no edema  Respiratory:  clear to auscultation bilaterally, normal work of breathing GI: Very distended, + BS MS: no deformity or atrophy  Wearing TED hose, with very tight edema, up into the thighs, and possibly the groin.  Skin: warm and dry, no rash Neuro:  Strength and sensation are intact Psych: euthymic mood, full affect  Recent Labs: 11/18/2016: ALT 15; B Natriuretic Peptide 23.3; TSH 1.487 11/20/2016: Hemoglobin 10.4; Platelets 241 12/06/2016: Magnesium 1.9 01/16/2017: BUN 29; Creatinine, Ser 2.08; Potassium 3.7; Sodium 141    Lipid Panel    Component Value Date/Time   CHOL  07/16/2009 0415    160        ATP III CLASSIFICATION:  <200     mg/dL   Desirable  161-096  mg/dL   Borderline High  >=045    mg/dL   High          TRIG 409 07/16/2009 0415   HDL 29 (L) 07/16/2009 0415   CHOLHDL 5.5 07/16/2009 0415   VLDL 28 07/16/2009 0415   LDLCALC (H) 07/16/2009 0415    103        Total Cholesterol/HDL:CHD Risk Coronary Heart Disease Risk Table                     Men   Women  1/2 Average Risk   3.4   3.3  Average Risk       5.0   4.4  2 X Average Risk   9.6   7.1  3 X Average Risk  23.4   11.0        Use the calculated Patient Ratio above and the CHD Risk Table to determine the patient's CHD Risk.        ATP III CLASSIFICATION (LDL):  <100     mg/dL   Optimal  811-914  mg/dL   Near or Above                    Optimal  130-159  mg/dL   Borderline  782-956  mg/dL   High  >213     mg/dL   Very High  Wt Readings from Last 3 Encounters:  01/20/17 (!) 320 lb 9.6 oz  (145.4 kg)  12/20/16 (!) 313 lb (142 kg)  12/06/16 (!) 305 lb (138.3 kg)      Other studies Reviewed: Echocardiogram 11-17-16  - Left ventricle: The cavity size was normal. Wall thickness was   increased in a pattern of moderate LVH. Systolic function was   normal. The estimated ejection fraction was in the range of 55%   to 60%. The study is not technically sufficient to allow   evaluation of LV diastolic function. - Mitral valve: Calcified annulus. Mildly thickened leaflets . - Left atrium: The atrium was moderately dilated. - Right ventricle: Not well seen on subcostal appears mildly   dilated on apical views.  ASSESSMENT AND PLAN:  1. Acute on Chronic Diastolic CHF: Weight gain of 20 lbs with significant edema, abdominal distention despite change in OP therapy. He has not adhered to low sodium diet, and eat out a lot. His wife states that he eats a lot of M&Ms as well. I am sending to ER for admission and IV diureses. He is willing to have foley catheter due to aggressive diureses or condemn catheter. He will likely be in the hospital for 3-4 days of diureses.               2. CKD: Single kidney: He has been seen by Dr. Fausto Skillern and has a kidney US planned for August 27th. This can be completed as IP. Recommend possible nephrology consultation.   3. Dietary Non-compliance: Morbid obesity:  His wife is requesting a nutrition or dietary consultation.  .  Current medicines are reviewed at length with the patient today.    Labs/ tests ordered today include:  Bettey Mare. Liborio Nixon, ANP, AACC   01/20/2017 1:28 PM    [facility] [address] [place], [place] [address] Phone: [phone]; Fax: [phone]

## 2017-01-21 DIAGNOSIS — J9601 Acute respiratory failure with hypoxia: Secondary | ICD-10-CM

## 2017-01-21 LAB — COMPREHENSIVE METABOLIC PANEL
ALBUMIN: 3 g/dL — AB (ref 3.5–5.0)
ALT: 9 U/L — ABNORMAL LOW (ref 17–63)
ANION GAP: 6 (ref 5–15)
AST: 12 U/L — ABNORMAL LOW (ref 15–41)
Alkaline Phosphatase: 41 U/L (ref 38–126)
BILIRUBIN TOTAL: 0.3 mg/dL (ref 0.3–1.2)
BUN: 24 mg/dL — ABNORMAL HIGH (ref 6–20)
CHLORIDE: 97 mmol/L — AB (ref 101–111)
CO2: 42 mmol/L — AB (ref 22–32)
Calcium: 9 mg/dL (ref 8.9–10.3)
Creatinine, Ser: 2.03 mg/dL — ABNORMAL HIGH (ref 0.61–1.24)
GFR calc Af Amer: 36 mL/min — ABNORMAL LOW (ref >60)
GFR calc non Af Amer: 31 mL/min — ABNORMAL LOW (ref >60)
GLUCOSE: 146 mg/dL — AB (ref 65–99)
POTASSIUM: 4.1 mmol/L (ref 3.5–5.1)
SODIUM: 145 mmol/L (ref 135–145)
TOTAL PROTEIN: 6.3 g/dL — AB (ref 6.5–8.1)

## 2017-01-21 LAB — HEMOGLOBIN A1C
Hgb A1c MFr Bld: 6.4 % — ABNORMAL HIGH (ref 4.8–5.6)
Mean Plasma Glucose: 136.98 mg/dL

## 2017-01-21 LAB — CBC
HCT: 36.1 % — ABNORMAL LOW (ref 39.0–52.0)
Hemoglobin: 10.4 g/dL — ABNORMAL LOW (ref 13.0–17.0)
MCH: 25.9 pg — ABNORMAL LOW (ref 26.0–34.0)
MCHC: 28.8 g/dL — AB (ref 30.0–36.0)
MCV: 89.8 fL (ref 78.0–100.0)
PLATELETS: 275 10*3/uL (ref 150–400)
RBC: 4.02 MIL/uL — ABNORMAL LOW (ref 4.22–5.81)
RDW: 15.3 % (ref 11.5–15.5)
WBC: 6.8 10*3/uL (ref 4.0–10.5)

## 2017-01-21 LAB — GLUCOSE, CAPILLARY
GLUCOSE-CAPILLARY: 126 mg/dL — AB (ref 65–99)
GLUCOSE-CAPILLARY: 114 mg/dL — AB (ref 65–99)
Glucose-Capillary: 152 mg/dL — ABNORMAL HIGH (ref 65–99)
Glucose-Capillary: 129 mg/dL — ABNORMAL HIGH (ref 65–99)

## 2017-01-21 LAB — TROPONIN I: TROPONIN I: 0.09 ng/mL — AB (ref <0.03)

## 2017-01-21 MED ORDER — FUROSEMIDE 10 MG/ML IJ SOLN
60.0000 mg | Freq: Two times a day (BID) | INTRAMUSCULAR | Status: DC
  Administered 2017-01-21 – 2017-01-22 (×2): 60 mg via INTRAVENOUS
  Filled 2017-01-21 (×2): qty 6

## 2017-01-21 MED ORDER — DOCUSATE SODIUM 100 MG PO CAPS
100.0000 mg | ORAL_CAPSULE | Freq: Two times a day (BID) | ORAL | Status: DC
  Administered 2017-01-21 – 2017-01-26 (×10): 100 mg via ORAL
  Filled 2017-01-21 (×10): qty 1

## 2017-01-21 NOTE — Progress Notes (Signed)
PROGRESS NOTE    Fernando [name]  VCB:[id number] DOB: 1946-01-20 DOA: 01/20/2017 PCP: [facility]     Brief Narrative:  71 year old man admitted to the hospital on 8/24 after a visit to the cardiologist's office due to shortness of breath. Admission requested due to acute on chronic diastolic CHF with significant volume overload.   Assessment & Plan:   Active Problems:   Elevated troponin   Acute on chronic diastolic CHF (congestive heart failure) (HCC)   Acute respiratory failure with hypoxia (HCC)   Acute hypoxemic respiratory failure -Due to acute CHF. -Continue to an oxygen as tolerated. -See below for details.  Acute on chronic diastolic CHF  -echo in June 2018 shows an ejection fraction of 55%, however not able to properly assess diastolic function due to body habitus. -Received 1 dose of 80 mg of Lasix yesterday, has diuresed a total of 2.1 L. -Start Lasix 60 mg IV every 12 hours . -Continue metoprolol, not on ACE inhibitor due to chronic kidney disease.   chronic kidney disease stage 3-4  -Creatinine is at baseline of around 2-2.2.   Diabetes -Well controlled, continue current regimen.  Elevated troponin -Maximum elevation of 0.09, no chest pain. Likely secondary to acute CHF. No plans for further cardiac interventions at this time    DVT prophylaxis: SQ heparin Code Status: Full Code Family Communication: wife at bedside updated on plan of care and all questions answered. Disposition Plan: to be determined pending medical stability  Consultants:   None  Procedures:   None  Antimicrobials:  Anti-infectives    None       Subjective: Still feels SOB, denies CO, is sleepy  Objective: Vitals:   01/20/17 1858 01/20/17 2015 01/20/17 2100 01/21/17 0510  BP: 113/69  111/65 (!) 115/55  Pulse: 70  75 (!) 137  Resp: (!) 21  20 20   Temp: 98.4 F (36.9 C)  98.2 F (36.8 C) 98.9 F (37.2 C)  TempSrc: Oral  Oral Oral  SpO2: 97% (!)  82% 95% 93%  Weight:    (!) 144.8 kg (319 lb 3.6 oz)  Height:        Intake/Output Summary (Last 24 hours) at 01/21/17 1314 Last data filed at 01/21/17 0930  Gross per 24 hour  Intake              240 ml  Output             2375 ml  Net            -2135 ml   Filed Weights   01/20/17 1334 01/21/17 0510  Weight: (!) 145.2 kg (320 lb) (!) 144.8 kg (319 lb 3.6 oz)    Examination:  General exam: Alert, awake, oriented x 3, morbidly obese Respiratory system: Bibasilar crackles Cardiovascular system:RRR. No murmurs, rubs, gallops. Gastrointestinal system: Abdomen is nondistended, soft and nontender. No organomegaly or masses felt. Normal bowel sounds heard. Central nervous system: Alert and oriented. No focal neurological deficits. Extremities: 2+ bilateral pitting edema Skin: No rashes, lesions or ulcers Psychiatry: Judgement and insight appear normal. Mood & affect appropriate.     Data Reviewed: I have personally reviewed following labs and imaging studies  CBC:  Recent Labs Lab 01/20/17 1410 01/21/17 0411  WBC 6.9 6.8  NEUTROABS 4.8  --   HGB 10.4* 10.4*  HCT 36.1* 36.1*  MCV 87.8 89.8  PLT 225 275   Basic Metabolic Panel:  Recent Labs Lab 01/16/17 1257 01/20/17 1410 01/21/17  0411  NA 141 144 145  K 3.7 4.0 4.1  CL 97* 98* 97*  CO2 35* 38* 42*  GLUCOSE 93 84 146*  BUN 29* 26* 24*  CREATININE 2.08* 2.10* 2.03*  CALCIUM 8.6* 9.0 9.0   GFR: Estimated Creatinine Clearance: 48.6 mL/min (A) (by C-G formula based on SCr of 2.03 mg/dL (H)). Liver Function Tests:  Recent Labs Lab 01/20/17 1410 01/21/17 0411  AST 20 12*  ALT 10* 9*  ALKPHOS 43 41  BILITOT 0.2* 0.3  PROT 6.6 6.3*  ALBUMIN 3.1* 3.0*   No results for input(s): LIPASE, AMYLASE in the last 168 hours. No results for input(s): AMMONIA in the last 168 hours. Coagulation Profile: No results for input(s): INR, PROTIME in the last 168 hours. Cardiac Enzymes:  Recent Labs Lab 01/20/17 1410  01/20/17 1725 01/20/17 2121 01/21/17 0411  TROPONINI 0.08* 0.09* 0.08* 0.09*   BNP (last 3 results) No results for input(s): PROBNP in the last 8760 hours. HbA1C:  Recent Labs  01/20/17 2121  HGBA1C 6.4*   CBG:  Recent Labs Lab 01/20/17 2021 01/20/17 2356 01/21/17 0753 01/21/17 1132  GLUCAP 92 142* 152* 126*   Lipid Profile: No results for input(s): CHOL, HDL, LDLCALC, TRIG, CHOLHDL, LDLDIRECT in the last 72 hours. Thyroid Function Tests: No results for input(s): TSH, T4TOTAL, FREET4, T3FREE, THYROIDAB in the last 72 hours. Anemia Panel: No results for input(s): VITAMINB12, FOLATE, FERRITIN, TIBC, IRON, RETICCTPCT in the last 72 hours. Urine analysis:    Component Value Date/Time   COLORURINE STRAW (A) 11/18/2016 1340   APPEARANCEUR CLEAR 11/18/2016 1340   LABSPEC 1.005 11/18/2016 1340   PHURINE 6.0 11/18/2016 1340   GLUCOSEU NEGATIVE 11/18/2016 1340   HGBUR NEGATIVE 11/18/2016 1340   BILIRUBINUR NEGATIVE 11/18/2016 1340   KETONESUR NEGATIVE 11/18/2016 1340   PROTEINUR NEGATIVE 11/18/2016 1340   UROBILINOGEN 0.2 07/15/2009 2249   NITRITE NEGATIVE 11/18/2016 1340   LEUKOCYTESUR NEGATIVE 11/18/2016 1340   Sepsis Labs: @LABRCNTIP (procalcitonin:4,lacticidven:4)  )No results found for this or any previous visit (from the past 240 hour(s)).       Radiology Studies: Dg Chest Port 1 View  Result Date: 01/20/2017 CLINICAL DATA:  Shortness of Breath EXAM: PORTABLE CHEST 1 VIEW COMPARISON:  November 18, 2016 FINDINGS: There is cardiomegaly with mild pulmonary venous hypertension. There is no frank edema or consolidation. There is atelectasis in the right base. No adenopathy. No bone lesions. IMPRESSION: Pulmonary vascular congestion. No frank edema or consolidation. Right base atelectasis. Electronically Signed   By: Bretta Bang III M.D.   On: 01/20/2017 14:26        Scheduled Meds: . allopurinol  100 mg Oral Q breakfast  . aspirin EC  81 mg Oral Daily  .  cholecalciferol  1,000 Units Oral Q breakfast  . docusate sodium  100 mg Oral BID  . donepezil  10 mg Oral Q breakfast  . finasteride  5 mg Oral Q breakfast  . heparin  5,000 Units Subcutaneous Q8H  . insulin aspart  0-9 Units Subcutaneous Q4H  . [START ON 01/23/2017] magnesium oxide  400 mg Oral Q M,W,F  . metoprolol tartrate  50 mg Oral BID  . sodium chloride flush  3 mL Intravenous Q12H  . terazosin  5 mg Oral Q supper  . vitamin B-12  1,000 mcg Oral Q supper   Continuous Infusions: . sodium chloride       LOS: 1 day    Time spent: 30 minutes. Greater than 50% of this  time was spent in direct contact with the patient coordinating care.     Chaya Jan, MD [facility] Pager [phone]  If 7PM-7AM, please contact night-coverage [email or web address] Password [email or web address] 01/21/2017, 1:14 PM

## 2017-01-21 NOTE — Progress Notes (Signed)
This am approximately 0830, dietary aide attempted in obtaining Pt's preference for his lunch meal.  Pt became very irate and stated, "I don't want any lunch, I'm going home, get the hell out of my room."  In questioning patient, he stated that he was not staying any longer and was leaving today with or without a MD order.  I clearly educated patient on the dangers of leaving the hospital.  He is receiving O2 [place] continuous for his compromised respiratory status while in-patient.  He is unable to rely on room air without becoming hypoxic.  I asked him if he had O2 supply at home and his answer is clearly "No".  I educated him on the dangers of leaving AMA.  I continued to explain that if he left AMA,  He would clearly decline into worsening Respiratory Failure, and without the availability of O2, he would either expire, or eventually be placed on a Ventilator if EMS was able to reach his home in time before his respiratory status was too compromised.  Wife is at bedside and has attempted to "calm' the patient to no avail.  After several minutes of educating and speaking with him, answering questions, I offered the patient AMA paperwork and he declined.  He seemed that he clearly understood and verbally answered all questions appropriately.  All questions and concerns from Patient and wife were addressed.  Wife states the [place] Neurologist diagnosed patient with the "beginning stages of Alzheimer's" about a year ago.  Dr. Ardyth Harps is aware of his earlier desire to leave [facility] AMA.  I will continue to monitor this patient throughout this shift and will continue to educate he and his wife on the importance of his course of treatment.

## 2017-01-22 LAB — BASIC METABOLIC PANEL
Anion gap: 8 (ref 5–15)
BUN: 25 mg/dL — AB (ref 6–20)
CHLORIDE: 94 mmol/L — AB (ref 101–111)
CO2: 39 mmol/L — AB (ref 22–32)
CREATININE: 2.05 mg/dL — AB (ref 0.61–1.24)
Calcium: 9.1 mg/dL (ref 8.9–10.3)
GFR calc Af Amer: 36 mL/min — ABNORMAL LOW (ref >60)
GFR calc non Af Amer: 31 mL/min — ABNORMAL LOW (ref >60)
GLUCOSE: 121 mg/dL — AB (ref 65–99)
Potassium: 4.1 mmol/L (ref 3.5–5.1)
Sodium: 141 mmol/L (ref 135–145)

## 2017-01-22 LAB — GLUCOSE, CAPILLARY
GLUCOSE-CAPILLARY: 175 mg/dL — AB (ref 65–99)
GLUCOSE-CAPILLARY: 136 mg/dL — AB (ref 65–99)
GLUCOSE-CAPILLARY: 104 mg/dL — AB (ref 65–99)
Glucose-Capillary: 113 mg/dL — ABNORMAL HIGH (ref 65–99)
Glucose-Capillary: 126 mg/dL — ABNORMAL HIGH (ref 65–99)
Glucose-Capillary: 137 mg/dL — ABNORMAL HIGH (ref 65–99)

## 2017-01-22 MED ORDER — FUROSEMIDE 10 MG/ML IJ SOLN
60.0000 mg | Freq: Three times a day (TID) | INTRAMUSCULAR | Status: DC
  Administered 2017-01-22 – 2017-01-25 (×8): 60 mg via INTRAVENOUS
  Filled 2017-01-22 (×8): qty 6

## 2017-01-22 NOTE — Progress Notes (Signed)
PROGRESS NOTE    Fernando [name]  ZOX:[id number] DOB: 1945/07/22 DOA: 01/20/2017 PCP: [facility]     Brief Narrative:  71 year old man admitted to the hospital on 8/24 after a visit to the cardiologist's office due to shortness of breath. Admission requested due to acute on chronic diastolic CHF with significant volume overload. Has experienced some sundowning.   Assessment & Plan:   Active Problems:   Elevated troponin   Acute on chronic diastolic CHF (congestive heart failure) (HCC)   Acute respiratory failure with hypoxia (HCC)   Acute hypoxemic respiratory failure -Due to acute CHF. -Continue to wean oxygen as tolerated. -See below for details.  Acute on chronic diastolic CHF  -echo in June 2018 shows an ejection fraction of 55%, however not able to properly assess diastolic function due to body habitus. -Has diuresed a total of 2.8 L but remains volume overloaded on exam. -Increase Lasix to 60 mg IV every 8 hours. -Continue metoprolol, not on ACE inhibitor due to chronic kidney disease.   chronic kidney disease stage 3-4  -Creatinine is at baseline of around 2-2.2.   Diabetes -Well controlled, continue current regimen.  Elevated troponin -Maximum elevation of 0.09, no chest pain. Likely secondary to acute CHF. No plans for further cardiac interventions at this time  Dementia with sundowning -Try to reorient, wife to stay with him tonight. -As soon as medically ready will discharge home to minimize time spent in unfamiliar environment. -Try to avoid sedating medications if possible.    DVT prophylaxis: SQ heparin Code Status: Full Code Family Communication: wife at bedside updated on plan of care and all questions answered. Disposition Plan: to be determined pending medical stability  Consultants:   None  Procedures:   None  Antimicrobials:  Anti-infectives    None       Subjective: Sitting at bedside, disappointed that he will  not go home today, less short of breath.  Objective: Vitals:   01/21/17 2124 01/22/17 0349 01/22/17 0426 01/22/17 1439  BP: (!) 116/53  99/63 121/61  Pulse: 62  68 69  Resp: 20  20 20   Temp: 98.4 F (36.9 C)  99.2 F (37.3 C) 98.9 F (37.2 C)  TempSrc: Oral  Oral Oral  SpO2: 95%  95% 95%  Weight:  (!) 139.9 kg (308 lb 8 oz)    Height:        Intake/Output Summary (Last 24 hours) at 01/22/17 1641 Last data filed at 01/21/17 2307  Gross per 24 hour  Intake              240 ml  Output             1150 ml  Net             -910 ml   Filed Weights   01/20/17 1334 01/21/17 0510 01/22/17 0349  Weight: (!) 145.2 kg (320 lb) (!) 144.8 kg (319 lb 3.6 oz) (!) 139.9 kg (308 lb 8 oz)    Examination:  General exam: Alert, awake, oriented x 3,Morbidly obese Respiratory system: Bilateral crackles, improved from yesterday Cardiovascular system:RRR. No murmurs, rubs, gallops. Gastrointestinal system: Abdomen is nondistended, soft and nontender. No organomegaly or masses felt. Normal bowel sounds heard. Central nervous system: Alert and oriented. No focal neurological deficits. Extremities: 2+ pitting edema bilaterally Skin: No rashes, lesions or ulcers Psychiatry: Judgement and insight appear normal. Mood & affect appropriate.       Data Reviewed: I have personally reviewed  following labs and imaging studies  CBC:  Recent Labs Lab 01/20/17 1410 01/21/17 0411  WBC 6.9 6.8  NEUTROABS 4.8  --   HGB 10.4* 10.4*  HCT 36.1* 36.1*  MCV 87.8 89.8  PLT 225 275   Basic Metabolic Panel:  Recent Labs Lab 01/16/17 1257 01/20/17 1410 01/21/17 0411 01/22/17 0621  NA 141 144 145 141  K 3.7 4.0 4.1 4.1  CL 97* 98* 97* 94*  CO2 35* 38* 42* 39*  GLUCOSE 93 84 146* 121*  BUN 29* 26* 24* 25*  CREATININE 2.08* 2.10* 2.03* 2.05*  CALCIUM 8.6* 9.0 9.0 9.1   GFR: Estimated Creatinine Clearance: 47.2 mL/min (A) (by C-G formula based on SCr of 2.05 mg/dL (H)). Liver Function  Tests:  Recent Labs Lab 01/20/17 1410 01/21/17 0411  AST 20 12*  ALT 10* 9*  ALKPHOS 43 41  BILITOT 0.2* 0.3  PROT 6.6 6.3*  ALBUMIN 3.1* 3.0*   No results for input(s): LIPASE, AMYLASE in the last 168 hours. No results for input(s): AMMONIA in the last 168 hours. Coagulation Profile: No results for input(s): INR, PROTIME in the last 168 hours. Cardiac Enzymes:  Recent Labs Lab 01/20/17 1410 01/20/17 1725 01/20/17 2121 01/21/17 0411  TROPONINI 0.08* 0.09* 0.08* 0.09*   BNP (last 3 results) No results for input(s): PROBNP in the last 8760 hours. HbA1C:  Recent Labs  01/20/17 2121  HGBA1C 6.4*   CBG:  Recent Labs Lab 01/22/17 0012 01/22/17 0357 01/22/17 0925 01/22/17 1125 01/22/17 1620  GLUCAP 137* 126* 175* 136* 104*   Lipid Profile: No results for input(s): CHOL, HDL, LDLCALC, TRIG, CHOLHDL, LDLDIRECT in the last 72 hours. Thyroid Function Tests: No results for input(s): TSH, T4TOTAL, FREET4, T3FREE, THYROIDAB in the last 72 hours. Anemia Panel: No results for input(s): VITAMINB12, FOLATE, FERRITIN, TIBC, IRON, RETICCTPCT in the last 72 hours. Urine analysis:    Component Value Date/Time   COLORURINE STRAW (A) 11/18/2016 1340   APPEARANCEUR CLEAR 11/18/2016 1340   LABSPEC 1.005 11/18/2016 1340   PHURINE 6.0 11/18/2016 1340   GLUCOSEU NEGATIVE 11/18/2016 1340   HGBUR NEGATIVE 11/18/2016 1340   BILIRUBINUR NEGATIVE 11/18/2016 1340   KETONESUR NEGATIVE 11/18/2016 1340   PROTEINUR NEGATIVE 11/18/2016 1340   UROBILINOGEN 0.2 07/15/2009 2249   NITRITE NEGATIVE 11/18/2016 1340   LEUKOCYTESUR NEGATIVE 11/18/2016 1340   Sepsis Labs: @LABRCNTIP (procalcitonin:4,lacticidven:4)  )No results found for this or any previous visit (from the past 240 hour(s)).       Radiology Studies: No results found.      Scheduled Meds: . allopurinol  100 mg Oral Q breakfast  . aspirin EC  81 mg Oral Daily  . cholecalciferol  1,000 Units Oral Q breakfast  .  docusate sodium  100 mg Oral BID  . donepezil  10 mg Oral Q breakfast  . finasteride  5 mg Oral Q breakfast  . furosemide  60 mg Intravenous BID  . heparin  5,000 Units Subcutaneous Q8H  . insulin aspart  0-9 Units Subcutaneous Q4H  . [START ON 01/23/2017] magnesium oxide  400 mg Oral Q M,W,F  . metoprolol tartrate  50 mg Oral BID  . sodium chloride flush  3 mL Intravenous Q12H  . terazosin  5 mg Oral Q supper  . vitamin B-12  1,000 mcg Oral Q supper   Continuous Infusions: . sodium chloride       LOS: 2 days    Time spent: 30 minutes. Greater than 50% of this time was spent  in direct contact with the patient coordinating care.     Chaya Jan, MD [facility] Pager [phone]  If 7PM-7AM, please contact night-coverage [email or web address] Password [facility] 01/22/2017, 4:41 PM

## 2017-01-23 ENCOUNTER — Encounter (HOSPITAL_COMMUNITY): Payer: Self-pay

## 2017-01-23 ENCOUNTER — Ambulatory Visit (HOSPITAL_COMMUNITY)
Admission: RE | Admit: 2017-01-23 | Discharge: 2017-01-23 | Disposition: A | Discharge status: Home / Self Care | Payer: Medicare Other | Source: Ambulatory Visit | Attending: Nephrology | Admitting: Nephrology

## 2017-01-23 DIAGNOSIS — I5041 Acute combined systolic (congestive) and diastolic (congestive) heart failure: Secondary | ICD-10-CM

## 2017-01-23 LAB — BASIC METABOLIC PANEL
Anion gap: 10 (ref 5–15)
BUN: 30 mg/dL — AB (ref 6–20)
CALCIUM: 8.9 mg/dL (ref 8.9–10.3)
CHLORIDE: 89 mmol/L — AB (ref 101–111)
CO2: 39 mmol/L — AB (ref 22–32)
CREATININE: 1.98 mg/dL — AB (ref 0.61–1.24)
GFR calc Af Amer: 37 mL/min — ABNORMAL LOW (ref >60)
GFR calc non Af Amer: 32 mL/min — ABNORMAL LOW (ref >60)
GLUCOSE: 108 mg/dL — AB (ref 65–99)
Potassium: 3.9 mmol/L (ref 3.5–5.1)
Sodium: 138 mmol/L (ref 135–145)

## 2017-01-23 LAB — GLUCOSE, CAPILLARY
GLUCOSE-CAPILLARY: 106 mg/dL — AB (ref 65–99)
GLUCOSE-CAPILLARY: 149 mg/dL — AB (ref 65–99)
GLUCOSE-CAPILLARY: 117 mg/dL — AB (ref 65–99)
Glucose-Capillary: 110 mg/dL — ABNORMAL HIGH (ref 65–99)
Glucose-Capillary: 119 mg/dL — ABNORMAL HIGH (ref 65–99)
Glucose-Capillary: 146 mg/dL — ABNORMAL HIGH (ref 65–99)

## 2017-01-23 MED ORDER — INSULIN ASPART 100 UNIT/ML ~~LOC~~ SOLN
0.0000 [IU] | Freq: Three times a day (TID) | SUBCUTANEOUS | Status: DC
  Administered 2017-01-24: 2 [IU] via SUBCUTANEOUS
  Administered 2017-01-25: 1 [IU] via SUBCUTANEOUS
  Administered 2017-01-26: 2 [IU] via SUBCUTANEOUS
  Administered 2017-01-26: 1 [IU] via SUBCUTANEOUS

## 2017-01-23 NOTE — Care Management Note (Signed)
Case Management Note  Patient Details  Name: ALTO MESMER MRN: [id number] Date of Birth: 07-20-1945  Subjective/Objective:                  Adm with acute respiratory failure with hypoxia. From home with wife, ind PTA. Has RW and cane if needed. Patient attempting weaning to room air today. Has had HH in the past, declines HH services today.   Action/Plan: Anticipate DC home. CM following. Patient anticipated DC home today.    Expected Discharge Date:       01/21/2017           Expected Discharge Plan:  Home/Self Care  In-House Referral:     Discharge planning Services  CM Consult  Post Acute Care Choice:  NA Choice offered to:  NA  DME Arranged:    DME Agency:     HH Arranged:    HH Agency:     Status of Service:  Completed, signed off  If discussed at [employer] of Stay Meetings, dates discussed:    Additional Comments:  Aariv Medlock, Chrystine Oiler, RN 01/23/2017, 2:15 PM

## 2017-01-23 NOTE — Progress Notes (Signed)
Dr. Clearence Ped paged and made aware the the hospitalist ordered [facility]chs accu checks, but ordered the Q4H SSI coverage. RN spoke with pharmacy and was advised the MD has to go in and change the order. Dr. Clearence Ped was paged to ask her to change the SSI to [facility].  Waiting for orders.

## 2017-01-23 NOTE — Progress Notes (Signed)
Pts oxygen increased to 3L per RT. Will continue to monitor pt

## 2017-01-23 NOTE — Progress Notes (Signed)
PROGRESS NOTE    Fernando [name]  ZOX:[id number] DOB: [birth year] DOA: 01/20/2017 [facility]CP: [facility]     Brief Narrative:  71 year old man admitted to the hospital on 8/24 after a visit to the cardiologist's office due to shortness of breath. Admission requested due to acute on chronic diastolic CHF with significant volume overload. Has experienced some sundowning.   Assessment & Plan:   Active Problems:   Elevated troponin   Acute on chronic diastolic CHF (congestive heart failure) (HCC)   Acute respiratory failure with hypoxia (HCC)   Acute hypoxemic respiratory failure -Due to acute CHF. -Continue to wean oxygen as tolerated. -See below for details.  Acute on chronic diastolic CHF  -echo in June 2018 shows an ejection fraction of 55%, however not able to properly assess diastolic function due to body habitus. -Intake and output has not been adequately recorded due to difficulties with condom catheter overnight. -Lasix was increased to 60 mg IV every 8 hours yesterday and patient has better volume status today, however remains hypervolemic on exam.. -Continue metoprolol, not on ACE inhibitor due to chronic kidney disease.   chronic kidney disease stage 3-4  -Creatinine is at baseline of around 2-2.2.   Diabetes -Well controlled, continue current regimen.  Elevated troponin -Maximum elevation of 0.09, no chest pain. Likely secondary to acute CHF. No plans for further cardiac interventions at this time  Dementia with sundowning -Try to reorient, wife to stay with him tonight. -As soon as medically ready will discharge home to minimize time spent in unfamiliar environment. -Try to avoid sedating medications if possible.    DVT prophylaxis: SQ heparin Code Status: Full Code Family Communication: wife at bedside updated on plan of care and all questions answered. Disposition Plan: to be determined pending medical stability  Consultants:    None  Procedures:   None  Antimicrobials:  Anti-infectives    None       Subjective: Sitting in bedside chair, is currently not wearing oxygen, states he feels markedly improved. No chest pain.  Objective: Vitals:   01/22/17 1957 01/22/17 2051 01/23/17 0539 01/23/17 1539  BP:  (!) 121/59 (!) 120/58 (!) 127/58  Pulse:  69 70 63  Resp:  20 20 20   Temp:  98.8 F (37.1 C) 98.7 F (37.1 C)   TempSrc:  Oral Oral   SpO2: (!) 81% 99% 98% 90%  Weight:   (!) 140.5 kg (309 lb 11.9 oz)   Height:        Intake/Output Summary (Last 24 hours) at 01/23/17 1836 Last data filed at 01/23/17 1236  Gross per 24 hour  Intake              480 ml  Output                0 ml  Net              480 ml   Filed Weights   01/21/17 0510 01/22/17 0349 01/23/17 0539  Weight: (!) 144.8 kg (319 lb 3.6 oz) (!) 139.9 kg (308 lb 8 oz) (!) 140.5 kg (309 lb 11.9 oz)    Examination:  General exam: Alert, awake, oriented x 3,Morbidly obese Respiratory system: Clear to auscultation. Respiratory effort normal. Cardiovascular system:RRR. No murmurs, rubs, gallops. Gastrointestinal system: Abdomen is nondistended, soft and nontender. No organomegaly or masses felt. Normal bowel sounds heard. Central nervous system: Alert and oriented. No focal neurological deficits. Extremities: 2-3+ pitting edema bilaterally Skin: No rashes,  lesions or ulcers Psychiatry: Judgement and insight appear normal. Mood & affect appropriate.        Data Reviewed: I have personally reviewed following labs and imaging studies  CBC:  Recent Labs Lab 01/20/17 1410 01/21/17 0411  WBC 6.9 6.8  NEUTROABS 4.8  --   HGB 10.4* 10.4*  HCT 36.1* 36.1*  MCV 87.8 89.8  PLT 225 275   Basic Metabolic Panel:  Recent Labs Lab 01/20/17 1410 01/21/17 0411 01/22/17 0621 01/23/17 0450  NA 144 145 141 138  K 4.0 4.1 4.1 3.9  CL 98* 97* 94* 89*  CO2 38* 42* 39* 39*  GLUCOSE 84 146* 121* 108*  BUN 26* 24* 25* 30*   CREATININE 2.10* 2.03* 2.05* 1.98*  CALCIUM 9.0 9.0 9.1 8.9   GFR: Estimated Creatinine Clearance: 49 mL/min (A) (by C-G formula based on SCr of 1.98 mg/dL (H)). Liver Function Tests:  Recent Labs Lab 01/20/17 1410 01/21/17 0411  AST 20 12*  ALT 10* 9*  ALKPHOS 43 41  BILITOT 0.2* 0.3  PROT 6.6 6.3*  ALBUMIN 3.1* 3.0*   No results for input(s): LIPASE, AMYLASE in the last 168 hours. No results for input(s): AMMONIA in the last 168 hours. Coagulation Profile: No results for input(s): INR, PROTIME in the last 168 hours. Cardiac Enzymes:  Recent Labs Lab 01/20/17 1410 01/20/17 1725 01/20/17 2121 01/21/17 0411  TROPONINI 0.08* 0.09* 0.08* 0.09*   BNP (last 3 results) No results for input(s): PROBNP in the last 8760 hours. HbA1C:  Recent Labs  01/20/17 2121  HGBA1C 6.4*   CBG:  Recent Labs Lab 01/23/17 0033 01/23/17 0429 01/23/17 0721 01/23/17 1130 01/23/17 1710  GLUCAP 119* 110* 106* 149* 117*   Lipid Profile: No results for input(s): CHOL, HDL, LDLCALC, TRIG, CHOLHDL, LDLDIRECT in the last 72 hours. Thyroid Function Tests: No results for input(s): TSH, T4TOTAL, FREET4, T3FREE, THYROIDAB in the last 72 hours. Anemia Panel: No results for input(s): VITAMINB12, FOLATE, FERRITIN, TIBC, IRON, RETICCTPCT in the last 72 hours. Urine analysis:    Component Value Date/Time   COLORURINE STRAW (A) 11/18/2016 1340   APPEARANCEUR CLEAR 11/18/2016 1340   LABSPEC 1.005 11/18/2016 1340   PHURINE 6.0 11/18/2016 1340   GLUCOSEU NEGATIVE 11/18/2016 1340   HGBUR NEGATIVE 11/18/2016 1340   BILIRUBINUR NEGATIVE 11/18/2016 1340   KETONESUR NEGATIVE 11/18/2016 1340   PROTEINUR NEGATIVE 11/18/2016 1340   UROBILINOGEN 0.2 07/15/2009 2249   NITRITE NEGATIVE 11/18/2016 1340   LEUKOCYTESUR NEGATIVE 11/18/2016 1340   Sepsis Labs: @LABRCNTIP (procalcitonin:4,lacticidven:4)  )No results found for this or any previous visit (from the past 240 hour(s)).       Radiology  Studies: No results found.      Scheduled Meds: . allopurinol  100 mg Oral Q breakfast  . aspirin EC  81 mg Oral Daily  . cholecalciferol  1,000 Units Oral Q breakfast  . docusate sodium  100 mg Oral BID  . donepezil  10 mg Oral Q breakfast  . finasteride  5 mg Oral Q breakfast  . furosemide  60 mg Intravenous Q8H  . heparin  5,000 Units Subcutaneous Q8H  . insulin aspart  0-9 Units Subcutaneous Q4H  . magnesium oxide  400 mg Oral Q M,W,F  . metoprolol tartrate  50 mg Oral BID  . sodium chloride flush  3 mL Intravenous Q12H  . terazosin  5 mg Oral Q supper  . vitamin B-12  1,000 mcg Oral Q supper   Continuous Infusions: . sodium chloride  LOS: 3 days    Time spent: 30 minutes. Greater than 50% of this time was spent in direct contact with the patient coordinating care.     Chaya Jan, MD [facility] Pager [phone]  If 7PM-7AM, please contact night-coverage [email or web address] Password [facility] 01/23/2017, 6:36 PM

## 2017-01-23 NOTE — Progress Notes (Signed)
Pts O2 sat on RA=90%, pt placed on 1L O2 per [place]. Will continue to monitor pt.

## 2017-01-24 ENCOUNTER — Inpatient Hospital Stay (HOSPITAL_COMMUNITY): Payer: Medicare Other

## 2017-01-24 LAB — GLUCOSE, CAPILLARY
GLUCOSE-CAPILLARY: 115 mg/dL — AB (ref 65–99)
Glucose-Capillary: 105 mg/dL — ABNORMAL HIGH (ref 65–99)
Glucose-Capillary: 161 mg/dL — ABNORMAL HIGH (ref 65–99)
Glucose-Capillary: 155 mg/dL — ABNORMAL HIGH (ref 65–99)

## 2017-01-24 LAB — BASIC METABOLIC PANEL
Anion gap: 9 (ref 5–15)
BUN: 35 mg/dL — ABNORMAL HIGH (ref 6–20)
CALCIUM: 9 mg/dL (ref 8.9–10.3)
CHLORIDE: 92 mmol/L — AB (ref 101–111)
CO2: 39 mmol/L — AB (ref 22–32)
CREATININE: 2.07 mg/dL — AB (ref 0.61–1.24)
GFR calc non Af Amer: 30 mL/min — ABNORMAL LOW (ref >60)
GFR, EST AFRICAN AMERICAN: 35 mL/min — AB (ref >60)
GLUCOSE: 113 mg/dL — AB (ref 65–99)
Potassium: 3.8 mmol/L (ref 3.5–5.1)
Sodium: 140 mmol/L (ref 135–145)

## 2017-01-24 NOTE — Progress Notes (Signed)
Pt walked out of room after ripping telemetry [place] behind pt. Pt re-oriented that he is at the hospital and RN and NT walked him back in room. RN educated pt on POC, where he is and time of night. Telemetry placed back on pt. Will continue to monitor pt

## 2017-01-24 NOTE — Progress Notes (Signed)
PROGRESS NOTE    Fernando [name]  ZOX:[id number] DOB: 05/26/1946 DOA: 01/20/2017 [facility]CP: [facility]     Brief Narrative:  71 year old man admitted to the hospital on 8/24 after a visit to the cardiologist's office due to shortness of breath. Admission requested due to acute on chronic diastolic CHF with significant volume overload. Has experienced some sundowning.   Assessment & Plan:   Active Problems:   Elevated troponin   Acute on chronic diastolic CHF (congestive heart failure) (HCC)   Acute respiratory failure with hypoxia (HCC)   Acute hypoxemic respiratory failure -Due to acute CHF. -Continue to wean oxygen as tolerated. Overnight 8/27 has developed increasing oxygen requirements. Chest x-ray has been obtained that shows bibasilar atelectasis, no signs of fluid overload. Continue oxygen, incentive spirometry. -See below for details.  Acute on chronic diastolic CHF  -echo in June 2018 shows an ejection fraction of 55%, however not able to properly assess diastolic function due to body habitus. -Intake and output has not been adequately recorded due to difficulties with condom catheter and incontinence. -Lasix has been increased to 60 mg IV every 8 hours and patient has better volume status today, however remains hypervolemic on exam.. Continue 3 times a day Lasix for another 24 hours and reassess. -Continue metoprolol, not on ACE inhibitor due to chronic kidney disease.   chronic kidney disease stage 3-4  -Creatinine is at baseline of around 2-2.2.   Diabetes -Well controlled, continue current regimen.  Elevated troponin -Maximum elevation of 0.09, no chest pain. Likely secondary to acute CHF. No plans for further cardiac interventions at this time  Dementia with sundowning -Try to reorient, wife to stay with him tonight. -As soon as medically ready will discharge home to minimize time spent in unfamiliar environment. -Try to avoid sedating medications  if possible.    DVT prophylaxis: SQ heparin Code Status: Full Code Family Communication: wife at bedside updated on plan of care and all questions answered. Disposition Plan: to be determined pending medical stability  Consultants:   None  Procedures:   None  Antimicrobials:  Anti-infectives    None       Subjective: Sitting in chair at bedside, increased respiratory effort, complains of a little increase in shortness of breath today.  Objective: Vitals:   01/24/17 0500 01/24/17 0603 01/24/17 1250 01/24/17 1257  BP:  121/64  (!) 113/53  Pulse:  (!) 57  68  Resp:  16  16  Temp:  98.3 F (36.8 C)  98.8 F (37.1 C)  TempSrc:  Oral  Oral  SpO2:  97% (!) 86% 92%  Weight: (!) 138.8 kg (306 lb)     Height:        Intake/Output Summary (Last 24 hours) at 01/24/17 1550 Last data filed at 01/24/17 1200  Gross per 24 hour  Intake              920 ml  Output             1749 ml  Net             -829 ml   Filed Weights   01/22/17 0349 01/23/17 0539 01/24/17 0500  Weight: (!) 139.9 kg (308 lb 8 oz) (!) 140.5 kg (309 lb 11.9 oz) (!) 138.8 kg (306 lb)    Examination:  General exam: Alert, awake, oriented x 3,Morbidly obese Respiratory system: Bibasilar crackles Cardiovascular system:RRR. No murmurs, rubs, gallops. Gastrointestinal system: Abdomen is nondistended, soft and nontender. No  organomegaly or masses felt. Normal bowel sounds heard. Central nervous system: Alert and oriented. No focal neurological deficits. Extremities: 2-3+ pitting edema bilaterally Skin: No rashes, lesions or ulcers Psychiatry: Judgement and insight appear normal. Mood & affect appropriate.        Data Reviewed: I have personally reviewed following labs and imaging studies  CBC:  Recent Labs Lab 01/20/17 1410 01/21/17 0411  WBC 6.9 6.8  NEUTROABS 4.8  --   HGB 10.4* 10.4*  HCT 36.1* 36.1*  MCV 87.8 89.8  PLT 225 275   Basic Metabolic Panel:  Recent Labs Lab  01/20/17 1410 01/21/17 0411 01/22/17 0621 01/23/17 0450 01/24/17 0648  NA 144 145 141 138 140  K 4.0 4.1 4.1 3.9 3.8  CL 98* 97* 94* 89* 92*  CO2 38* 42* 39* 39* 39*  GLUCOSE 84 146* 121* 108* 113*  BUN 26* 24* 25* 30* 35*  CREATININE 2.10* 2.03* 2.05* 1.98* 2.07*  CALCIUM 9.0 9.0 9.1 8.9 9.0   GFR: Estimated Creatinine Clearance: 46.6 mL/min (A) (by C-G formula based on SCr of 2.07 mg/dL (H)). Liver Function Tests:  Recent Labs Lab 01/20/17 1410 01/21/17 0411  AST 20 12*  ALT 10* 9*  ALKPHOS 43 41  BILITOT 0.2* 0.3  PROT 6.6 6.3*  ALBUMIN 3.1* 3.0*   No results for input(s): LIPASE, AMYLASE in the last 168 hours. No results for input(s): AMMONIA in the last 168 hours. Coagulation Profile: No results for input(s): INR, PROTIME in the last 168 hours. Cardiac Enzymes:  Recent Labs Lab 01/20/17 1410 01/20/17 1725 01/20/17 2121 01/21/17 0411  TROPONINI 0.08* 0.09* 0.08* 0.09*   BNP (last 3 results) No results for input(s): PROBNP in the last 8760 hours. HbA1C: No results for input(s): HGBA1C in the last 72 hours. CBG:  Recent Labs Lab 01/23/17 1130 01/23/17 1710 01/23/17 2017 01/24/17 0724 01/24/17 1119  GLUCAP 149* 117* 146* 105* 161*   Lipid Profile: No results for input(s): CHOL, HDL, LDLCALC, TRIG, CHOLHDL, LDLDIRECT in the last 72 hours. Thyroid Function Tests: No results for input(s): TSH, T4TOTAL, FREET4, T3FREE, THYROIDAB in the last 72 hours. Anemia Panel: No results for input(s): VITAMINB12, FOLATE, FERRITIN, TIBC, IRON, RETICCTPCT in the last 72 hours. Urine analysis:    Component Value Date/Time   COLORURINE STRAW (A) 11/18/2016 1340   APPEARANCEUR CLEAR 11/18/2016 1340   LABSPEC 1.005 11/18/2016 1340   PHURINE 6.0 11/18/2016 1340   GLUCOSEU NEGATIVE 11/18/2016 1340   HGBUR NEGATIVE 11/18/2016 1340   BILIRUBINUR NEGATIVE 11/18/2016 1340   KETONESUR NEGATIVE 11/18/2016 1340   PROTEINUR NEGATIVE 11/18/2016 1340   UROBILINOGEN 0.2  07/15/2009 2249   NITRITE NEGATIVE 11/18/2016 1340   LEUKOCYTESUR NEGATIVE 11/18/2016 1340   Sepsis Labs: @LABRCNTIP (procalcitonin:4,lacticidven:4)  )No results found for this or any previous visit (from the past 240 hour(s)).       Radiology Studies: Dg Chest 2 View  Result Date: 01/24/2017 CLINICAL DATA:  Acute respiratory failure with hypoxemia EXAM: CHEST  2 VIEW COMPARISON:  01/20/2017 FINDINGS: Chronic cardiomegaly. Stable aortic tortuosity. Negative hila. Mild atelectasis or scarring on the left more than right. There is no edema, consolidation, effusion, or pneumothorax. No acute osseous finding. IMPRESSION: 1. Mild, left more than right atelectasis. 2. Chronic cardiomegaly. Electronically Signed   By: Marnee Spring M.D.   On: 01/24/2017 13:42        Scheduled Meds: . allopurinol  100 mg Oral Q breakfast  . aspirin EC  81 mg Oral Daily  . cholecalciferol  1,000 Units Oral Q breakfast  . docusate sodium  100 mg Oral BID  . donepezil  10 mg Oral Q breakfast  . finasteride  5 mg Oral Q breakfast  . furosemide  60 mg Intravenous Q8H  . heparin  5,000 Units Subcutaneous Q8H  . insulin aspart  0-9 Units Subcutaneous TID WC  . magnesium oxide  400 mg Oral Q M,W,F  . metoprolol tartrate  50 mg Oral BID  . sodium chloride flush  3 mL Intravenous Q12H  . terazosin  5 mg Oral Q supper  . vitamin B-12  1,000 mcg Oral Q supper   Continuous Infusions: . sodium chloride       LOS: 4 days    Time spent: 30 minutes. Greater than 50% of this time was spent in direct contact with the patient coordinating care.     Chaya Jan, MD [facility] Pager [phone]  If 7PM-7AM, please contact night-coverage [email or web address] Password [email or web address] 01/24/2017, 3:50 PM

## 2017-01-24 NOTE — Progress Notes (Signed)
Wife stated that after pt received Lasix, pt has urinated on floor x3 and she has cleaned it up. RN asked wife if pt could not use urinal d/t [place] needing to keep track of I&Os and she stated "He's not waking up, he's just peeing."

## 2017-01-25 LAB — BASIC METABOLIC PANEL
ANION GAP: 10 (ref 5–15)
BUN: 37 mg/dL — ABNORMAL HIGH (ref 6–20)
CHLORIDE: 88 mmol/L — AB (ref 101–111)
CO2: 41 mmol/L — AB (ref 22–32)
CREATININE: 2.41 mg/dL — AB (ref 0.61–1.24)
Calcium: 9.1 mg/dL (ref 8.9–10.3)
GFR calc non Af Amer: 25 mL/min — ABNORMAL LOW (ref >60)
GFR, EST AFRICAN AMERICAN: 29 mL/min — AB (ref >60)
Glucose, Bld: 123 mg/dL — ABNORMAL HIGH (ref 65–99)
Potassium: 3.8 mmol/L (ref 3.5–5.1)
Sodium: 139 mmol/L (ref 135–145)

## 2017-01-25 LAB — GLUCOSE, CAPILLARY
Glucose-Capillary: 115 mg/dL — ABNORMAL HIGH (ref 65–99)
Glucose-Capillary: 149 mg/dL — ABNORMAL HIGH (ref 65–99)
Glucose-Capillary: 154 mg/dL — ABNORMAL HIGH (ref 65–99)
Glucose-Capillary: 186 mg/dL — ABNORMAL HIGH (ref 65–99)

## 2017-01-25 MED ORDER — FUROSEMIDE 10 MG/ML IJ SOLN
8.0000 mg/h | INTRAVENOUS | Status: DC
  Administered 2017-01-25: 8 mg/h via INTRAVENOUS
  Filled 2017-01-25 (×2): qty 25

## 2017-01-25 NOTE — Plan of Care (Signed)
Problem: Safety: Goal: Ability to remain free from injury will improve Outcome: Adequate for Discharge Pt on High fall risk d/t medications and hx of one fall in the past 6 months. Pt up with stand-by assist to BR and in room. Pt educated on safety measures and interventions placed to help prevent falls. Pt verbalized understanding. Will continue to monitor pt

## 2017-01-25 NOTE — Progress Notes (Signed)
Pts pulse ox taken on room air and it was 87%. Pt placed back on 3L O2 per Gordon. Pulse ox now 98%. Will continue to monitor pt

## 2017-01-25 NOTE — Progress Notes (Signed)
PROGRESS NOTE    Fernando [name]  ZHG:[id number] DOB: 12/20/1945 DOA: 01/20/2017 PCP: [facility]    Brief Narrative:  71 year old man admitted to the hospital on 8/24 after a visit to the cardiologist's office due to shortness of breath. Admission requested due to acute on chronic diastolic CHF with significant volume overload. Has experienced some sundowning.  Family has been very anxious to be discharged, but he is still clinically volume overloaded.  Cr is 2.4 at this time.    Assessment & Plan:   Active Problems:   Elevated troponin   Acute on chronic diastolic CHF (congestive heart failure) (HCC)   Acute respiratory failure with hypoxia (HCC)  Acute hypoxemic respiratory failure   Due to acute on chronic diastolic CHF.  EF   55%.  Acute on chronic diastolic CHF  -echo in June 2018 shows an ejection fraction of 55%, however not able to properly assess diastolic function due to body habitus. -Intake and output has not been adequately recorded due to difficulties with condom catheter and incontinence. -He is still volume overloaded.  Will start 8mg /hr Lasix drip.    chronic kidney disease stage 3-4  -Creatinine is at baseline of around 2-2.2.   -May have to compromise GFR a little to get him out of CHF.   Diabetes -Well controlled, continue current regimen.  Elevated troponin -Maximum elevation of 0.09, no chest pain. Likely secondary to acute CHF. No plans for further cardiac interventions at this time  Dementia with sundowning -Try to reorient, wife to stay with him tonight. -As soon as medically ready will discharge home to minimize time spent in unfamiliar environment. -Try to avoid sedating medications if possible.    DVT prophylaxis: SQ heparin Code Status: Full Code Family Communication: wife at bedside updated on plan of care and all questions answered. Disposition Plan: to be determined pending medical  stability    Antimicrobials: Anti-infectives    None       Subjective:   Breathing a little better.   Wants to go home.   Objective: Vitals:   01/24/17 1257 01/24/17 2111 01/24/17 2132 01/25/17 0604  BP: (!) 113/53  (!) 106/56 134/63  Pulse: 68  74 80  Resp: 16  20 17   Temp: 98.8 F (37.1 C)  98.9 F (37.2 C) 98.2 F (36.8 C)  TempSrc: Oral  Oral Oral  SpO2: 92% 93% 95% 96%  Weight:    (!) 137.2 kg (302 lb 6.4 oz)  Height:        Intake/Output Summary (Last 24 hours) at 01/25/17 1118 Last data filed at 01/25/17 0910  Gross per 24 hour  Intake              820 ml  Output             2700 ml  Net            -1880 ml   Filed Weights   01/23/17 0539 01/24/17 0500 01/25/17 0604  Weight: (!) 140.5 kg (309 lb 11.9 oz) (!) 138.8 kg (306 lb) (!) 137.2 kg (302 lb 6.4 oz)    Examination:  General exam: Appears calm and comfortable  Respiratory system: Clear to auscultation. Respiratory effort normal. Cardiovascular system: S1 & S2 heard, RRR. No JVD, murmurs, rubs, gallops or clicks. No pedal edema. Gastrointestinal system: Abdomen is nondistended, soft and nontender. No organomegaly or masses felt. Normal bowel sounds heard. Central nervous system: Alert and oriented. No focal neurological deficits. Extremities:  Symmetric 5 x 5 power. Skin: No rashes, lesions or ulcers Psychiatry: Judgement and insight appear normal. Mood & affect appropriate.   Data Reviewed: I have personally reviewed following labs and imaging studies  CBC:  Recent Labs Lab 01/20/17 1410 01/21/17 0411  WBC 6.9 6.8  NEUTROABS 4.8  --   HGB 10.4* 10.4*  HCT 36.1* 36.1*  MCV 87.8 89.8  PLT 225 275   Basic Metabolic Panel:  Recent Labs Lab 01/21/17 0411 01/22/17 0621 01/23/17 0450 01/24/17 0648 01/25/17 0613  NA 145 141 138 140 139  K 4.1 4.1 3.9 3.8 3.8  CL 97* 94* 89* 92* 88*  CO2 42* 39* 39* 39* 41*  GLUCOSE 146* 121* 108* 113* 123*  BUN 24* 25* 30* 35* 37*  CREATININE 2.03*  2.05* 1.98* 2.07* 2.41*  CALCIUM 9.0 9.1 8.9 9.0 9.1   GFR: Estimated Creatinine Clearance: 39.7 mL/min (A) (by C-G formula based on SCr of 2.41 mg/dL (H)). Liver Function Tests:  Recent Labs Lab 01/20/17 1410 01/21/17 0411  AST 20 12*  ALT 10* 9*  ALKPHOS 43 41  BILITOT 0.2* 0.3  PROT 6.6 6.3*  ALBUMIN 3.1* 3.0*   Cardiac Enzymes:  Recent Labs Lab 01/20/17 1410 01/20/17 1725 01/20/17 2121 01/21/17 0411  TROPONINI 0.08* 0.09* 0.08* 0.09*   CBG:  Recent Labs Lab 01/24/17 1119 01/24/17 1616 01/24/17 2136 01/25/17 0721 01/25/17 1102  GLUCAP 161* 115* 155* 115* 149*    Lipid Profile:  Radiology Studies: Dg Chest 2 View  Result Date: 01/24/2017 CLINICAL DATA:  Acute respiratory failure with hypoxemia EXAM: CHEST  2 VIEW COMPARISON:  01/20/2017 FINDINGS: Chronic cardiomegaly. Stable aortic tortuosity. Negative hila. Mild atelectasis or scarring on the left more than right. There is no edema, consolidation, effusion, or pneumothorax. No acute osseous finding. IMPRESSION: 1. Mild, left more than right atelectasis. 2. Chronic cardiomegaly. Electronically Signed   By: Marnee Spring M.D.   On: 01/24/2017 13:42    Scheduled Meds: . allopurinol  100 mg Oral Q breakfast  . aspirin EC  81 mg Oral Daily  . cholecalciferol  1,000 Units Oral Q breakfast  . docusate sodium  100 mg Oral BID  . donepezil  10 mg Oral Q breakfast  . finasteride  5 mg Oral Q breakfast  . heparin  5,000 Units Subcutaneous Q8H  . insulin aspart  0-9 Units Subcutaneous TID WC  . magnesium oxide  400 mg Oral Q M,W,F  . metoprolol tartrate  50 mg Oral BID  . sodium chloride flush  3 mL Intravenous Q12H  . terazosin  5 mg Oral Q supper  . vitamin B-12  1,000 mcg Oral Q supper   Continuous Infusions: . sodium chloride    . furosemide (LASIX) infusion       LOS: 5 days   Joleene Burnham, MD FACP Hospitalist.   If 7PM-7AM, please contact night-coverage [email or web address] Password [email or web address] 01/25/2017,  11:18 AM

## 2017-01-26 LAB — GLUCOSE, CAPILLARY
Glucose-Capillary: 127 mg/dL — ABNORMAL HIGH (ref 65–99)
Glucose-Capillary: 172 mg/dL — ABNORMAL HIGH (ref 65–99)

## 2017-01-26 LAB — BASIC METABOLIC PANEL
ANION GAP: 11 (ref 5–15)
BUN: 38 mg/dL — ABNORMAL HIGH (ref 6–20)
CALCIUM: 9 mg/dL (ref 8.9–10.3)
CO2: 38 mmol/L — ABNORMAL HIGH (ref 22–32)
Chloride: 89 mmol/L — ABNORMAL LOW (ref 101–111)
Creatinine, Ser: 2.18 mg/dL — ABNORMAL HIGH (ref 0.61–1.24)
GFR, EST AFRICAN AMERICAN: 33 mL/min — AB (ref >60)
GFR, EST NON AFRICAN AMERICAN: 28 mL/min — AB (ref >60)
Glucose, Bld: 131 mg/dL — ABNORMAL HIGH (ref 65–99)
Potassium: 3.8 mmol/L (ref 3.5–5.1)
SODIUM: 138 mmol/L (ref 135–145)

## 2017-01-26 NOTE — Discharge Summary (Signed)
Physician Discharge Summary  Fernando GeninRichard A [name] ZOX:[id number] DOB: Dec 26, 1945 DOA: 01/20/2017  [facility]  Admit date: 01/20/2017 Discharge date: 01/26/2017  Admitted From: Home.  Disposition:  Home.   Recommendations for Outpatient Follow-up:  1. Follow up with PCP in 1-2 weeks 2. Follow up with cardiology as scheduled. 3. Follow up with Dr Adalberto ColeBelfakadu for his CKD, single kidney.   Home Health: None.  Equipment/Devices: None.  Discharge Condition: No SOB.  Cr stable, 2.18 at discharge.  CODE STATUS:  FULL CODE.  Diet recommendation:  Cardiac and renal diet.   Brief/Interim Summary: Patient was admitted for SOB and found to have acute on chronic diastolic CHF on Jan 20, 2017 by Dr Alvester MorinNewton.  As per his H and P:  " Fernando GeninRichard A Deutscher is a 71 y.o. male with medical history significant of CHF, DM, chronic kidney disease s/ baseline Cr around 2 s/p solitary kidney presentig w/ acute resp failure w/ hypoxia. Per the wife, pt was evaluated at outpt cardiology today in settting of prior dx of CHF. Pt noted to be significantly dyspneic at office visit and was subsequently sent to ER. Per the wife, the patient has gained approximately 20 lbs over the past 2 months from a weight of 296 lbs to 315 lbs. Pt has been taking torsemide 40 in am and 20 mg in evening per wife. Has compliant w/ regimen. Denies taking any NSAIDs in setting of solitary kidney. Does not directly report eating salty foods, though pt is reported to eat a lot of junk food. No CP. + Orthopnea and progressive exertional dyspnea.   ED Course: Presented to ER afebrile, hemodynamically stable. Satting in 80s on RA. Cr 2.1. CXR w/ pulm vascular congestion. No cardiomegaly. BNP 104. Trop 0.08.  Review of Systems: As per HPI otherwise 10 point review of systems negative.   HOSPITALIST:  Patient was admitted and was diuresed.  His Cr was carefully monitored, and after going up to 2.4, it improved and upon discharge, his Cr was  2.18.  He had an ECHO in June 2018, and it showed EF of 55%.  He has CKD, and only one kidney, and he has seen Dr Adalberto ColeBelfakadu previously, and was recommended to follow up with him as well.  He has been wanting to go home, and has gotten quite frustrated, but was agreeable to stay another day for further diuresing.  He was given a Lasix drip at 8mg  per hour, and did well.  His GFR did not get worsen.  He was given an option to stay longer for more diuresing, but elected to go home.  I will therefore discharging him on Torsemide at 40mg  BID as recommended by cardiology.  He will follow up with cardiology early next week, and with his PCP next week as well. He also has hx of dementia, and had some sundowning episodes.  His DM was controlled, and his elevated troponin was flat, around 0.09.  It was felt that it was due to CHF and not ACS. He will see Dr Adalberto ColeBelfakadu in follow up for his CKD, single kidney, as scheduled.   Discharge Diagnoses:  Active Problems:   Elevated troponin   Acute on chronic diastolic CHF (congestive heart failure) (HCC)   Acute respiratory failure with hypoxia [facility](HCC)   Discharge Instructions  Discharge Instructions    Diet - low sodium heart healthy    Complete by:  As directed    Discharge instructions    Complete by:  As directed    Follow up with your PCP next week and with your cardiologist as scheduled.    Take your medication as instructed. You should see Dr Adalberto Cole for follow up as well for your weak and only kidney.   Increase activity slowly    Complete by:  As directed      Allergies as of 01/26/2017      Reactions   Ibuprofen Other (See Comments)   Due to one kidney      Medication List    TAKE these medications   allopurinol 100 MG tablet Commonly known as:  ZYLOPRIM Take 100 mg by mouth daily with breakfast.   aspirin EC 81 MG tablet Take 1 tablet (81 mg total) by mouth daily. What changed:  additional instructions   cholecalciferol 1000 units  tablet Commonly known as:  VITAMIN D Take 1,000 Units by mouth daily with breakfast.   docusate sodium 50 MG capsule Commonly known as:  COLACE Take 50-100 mg by mouth 2 (two) times daily. 2 capsules in am and 1 capsule at night   donepezil 10 MG tablet Commonly known as:  ARICEPT Take 10 mg by mouth daily with breakfast.   finasteride 5 MG tablet Commonly known as:  PROSCAR Take 5 mg by mouth daily with breakfast.   glipiZIDE 5 MG tablet Commonly known as:  GLUCOTROL Take 5 mg by mouth 2 (two) times daily before a meal.   magnesium oxide 400 MG tablet Commonly known as:  MAG-OX Take 400 mg by mouth every Monday, Wednesday, and Friday.   metoprolol tartrate 50 MG tablet Commonly known as:  LOPRESSOR Take 1 tablet (50 mg total) by mouth 2 (two) times daily.   terazosin 5 MG capsule Commonly known as:  HYTRIN Take 5 mg by mouth daily with supper.   torsemide 20 MG tablet Commonly known as:  DEMADEX Take 40 mg (2 Tablet in the AM ) and 20 mg ( 1 Tablet in the PM)   vitamin B-12 1000 MCG tablet Commonly known as:  CYANOCOBALAMIN Take 1,000 mcg by mouth daily with supper.            Discharge Care Instructions        Start     Ordered   01/26/17 0000  Increase activity slowly     01/26/17 1128   01/26/17 0000  Diet - low sodium heart healthy     01/26/17 1128   01/26/17 0000  Discharge instructions    Comments:  Follow up with your PCP next week and with your cardiologist as scheduled.    Take your medication as instructed. You should see Dr Adalberto Cole for follow up as well for your weak and only kidney.   01/26/17 1128      Allergies  Allergen Reactions  . Ibuprofen Other (See Comments)    Due to one kidney    Consultations:  None.   Procedures/Studies: Dg Chest 2 View  Result Date: 01/24/2017 CLINICAL DATA:  Acute respiratory failure with hypoxemia EXAM: CHEST  2 VIEW COMPARISON:  01/20/2017 FINDINGS: Chronic cardiomegaly. Stable aortic  tortuosity. Negative hila. Mild atelectasis or scarring on the left more than right. There is no edema, consolidation, effusion, or pneumothorax. No acute osseous finding. IMPRESSION: 1. Mild, left more than right atelectasis. 2. Chronic cardiomegaly. Electronically Signed   By: Marnee Spring M.D.   On: 01/24/2017 13:42   Dg Chest Port 1 View  Result Date: 01/20/2017 CLINICAL DATA:  Shortness of Breath  EXAM: PORTABLE CHEST 1 VIEW COMPARISON:  November 18, 2016 FINDINGS: There is cardiomegaly with mild pulmonary venous hypertension. There is no frank edema or consolidation. There is atelectasis in the right base. No adenopathy. No bone lesions. IMPRESSION: Pulmonary vascular congestion. No frank edema or consolidation. Right base atelectasis. Electronically Signed   By: Bretta Bang III M.D.   On: 01/20/2017 14:26     Subjective:  I felt better today.   Discharge Exam: Vitals:   01/25/17 2216 01/26/17 0532  BP: (!) 114/58 (!) 122/51  Pulse: 74 66  Resp: 18 16  Temp: 98 F (36.7 C) 98.5 F (36.9 C)  SpO2: 99% 96%   Vitals:   01/25/17 1312 01/25/17 2216 01/26/17 0500 01/26/17 0532  BP: 115/71 (!) 114/58  (!) 122/51  Pulse: (!) 57 74  66  Resp: 16 18  16   Temp: 98.5 F (36.9 C) 98 F (36.7 C)  98.5 F (36.9 C)  TempSrc: Oral Oral  Oral  SpO2: 98% 99%  96%  Weight:   (!) 137.1 kg (302 lb 3.2 oz)   Height:        General: Pt is alert, awake, not in acute distress Cardiovascular: RRR, S1/S2 +, no rubs, no gallops Respiratory: CTA bilaterally, no wheezing, no rhonchi Abdominal: Soft, NT, ND, bowel sounds + Extremities: no edema, no cyanosis    The results of significant diagnostics from this hospitalization (including imaging, microbiology, ancillary and laboratory) are listed below for reference.    Labs: BNP (last 3 results)  Recent Labs  11/09/16 0859 11/18/16 1340 01/20/17 1410  BNP 93.3 23.3 104.0*   Basic Metabolic Panel:  Recent Labs Lab 01/22/17 0621  01/23/17 0450 01/24/17 0648 01/25/17 0613 01/26/17 0415  NA 141 138 140 139 138  K 4.1 3.9 3.8 3.8 3.8  CL 94* 89* 92* 88* 89*  CO2 39* 39* 39* 41* 38*  GLUCOSE 121* 108* 113* 123* 131*  BUN 25* 30* 35* 37* 38*  CREATININE 2.05* 1.98* 2.07* 2.41* 2.18*  CALCIUM 9.1 8.9 9.0 9.1 9.0   Liver Function Tests:  Recent Labs Lab 01/20/17 1410 01/21/17 0411  AST 20 12*  ALT 10* 9*  ALKPHOS 43 41  BILITOT 0.2* 0.3  PROT 6.6 6.3*  ALBUMIN 3.1* 3.0*   CBC:  Recent Labs Lab 01/20/17 1410 01/21/17 0411  WBC 6.9 6.8  NEUTROABS 4.8  --   HGB 10.4* 10.4*  HCT 36.1* 36.1*  MCV 87.8 89.8  PLT 225 275   Cardiac Enzymes:  Recent Labs Lab 01/20/17 1410 01/20/17 1725 01/20/17 2121 01/21/17 0411  TROPONINI 0.08* 0.09* 0.08* 0.09*   BNP: Invalid input(s): POCBNP CBG:  Recent Labs Lab 01/25/17 1102 01/25/17 1629 01/25/17 2144 01/26/17 0722 01/26/17 1111  GLUCAP 149* 154* 186* 127* 172*   Urinalysis    Component Value Date/Time   COLORURINE STRAW (A) 11/18/2016 1340   APPEARANCEUR CLEAR 11/18/2016 1340   LABSPEC 1.005 11/18/2016 1340   PHURINE 6.0 11/18/2016 1340   GLUCOSEU NEGATIVE 11/18/2016 1340   HGBUR NEGATIVE 11/18/2016 1340   BILIRUBINUR NEGATIVE 11/18/2016 1340   KETONESUR NEGATIVE 11/18/2016 1340   PROTEINUR NEGATIVE 11/18/2016 1340   UROBILINOGEN 0.2 07/15/2009 2249   NITRITE NEGATIVE 11/18/2016 1340   LEUKOCYTESUR NEGATIVE 11/18/2016 1340    Time coordinating discharge: Over 30 minutes SIGNED:  Houston Siren, MD FACP [facility] 01/26/2017, 11:28 AM   If 7PM-7AM, please contact night-coverage [email or web address] Password [email or web address]

## 2017-01-26 NOTE — Care Management Important Message (Signed)
Important Message  Patient Details  Name: Fernando GeninRichard A Feick MRN: [id number] Date of Birth: [birth year]   Medicare Important Message Given:  Yes    Malcolm MetroChildress, Carlotta Telfair Demske, RN 01/26/2017, 1:17 PM

## 2017-01-26 NOTE — Progress Notes (Signed)
Patient ambulated on RA, sats did not drop below 92% - does not meet requirement for home O2.  Patient tolerated well.

## 2017-01-26 NOTE — Plan of Care (Signed)
Problem: Safety: Goal: Ability to remain free from injury will improve Outcome: Adequate for Discharge Pt continues to be a high fall risk, but up to BR with stand by assist. Pt has steady gait and no c/o dizziness upon standing. Pt educated on safety precautions as well as interventions to prevent falls. Pt verbalized understanding. Will continue to monitor pt

## 2017-01-26 NOTE — Care Management Note (Signed)
Case Management Note  Patient Details  Name: Fernando [name] MRN: [id number] Date of Birth: April 07, 1946  Expected Discharge Date:  01/26/17               Expected Discharge Plan:  Home/Self Care  In-House Referral:  NA  Discharge planning Services  CM Consult  Post Acute Care Choice:  NA Choice offered to:  NA  Status of Service:  Completed, signed off  If discussed at Long Length of Stay Meetings, dates discussed:  01/26/2017  Additional Comments: Pt discharged home today with self care. Ambulated with nursing staff and did not desaturate. No CM needs noted at DC.  Malcolm Metrohildress, Odell Choung Demske, RN 01/26/2017, 1:17 PM

## 2017-01-26 NOTE — Progress Notes (Signed)
Patient discharged home. IV removed - WNL.  Reviewed DC instructions and medications.  Instructed to follow up with PCP, cardio, and nephrology. Patient and wife state they will make all follow up appointments tomorrow morning themselves.  Educated on HF management at home; hand outs given.  Patient and wife verbalize understanding.  No questions at this time.  Patient assisted off unit via WC in NAD.

## 2017-02-05 NOTE — Progress Notes (Signed)
Cardiology Office Note   Date:  02/06/2017   ID:  Fernando, [name] 06-19-1945, MRN [id number]  PCP:  [facility]  Cardiologist:  Wyline Mood  Chief Complaint  Patient presents with  . Congestive Heart Failure      History of Present Illness: Fernando [name] is a 71 y.o. male who presents for ongoing assessment and management of chronic diastolic CHF. He was last seen in the office on 01/20/2017 and was found to be volume overloaded. He was sent from the office to hospital for admission., He was discharged on 01/26/2017 after diureses from to 302 lbs on lasix gtt. He has hx of CKD and a single kidney. He was to be followed as OP by Dr. Fausto Skillern. Discharged on torsemide 40 mg in the am and 20 mg in the pm. Daily wts and salt restriction.   He comes today feeling better. He has lost 14 pounds since being hospitalized and diuresis. Medications were not changed. His wife put like some help concerning his diet. And is requesting a nutritionist see him. The patient continues to sneak foods that have salt in them, but for the most part his wife is managing to keep his diet well controlled. She is also in charge of his medications.  He denies worsening symptoms of shortness of breath dyspnea, he has chronic dependent edema. Past Medical History:  Diagnosis Date  . Arthritis   . CHF (congestive heart failure) (HCC)   . Diabetes mellitus without complication (HCC)   . Hypertension   . Renal disorder     Past Surgical History:  Procedure Laterality Date  . BACK SURGERY    . NEPHRECTOMY Left 1967     Current Outpatient Prescriptions  Medication Sig Dispense Refill  . allopurinol (ZYLOPRIM) 100 MG tablet Take 100 mg by mouth daily with breakfast.    . aspirin EC 81 MG tablet Take 1 tablet (81 mg total) by mouth daily. (Patient taking differently: Take 81 mg by mouth daily. Every other day)    . cholecalciferol (VITAMIN D) 1000 units tablet Take 1,000 Units by mouth daily with  breakfast.     . docusate sodium (COLACE) 50 MG capsule Take 50-100 mg by mouth 2 (two) times daily. 2 capsules in am and 1 capsule at night    . donepezil (ARICEPT) 10 MG tablet Take 10 mg by mouth daily with breakfast.     . finasteride (PROSCAR) 5 MG tablet Take 5 mg by mouth daily with breakfast.     . glipiZIDE (GLUCOTROL) 5 MG tablet Take 5 mg by mouth 2 (two) times daily before a meal.    . magnesium oxide (MAG-OX) 400 MG tablet Take 400 mg by mouth every Monday, Wednesday, and Friday.     . metoprolol tartrate (LOPRESSOR) 50 MG tablet Take 1 tablet (50 mg total) by mouth 2 (two) times daily. 60 tablet 6  . terazosin (HYTRIN) 5 MG capsule Take 5 mg by mouth daily with supper.     . torsemide (DEMADEX) 20 MG tablet Take 40 mg (2 Tablet in the AM ) and 20 mg ( 1 Tablet in the PM) 180 tablet 3  . vitamin B-12 (CYANOCOBALAMIN) 1000 MCG tablet Take 1,000 mcg by mouth daily with supper.      No current facility-administered medications for this visit.     Allergies:   Ibuprofen    Social History:  The patient  reports that he quit smoking about 46 years ago. He has  never used smokeless tobacco. He reports that he does not drink alcohol or use drugs.   Family History:  The patient's family history includes Heart disease in his mother.    ROS: All other systems are reviewed and negative. Unless otherwise mentioned in H&P    PHYSICAL EXAM: VS:  BP 134/72   Pulse 88   Ht 6' (1.829 m)   Wt (!) 306 lb (138.8 kg)   SpO2 92%   BMI 41.50 kg/m  , BMI Body mass index is 41.5 kg/m. GEN: Well nourished, well developed, in no acute distress  HEENT: normal  Neck: no JVD, carotid bruits, or masses Cardiac: RRR; no murmurs, rubs, or gallops, TED hose in place, some tightness with nonpitting edema  Respiratory:  clear to auscultation bilaterally, normal work of breathing GI: soft, nontender, nondistended, + BS. Obese,   MS: no deformity or atrophy  Skin: warm and dry, no rash Neuro:   Strength and sensation are intact Psych: euthymic mood, full affect   Recent Labs: 11/18/2016: TSH 1.487 12/06/2016: Magnesium 1.9 01/20/2017: B Natriuretic Peptide 104.0 01/21/2017: ALT 9; Hemoglobin 10.4; Platelets 275 01/26/2017: BUN 38; Creatinine, Ser 2.18; Potassium 3.8; Sodium 138    Lipid Panel    Component Value Date/Time   CHOL  07/16/2009 0415    160        ATP III CLASSIFICATION:  <200     mg/dL   Desirable  161-096200-239  mg/dL   Borderline High  >=045>=240    mg/dL   High          TRIG [id number] 07/16/2009 0415   HDL 29 (L) 07/16/2009 0415   CHOLHDL 5.5 07/16/2009 0415   VLDL 28 07/16/2009 0415   LDLCALC (H) 07/16/2009 0415    103        Total Cholesterol/HDL:CHD Risk Coronary Heart Disease Risk Table                     Men   Women  1/2 Average Risk   3.4   3.3  Average Risk       5.0   4.4  2 X Average Risk   9.6   7.1  3 X Average Risk  23.4   11.0        Use the calculated Patient Ratio above and the CHD Risk Table to determine the patient's CHD Risk.        ATP III CLASSIFICATION (LDL):  <100     mg/dL   Optimal  811-914100-129  mg/dL   Near or Above                    Optimal  130-159  mg/dL   Borderline  782-956160-189  mg/dL   High  >213>190     mg/dL   Very High      Wt Readings from Last 3 Encounters:  02/06/17 (!) 306 lb (138.8 kg)  01/26/17 (!) 302 lb 3.2 oz (137.1 kg)  01/20/17 (!) 320 lb 9.6 oz (145.4 kg)      Other studies Reviewed: Echocardiogram 11/11/2016  Left ventricle: The cavity size was normal. Wall thickness was   increased in a pattern of moderate LVH. Systolic function was   normal. The estimated ejection fraction was in the range of 55%   to 60%. The study is not technically sufficient to allow   evaluation of LV diastolic function. - Mitral valve: Calcified annulus. Mildly thickened leaflets . - Left atrium: The  atrium was moderately dilated. - Right ventricle: Not well seen on subcostal appears mildly   dilated on apical views.  ASSESSMENT AND  PLAN:  1.  Acute on chronic diastolic heart failure: The patient was recently hospitalized placed on a Lasix drip and has diuresed approximately 20 pounds. He was sent home on current medication regimen without change his prior to admission. He continues to sneak salted foods, but his wife is. Diligent on his medications and low salt diet that she makes for him at home.   She is requesting  a dietitian or nutritionist evaluation. I'm going to have [facility] see them to assist her with this. They may also have other hopeful ideas and incentives to support her as she cares for her husband. He does have some mild dementia and she is carrying a heavy burden concerning his health maintenance and ongoing CHF management. This by hopes that [facility] and can be helpful to her and him to prevent him from frequent decompensation.   2. Chronic kidney disease with renal insufficiency: The patient has a single kidney. He remains on current medication regimen with torsemide 40 and the morning and 20 in the afternoon. Most recent creatinine 2.18, close to baseline at 2.07. We'll continue to monitor.  Current medicines are reviewed at length with the patient today.    Labs/ tests ordered today include:  Bettey Mare. Liborio Nixon, ANP, AACC   02/06/2017 4:13 PM    [facility] [address], [place], [place] [address] Phone: [phone]; Fax: [phone]

## 2017-02-06 ENCOUNTER — Encounter: Payer: Self-pay | Admitting: Adult Health

## 2017-02-06 ENCOUNTER — Ambulatory Visit (INDEPENDENT_AMBULATORY_CARE_PROVIDER_SITE_OTHER): Payer: Medicare Other | Admitting: Adult Health

## 2017-02-06 VITALS — BP 134/72 | HR 88 | Ht 72.0 in | Wt 306.0 lb

## 2017-02-06 DIAGNOSIS — Z79899 Other long term (current) drug therapy: Secondary | ICD-10-CM | POA: Diagnosis not present

## 2017-02-06 MED ORDER — METOPROLOL TARTRATE 50 MG PO TABS: 50.0000 mg | ORAL_TABLET | Freq: Two times a day (BID) | ORAL | 0 refills | Status: DC

## 2017-02-06 MED ORDER — TORSEMIDE 20 MG PO TABS: ORAL_TABLET | ORAL | 3 refills | Status: DC

## 2017-02-06 MED ORDER — METOPROLOL TARTRATE 50 MG PO TABS: 50.0000 mg | ORAL_TABLET | Freq: Two times a day (BID) | ORAL | 6 refills | Status: DC

## 2017-02-06 NOTE — Patient Instructions (Signed)
Medication Instructions:  Your physician recommends that you continue on your current medications as directed. Please refer to the Current Medication list given to you today.   Labwork: NONE  Testing/Procedures: NONE  Follow-Up: Your physician recommends that you schedule a follow-up appointment in: 3 MONTHS     Any Other Special Instructions Will Be Listed Below (If Applicable).  PLEASE GET THE ELECTRONIC SCALE TO HELP MONITOR WEIGHTS  [facility] - A home health nurse will contact you soon.     If you need a refill on your cardiac medications before your next appointment, please call your pharmacy.

## 2017-02-09 ENCOUNTER — Other Ambulatory Visit: Payer: Self-pay

## 2017-02-09 MED ORDER — TORSEMIDE 20 MG PO TABS: ORAL_TABLET | ORAL | 3 refills | Status: DC

## 2017-05-08 ENCOUNTER — Ambulatory Visit: Payer: Medicare Other | Admitting: Adult Health

## 2017-05-11 ENCOUNTER — Other Ambulatory Visit (HOSPITAL_COMMUNITY)
Admission: RE | Admit: 2017-05-11 | Discharge: 2017-05-11 | Disposition: A | Discharge status: Home / Self Care | Payer: Medicare Other | Source: Ambulatory Visit | Attending: Student | Admitting: Student

## 2017-05-11 ENCOUNTER — Ambulatory Visit: Payer: Medicare Other | Admitting: Student

## 2017-05-11 ENCOUNTER — Encounter: Payer: Self-pay | Admitting: Student

## 2017-05-11 VITALS — BP 140/88 | HR 85 | Ht 73.0 in | Wt 314.0 lb

## 2017-05-11 DIAGNOSIS — Z79899 Other long term (current) drug therapy: Secondary | ICD-10-CM | POA: Diagnosis not present

## 2017-05-11 DIAGNOSIS — N183 Chronic kidney disease, stage 3 unspecified: Secondary | ICD-10-CM

## 2017-05-11 DIAGNOSIS — I10 Essential (primary) hypertension: Secondary | ICD-10-CM | POA: Diagnosis not present

## 2017-05-11 DIAGNOSIS — K921 Melena: Secondary | ICD-10-CM

## 2017-05-11 DIAGNOSIS — I5032 Chronic diastolic (congestive) heart failure: Secondary | ICD-10-CM | POA: Diagnosis not present

## 2017-05-11 LAB — CBC WITH DIFFERENTIAL/PLATELET
BASOS ABS: 0 10*3/uL (ref 0.0–0.1)
Basophils Relative: 0 %
EOS ABS: 0.1 10*3/uL (ref 0.0–0.7)
EOS PCT: 1 %
HCT: 42.2 % (ref 39.0–52.0)
HEMOGLOBIN: 11.9 g/dL — AB (ref 13.0–17.0)
LYMPHS ABS: 1.3 10*3/uL (ref 0.7–4.0)
Lymphocytes Relative: 14 %
MCH: 23.8 pg — ABNORMAL LOW (ref 26.0–34.0)
MCHC: 28.2 g/dL — AB (ref 30.0–36.0)
MCV: 84.2 fL (ref 78.0–100.0)
Monocytes Absolute: 1.1 10*3/uL — ABNORMAL HIGH (ref 0.1–1.0)
Monocytes Relative: 11 %
NEUTROS PCT: 74 %
Neutro Abs: 6.9 10*3/uL (ref 1.7–7.7)
Platelets: 233 10*3/uL (ref 150–400)
RBC: 5.01 MIL/uL (ref 4.22–5.81)
RDW: 16.9 % — AB (ref 11.5–15.5)
WBC: 9.3 10*3/uL (ref 4.0–10.5)

## 2017-05-11 LAB — BASIC METABOLIC PANEL
Anion gap: 10 (ref 5–15)
BUN: 31 mg/dL — AB (ref 6–20)
CHLORIDE: 92 mmol/L — AB (ref 101–111)
CO2: 34 mmol/L — ABNORMAL HIGH (ref 22–32)
CREATININE: 2.25 mg/dL — AB (ref 0.61–1.24)
Calcium: 9.4 mg/dL (ref 8.9–10.3)
GFR, EST AFRICAN AMERICAN: 32 mL/min — AB (ref >60)
GFR, EST NON AFRICAN AMERICAN: 27 mL/min — AB (ref >60)
Glucose, Bld: 108 mg/dL — ABNORMAL HIGH (ref 65–99)
POTASSIUM: 4.3 mmol/L (ref 3.5–5.1)
SODIUM: 136 mmol/L (ref 135–145)

## 2017-05-11 NOTE — Progress Notes (Signed)
Cardiology Office Note    Date:  05/11/2017   ID:  Fernando [name], DOB 12-06-1945, MRN [id number]  PCP:  Fernando [name], Fernando [name]  Cardiologist: Dr. Wyline [name]  Chief Complaint  Patient presents with  . Follow-up    3 month visit    History of Present Illness:    Fernando [name] is a 71 y.o. male with past [name] history of chronic diastolic CHF, HTN, Type 2 DM, Stage 3 CKD (single kidney), and dementia in talking with the patient today, he denies any acute changes in his symptoms.  Who presents to the office today for 21-month follow-up.   He was last examined by Fernando Reining, NP in 01/2017 following a recent hospital admission for acute hypoxic respiratory failure in the setting of acute on chronic diastolic CHF. He was doing well at the time of his visit and weight had continued to trend down, at 306 lbs that day. He was continued on Torsemide 40mg  in AM/20mg  in PM and was referred to a dietitian at that time.   Most history is obtained by his wife. She reports that he is very sedentary and usually sits in his recliner throughout the day. His most active regimen is walking to the mailbox occasionally. He denies any associated chest pain or dyspnea on exertion with this activity. No recent orthopnea or PND. He has chronic lower extremity edema and his wife reports his lower extremities have been red in color prior to his hospitalization in 12/2016. She has not noticed any acute changes in this. They use compression stockings on occasion but he does not like to wear these. He has been weighing regularly at home and weight has been stable at 308 - 310 lbs on his home scales.   The patient does report having a rather large bowel movement earlier today and says there was bright red blood in the toilet.His wife says this usually occurs 1-2 times per month and they have been informed this is secondary to hemorrhoids. He remains on Colace once daily and denies any recent constipation.     Past [name] History:  Diagnosis Date  . Arthritis   . CHF (congestive heart failure) (HCC)   . Diabetes mellitus without complication (HCC)   . Hypertension   . Renal disorder     Past Surgical History:  Procedure Laterality Date  . BACK SURGERY    . NEPHRECTOMY Left 1967    Current Medications: Outpatient Medications Prior to Visit  Medication Sig Dispense Refill  . allopurinol (ZYLOPRIM) 100 MG tablet Take 100 mg by mouth daily with breakfast.    . aspirin EC 81 MG tablet Take 81 mg by mouth every other day.    . cholecalciferol (VITAMIN D) 1000 units tablet Take 1,000 Units by mouth daily with breakfast.     . docusate sodium (COLACE) 50 MG capsule Take 50-100 mg by mouth 2 (two) times daily. 2 capsules in am and 1 capsule at night    . donepezil (ARICEPT) 10 MG tablet Take 10 mg by mouth daily with breakfast.     . finasteride (PROSCAR) 5 MG tablet Take 5 mg by mouth daily with breakfast.     . glipiZIDE (GLUCOTROL) 5 MG tablet Take 5 mg by mouth 2 (two) times daily before a meal.    . magnesium oxide (MAG-OX) 400 MG tablet Take 400 mg by mouth every Monday, Wednesday, and Friday.     . metoprolol tartrate (LOPRESSOR) 50 MG tablet Take 1  tablet (50 mg total) by mouth 2 (two) times daily. 60 tablet 6  . terazosin (HYTRIN) 5 MG capsule Take 5 mg by mouth daily with supper.     . torsemide (DEMADEX) 20 MG tablet Take 40 mg (2 Tablet in the AM ) and 20 mg ( 1 Tablet in the PM) 180 tablet 3  . vitamin B-12 (CYANOCOBALAMIN) 1000 MCG tablet Take 1,000 mcg by mouth daily with supper.     Marland Kitchen. aspirin EC 81 MG tablet Take 1 tablet (81 mg total) by mouth daily. (Patient taking differently: Take 81 mg by mouth daily. Every other day)     No facility-administered medications prior to visit.      Allergies:   Ibuprofen   Social History   Socioeconomic History  . Marital status: Married    Spouse name: None  . Number of children: None  . Years of education: None  . Highest  education level: None  Social Needs  . Financial resource strain: None  . Food insecurity - worry: None  . Food insecurity - inability: None  . Transportation needs - [name]: None  . Transportation needs - non-[name]: None  Occupational History  . None  Tobacco Use  . Smoking status: Former Smoker    Last attempt to quit: 05/30/1970    Years since quitting: 46.9  . Smokeless tobacco: Never Used  Substance and Sexual Activity  . Alcohol use: No  . Drug use: No  . Sexual activity: No  Other Topics Concern  . None  Social History Narrative  . None     Family History:  The patient's family history includes Heart disease in his mother.   Review of Systems:   Please see the history of present illness.     General:  No chills, fever, night sweats or weight changes.  Cardiovascular:  No chest pain, dyspnea on exertion, orthopnea, palpitations, paroxysmal nocturnal dyspnea. Positive for lower extremity edema.   Dermatological: No rash, lesions/masses Respiratory: No cough, dyspnea Urologic: No hematuria, dysuria Abdominal:   No nausea, vomiting, diarrhea, bright red blood per rectum, melena, or hematemesis Neurologic:  No visual changes or wkns, Positive for changes in mental status.  All other systems reviewed and are otherwise negative except as noted above.   Physical Exam:    VS:  BP 140/88   Pulse 85   Ht 6\' 1"  (1.854 m)   Wt (!) 314 lb (142.4 kg)   SpO2 92%   BMI 41.43 kg/m    General: Well developed, obese Caucasian male appearing in no acute distress. Head: Normocephalic, atraumatic, sclera non-icteric, no xanthomas, nares are without discharge.  Neck: No carotid bruits. JVD unable to be assessed secondary to body habitus. Lungs: Respirations regular and unlabored, without wheezes or rales.  Heart: Regular rate and rhythm. No S3 or S4.  No murmur, no rubs, or gallops appreciated. Abdomen: Soft, non-tender, non-distended with normoactive bowel sounds. No  hepatomegaly. No rebound/guarding. No obvious abdominal masses. Msk:  Strength and tone appear normal for age. No joint deformities or effusions. Extremities: No clubbing or cyanosis. 1+ chronic pitting edema.  Distal pedal pulses are 2+ bilaterally. Lower extremities erythematous.  Neuro: Alert and oriented X 3. Moves all extremities spontaneously. No focal deficits noted. Psych:  Responds to questions appropriately with a normal affect. Skin: No rashes or lesions noted  Wt Readings from Last 3 Encounters:  05/11/17 (!) 314 lb (142.4 kg)  02/06/17 (!) 306 lb (138.8 kg)  01/26/17 (!) 302  lb 3.2 oz (137.1 kg)     Studies/Labs Reviewed:   EKG:  EKG is not ordered today.   Recent Labs: 11/18/2016: TSH 1.487 12/06/2016: Magnesium 1.9 01/20/2017: B Natriuretic Peptide 104.0 01/21/2017: ALT 9 05/11/2017: BUN 31; Creatinine, Ser 2.25; Hemoglobin 11.9; Platelets 233; Potassium 4.3; Sodium 136   Lipid Panel    Component Value Date/Time   CHOL  07/16/2009 0415    160        ATP III CLASSIFICATION:  <200     mg/dL   Desirable  161-096  mg/dL   Borderline High  >=045    mg/dL   High          TRIG 409 07/16/2009 0415   HDL 29 (L) 07/16/2009 0415   CHOLHDL 5.5 07/16/2009 0415   VLDL 28 07/16/2009 0415   LDLCALC (H) 07/16/2009 0415    103        Total Cholesterol/HDL:CHD Risk Coronary Heart Disease Risk Table                     Men   Women  1/2 Average Risk   3.4   3.3  Average Risk       5.0   4.4  2 X Average Risk   9.6   7.1  3 X Average Risk  23.4   11.0        Use the calculated Patient Ratio above and the CHD Risk Table to determine the patient's CHD Risk.        ATP III CLASSIFICATION (LDL):  <100     mg/dL   Optimal  811-914  mg/dL   Near or Above                    Optimal  130-159  mg/dL   Borderline  782-956  mg/dL   High  >213     mg/dL   Very High    Additional studies/ records that were reviewed today include:   Echocardiogram: 10/2016 Study  Conclusions  - Left ventricle: The cavity size was normal. Wall thickness was   increased in a pattern of moderate LVH. Systolic function was   normal. The estimated ejection fraction was in the range of 55%   to 60%. The study is not technically sufficient to allow   evaluation of LV diastolic function. - Mitral valve: Calcified annulus. Mildly thickened leaflets . - Left atrium: The atrium was moderately dilated. - Right ventricle: Not well seen on subcostal appears mildly   dilated on apical views.  Assessment:    1. Chronic diastolic (congestive) heart failure (HCC)   2. Drug therapy   3. Essential hypertension   4. CKD (chronic kidney disease) stage 3, GFR 30-59 ml/min (HCC)   5. Hematochezia      Plan:   In order of problems listed above:  1. Chronic Diastolic CHF - hospitalized in 12/2016 for a CHF exacerbation. He continues to have chronic lower extremity edema but denies any changes in his respiratory status. Weight has been stable at 308 - 310 lbs on his home scales. - will continue on current dosing of Torsemide at 40mg  in AM/20mg  in PM. Recheck BMET today. Instructed the patient he can take an extra Torsemide tablet if weight increases by > 3 lbs overnight or > 5 lbs in one week.   2. HTN - BP is mildly elevated at 140/88 during today's visit. Patient's wife reports this is usually well-controlled at home.  Will continue on Lopressor 50mg  BID and Terazosin. Consider addition of Amlodipine if additional BP control is needed.   3. Stage 3 CKD - [facility]. Will recheck BMET today.   4. Hematochezia - he reports having 1-2 episodes per month of hematochezia following a large bowel movement. Has been diagnosed with hemorrhoids in the past.  - will recheck a CBC today. Continue with Colace and PRN Miralax. Recommended he follow-up with his PCP if symptoms persist.    Medication Adjustments/Labs and Tests Ordered: Current medicines are  reviewed at length with the patient today.  Concerns regarding medicines are outlined above.  Medication changes, Labs and Tests ordered today are listed in the Patient Instructions below. Patient Instructions  Medication Instructions:  Your physician recommends that you continue on your current medications as directed. Please refer to the Current Medication list given to you today.   Labwork: Your physician recommends that you return for lab work in: Today  Testing/Procedures: NONE   Follow-Up: Your physician recommends that you schedule a follow-up appointment in: 3 Months   Any Other Special Instructions Will Be Listed Below (If Applicable). May Take an Extra Torsemide if you gain 5 pounds in a week.   If you need a refill on your cardiac medications before your next appointment, please call your pharmacy.  Thank you for choosing [facility]!    Signed, Fernando LennoxBrittany M Larri Brewton, PA-C  05/11/2017 5:13 PM    [name] [address], [address] [place], [place]  [address] Phone: [phone]; Fax: [phone]  [address], [address] [place], [place] [address] Phone: [phone]

## 2017-05-11 NOTE — Patient Instructions (Signed)
Medication Instructions:  Your physician recommends that you continue on your current medications as directed. Please refer to the Current Medication list given to you today.   Labwork: Your physician recommends that you return for lab work in: Today  Testing/Procedures: NONE   Follow-Up: Your physician recommends that you schedule a follow-up appointment in: 3 Months    Any Other Special Instructions Will Be Listed Below (If Applicable).  May Take an Extra Torsemide if you gain 5 pounds in a week.    If you need a refill on your cardiac medications before your next appointment, please call your pharmacy.  Thank you for choosing [facility]!

## 2017-05-24 ENCOUNTER — Ambulatory Visit: Payer: Medicare Other | Admitting: Physician Assistant

## 2017-06-30 ENCOUNTER — Encounter (HOSPITAL_COMMUNITY): Payer: Self-pay | Admitting: Emergency Medicine

## 2017-06-30 ENCOUNTER — Emergency Department (HOSPITAL_COMMUNITY): Payer: Medicare Other

## 2017-06-30 ENCOUNTER — Inpatient Hospital Stay (HOSPITAL_COMMUNITY)
Admission: EM | Admit: 2017-06-30 | Discharge: 2017-07-11 | DRG: 193 | Disposition: A | Discharge status: Home Health | Payer: Medicare Other | Attending: Internal Medicine | Admitting: Internal Medicine

## 2017-06-30 ENCOUNTER — Other Ambulatory Visit: Payer: Self-pay

## 2017-06-30 DIAGNOSIS — J9621 Acute and chronic respiratory failure with hypoxia: Secondary | ICD-10-CM | POA: Diagnosis not present

## 2017-06-30 DIAGNOSIS — I10 Essential (primary) hypertension: Secondary | ICD-10-CM | POA: Diagnosis present

## 2017-06-30 DIAGNOSIS — M1990 Unspecified osteoarthritis, unspecified site: Secondary | ICD-10-CM | POA: Diagnosis not present

## 2017-06-30 DIAGNOSIS — J189 Pneumonia, unspecified organism: Principal | ICD-10-CM | POA: Diagnosis present

## 2017-06-30 DIAGNOSIS — Z66 Do not resuscitate: Secondary | ICD-10-CM | POA: Diagnosis not present

## 2017-06-30 DIAGNOSIS — Z8249 Family history of ischemic heart disease and other diseases of the circulatory system: Secondary | ICD-10-CM

## 2017-06-30 DIAGNOSIS — Z23 Encounter for immunization: Secondary | ICD-10-CM

## 2017-06-30 DIAGNOSIS — N183 Chronic kidney disease, stage 3 unspecified: Secondary | ICD-10-CM | POA: Diagnosis present

## 2017-06-30 DIAGNOSIS — T17908A Unspecified foreign body in respiratory tract, part unspecified causing other injury, initial encounter: Secondary | ICD-10-CM

## 2017-06-30 DIAGNOSIS — J9602 Acute respiratory failure with hypercapnia: Secondary | ICD-10-CM | POA: Diagnosis not present

## 2017-06-30 DIAGNOSIS — N184 Chronic kidney disease, stage 4 (severe): Secondary | ICD-10-CM | POA: Diagnosis not present

## 2017-06-30 DIAGNOSIS — J9622 Acute and chronic respiratory failure with hypercapnia: Secondary | ICD-10-CM | POA: Diagnosis present

## 2017-06-30 DIAGNOSIS — R0689 Other abnormalities of breathing: Secondary | ICD-10-CM | POA: Diagnosis not present

## 2017-06-30 DIAGNOSIS — E1122 Type 2 diabetes mellitus with diabetic chronic kidney disease: Secondary | ICD-10-CM | POA: Diagnosis present

## 2017-06-30 DIAGNOSIS — J9601 Acute respiratory failure with hypoxia: Secondary | ICD-10-CM | POA: Diagnosis present

## 2017-06-30 DIAGNOSIS — A419 Sepsis, unspecified organism: Secondary | ICD-10-CM | POA: Diagnosis not present

## 2017-06-30 DIAGNOSIS — I130 Hypertensive heart and chronic kidney disease with heart failure and stage 1 through stage 4 chronic kidney disease, or unspecified chronic kidney disease: Secondary | ICD-10-CM | POA: Diagnosis not present

## 2017-06-30 DIAGNOSIS — R0602 Shortness of breath: Secondary | ICD-10-CM | POA: Diagnosis not present

## 2017-06-30 DIAGNOSIS — R0902 Hypoxemia: Secondary | ICD-10-CM | POA: Diagnosis not present

## 2017-06-30 DIAGNOSIS — Z87891 Personal history of nicotine dependence: Secondary | ICD-10-CM

## 2017-06-30 DIAGNOSIS — Z7984 Long term (current) use of oral hypoglycemic drugs: Secondary | ICD-10-CM

## 2017-06-30 DIAGNOSIS — I5031 Acute diastolic (congestive) heart failure: Secondary | ICD-10-CM | POA: Diagnosis not present

## 2017-06-30 DIAGNOSIS — Z886 Allergy status to analgesic agent status: Secondary | ICD-10-CM

## 2017-06-30 DIAGNOSIS — Z79899 Other long term (current) drug therapy: Secondary | ICD-10-CM

## 2017-06-30 DIAGNOSIS — G9340 Encephalopathy, unspecified: Secondary | ICD-10-CM | POA: Diagnosis not present

## 2017-06-30 DIAGNOSIS — G9341 Metabolic encephalopathy: Secondary | ICD-10-CM | POA: Diagnosis not present

## 2017-06-30 DIAGNOSIS — E662 Morbid (severe) obesity with alveolar hypoventilation: Secondary | ICD-10-CM | POA: Diagnosis not present

## 2017-06-30 DIAGNOSIS — R0989 Other specified symptoms and signs involving the circulatory and respiratory systems: Secondary | ICD-10-CM | POA: Diagnosis not present

## 2017-06-30 DIAGNOSIS — E1151 Type 2 diabetes mellitus with diabetic peripheral angiopathy without gangrene: Secondary | ICD-10-CM | POA: Diagnosis not present

## 2017-06-30 DIAGNOSIS — F0280 Dementia in other diseases classified elsewhere without behavioral disturbance: Secondary | ICD-10-CM | POA: Diagnosis not present

## 2017-06-30 DIAGNOSIS — I5033 Acute on chronic diastolic (congestive) heart failure: Secondary | ICD-10-CM | POA: Diagnosis present

## 2017-06-30 DIAGNOSIS — Z7982 Long term (current) use of aspirin: Secondary | ICD-10-CM | POA: Diagnosis not present

## 2017-06-30 DIAGNOSIS — J9692 Respiratory failure, unspecified with hypercapnia: Secondary | ICD-10-CM | POA: Diagnosis present

## 2017-06-30 DIAGNOSIS — F0390 Unspecified dementia without behavioral disturbance: Secondary | ICD-10-CM | POA: Diagnosis present

## 2017-06-30 DIAGNOSIS — Z905 Acquired absence of kidney: Secondary | ICD-10-CM | POA: Diagnosis not present

## 2017-06-30 DIAGNOSIS — E1121 Type 2 diabetes mellitus with diabetic nephropathy: Secondary | ICD-10-CM | POA: Diagnosis not present

## 2017-06-30 DIAGNOSIS — R4182 Altered mental status, unspecified: Secondary | ICD-10-CM | POA: Diagnosis not present

## 2017-06-30 DIAGNOSIS — Z6841 Body Mass Index (BMI) 40.0 and over, adult: Secondary | ICD-10-CM | POA: Diagnosis not present

## 2017-06-30 DIAGNOSIS — E1129 Type 2 diabetes mellitus with other diabetic kidney complication: Secondary | ICD-10-CM | POA: Diagnosis present

## 2017-06-30 DIAGNOSIS — G4733 Obstructive sleep apnea (adult) (pediatric): Secondary | ICD-10-CM | POA: Diagnosis not present

## 2017-06-30 DIAGNOSIS — Z9119 Patient's noncompliance with other medical treatment and regimen: Secondary | ICD-10-CM

## 2017-06-30 DIAGNOSIS — G309 Alzheimer's disease, unspecified: Secondary | ICD-10-CM | POA: Diagnosis not present

## 2017-06-30 HISTORY — DX: Unspecified dementia, unspecified severity, without behavioral disturbance, psychotic disturbance, mood disturbance, and anxiety: F03.90

## 2017-06-30 LAB — CBG MONITORING, ED
GLUCOSE-CAPILLARY: 122 mg/dL — AB (ref 65–99)
Glucose-Capillary: 105 mg/dL — ABNORMAL HIGH (ref 65–99)

## 2017-06-30 LAB — BLOOD GAS, ARTERIAL
ACID-BASE EXCESS: 11.6 mmol/L — AB (ref 0.0–2.0)
Bicarbonate: 40.7 mmol/L — ABNORMAL HIGH (ref 20.0–28.0)
DRAWN BY: 295031
O2 CONTENT: 2 L/min
O2 Saturation: 88.9 %
PATIENT TEMPERATURE: 98.6
pCO2 arterial: 88.7 mmHg (ref 32.0–48.0)
pH, Arterial: 7.284 — ABNORMAL LOW (ref 7.350–7.450)
pO2, Arterial: 66.8 mmHg — ABNORMAL LOW (ref 83.0–108.0)

## 2017-06-30 LAB — COMPREHENSIVE METABOLIC PANEL
ALT: 9 U/L — ABNORMAL LOW (ref 17–63)
AST: 14 U/L — ABNORMAL LOW (ref 15–41)
Albumin: 3.2 g/dL — ABNORMAL LOW (ref 3.5–5.0)
Alkaline Phosphatase: 49 U/L (ref 38–126)
Anion gap: 9 (ref 5–15)
BUN: 34 mg/dL — ABNORMAL HIGH (ref 6–20)
CO2: 37 mmol/L — ABNORMAL HIGH (ref 22–32)
Calcium: 8.7 mg/dL — ABNORMAL LOW (ref 8.9–10.3)
Chloride: 99 mmol/L — ABNORMAL LOW (ref 101–111)
Creatinine, Ser: 2.44 mg/dL — ABNORMAL HIGH (ref 0.61–1.24)
GFR calc Af Amer: 29 mL/min — ABNORMAL LOW (ref >60)
GFR calc non Af Amer: 25 mL/min — ABNORMAL LOW (ref >60)
Glucose, Bld: 106 mg/dL — ABNORMAL HIGH (ref 65–99)
Potassium: 3.6 mmol/L (ref 3.5–5.1)
Sodium: 145 mmol/L (ref 135–145)
Total Bilirubin: 0.3 mg/dL (ref 0.3–1.2)
Total Protein: 7 g/dL (ref 6.5–8.1)

## 2017-06-30 LAB — CBC
HEMATOCRIT: 35.8 % — AB (ref 39.0–52.0)
HEMOGLOBIN: 10.2 g/dL — AB (ref 13.0–17.0)
MCH: 24 pg — ABNORMAL LOW (ref 26.0–34.0)
MCHC: 28.5 g/dL — ABNORMAL LOW (ref 30.0–36.0)
MCV: 84.2 fL (ref 78.0–100.0)
Platelets: 228 10*3/uL (ref 150–400)
RBC: 4.25 MIL/uL (ref 4.22–5.81)
RDW: 17.7 % — AB (ref 11.5–15.5)
WBC: 8.2 10*3/uL (ref 4.0–10.5)

## 2017-06-30 LAB — CARBOXYHEMOGLOBIN - COOX: Carboxyhemoglobin: 1.1 % (ref 0.5–1.5)

## 2017-06-30 MED ORDER — ACETAMINOPHEN 325 MG PO TABS
650.0000 mg | ORAL_TABLET | Freq: Four times a day (QID) | ORAL | Status: DC | PRN
  Filled 2017-06-30: qty 2

## 2017-06-30 MED ORDER — ONDANSETRON HCL 4 MG PO TABS: 4.0000 mg | ORAL_TABLET | Freq: Four times a day (QID) | ORAL | Status: DC | PRN

## 2017-06-30 MED ORDER — ONDANSETRON HCL 4 MG/2ML IJ SOLN: 4.0000 mg | Freq: Four times a day (QID) | INTRAMUSCULAR | Status: DC | PRN

## 2017-06-30 MED ORDER — SODIUM CHLORIDE 0.9 % IV BOLUS (SEPSIS)
500.0000 mL | Freq: Once | INTRAVENOUS | Status: AC
  Administered 2017-06-30: 500 mL via INTRAVENOUS

## 2017-06-30 MED ORDER — ACETAMINOPHEN 650 MG RE SUPP
650.0000 mg | Freq: Four times a day (QID) | RECTAL | Status: DC | PRN
  Filled 2017-06-30: qty 1

## 2017-06-30 MED ORDER — INSULIN ASPART 100 UNIT/ML ~~LOC~~ SOLN: 0.0000 [IU] | SUBCUTANEOUS | Status: DC

## 2017-06-30 NOTE — ED Provider Notes (Signed)
[facility]-EMERGENCY DEPT Provider Note   CSN: [id number] Arrival date & time: 06/30/17  1240     History   Chief Complaint Chief Complaint  Patient presents with  . Altered Mental Status    HPI Fernando [name] is a 72 y.o. male past medical history of CHF, dementia, diabetes who presents for evaluation of altered mental status.  Wife reports that since yesterday, patient has not been acting like himself.  She states that he has been seeing things that are not there.  Additionally, wife states that he will be asking questions that do not make sense.  Wife reports that patient had been diagnosed with dementia approximately 1 year ago.  She states that normally this manifest with him repeating questions.  She says normally he is not confused and knows where he is at.  Wife does report that patient started Namenda on 06/23/17.  White denies any fever, cough, chest pain, difficulty breathing, abdominal pain, nausea/vomiting.  The history is provided by the patient.    Past Medical History:  Diagnosis Date  . Arthritis   . CHF (congestive heart failure) (HCC)   . Dementia   . Diabetes mellitus without complication (HCC)   . Hypertension   . Renal disorder     Patient Active Problem List   Diagnosis Date Noted  . Acute respiratory failure with hypoxia and hypercapnia (HCC) 06/30/2017  . CKD (chronic kidney disease) stage 3, GFR 30-59 ml/min (HCC) 06/30/2017  . Dementia 06/30/2017  . Acute encephalopathy 06/30/2017  . Type 2 diabetes mellitus with renal complication (HCC) 06/30/2017  . Acute respiratory failure with hypoxia and hypercarbia (HCC) 06/30/2017  . Acute respiratory failure with hypoxia (HCC) 01/20/2017  . Acute on chronic diastolic CHF (congestive heart failure) (HCC)   . SVT (supraventricular tachycardia) (HCC)   . Elevated troponin   . Morbid obesity (HCC) 11/09/2016  . SOB (shortness of breath) 11/09/2016  . AKI (acute kidney injury) (HCC)  01/29/2016  . Single kidney 01/29/2016  . Renal insufficiency 01/29/2016  . HTN (hypertension) 01/29/2016  . Diabetes (HCC) 01/29/2016  . CHF (congestive heart failure) (HCC) 01/29/2016  . Hypoxia 01/29/2016  . Tachycardia 01/29/2016  . Hypotension 01/29/2016    Past Surgical History:  Procedure Laterality Date  . BACK SURGERY    . NEPHRECTOMY Left 1967       Home Medications    Prior to Admission medications   Medication Sig Start Date End Date Taking? Authorizing Provider  allopurinol (ZYLOPRIM) 100 MG tablet Take 100 mg by mouth daily with breakfast.   Yes [provider]  aspirin EC 81 MG tablet Take 81 mg by mouth every other day.   Yes [provider]  cholecalciferol (VITAMIN D) 1000 units tablet Take 1,000 Units by mouth daily with breakfast.    Yes [provider]  docusate sodium (COLACE) 50 MG capsule Take 50-100 mg by mouth 2 (two) times daily. 2 capsules in am and 1 capsule at night   Yes [provider]  donepezil (ARICEPT) 10 MG tablet Take 10 mg by mouth daily with breakfast.    Yes [provider]  finasteride (PROSCAR) 5 MG tablet Take 5 mg by mouth daily with breakfast.    Yes [provider]  glipiZIDE (GLUCOTROL) 5 MG tablet Take 5 mg by mouth 2 (two) times daily before a meal.   Yes [provider]  magnesium oxide (MAG-OX) 400 MG tablet Take 400 mg by mouth every Monday,  Wednesday, and Friday.    Yes [provider]  metoprolol tartrate (LOPRESSOR) 50 MG tablet Take 1 tablet (50 mg total) by mouth 2 (two) times daily. 02/06/17  Yes Jodelle Gross, NP  terazosin (HYTRIN) 5 MG capsule Take 5 mg by mouth daily with supper.    Yes [provider]  torsemide (DEMADEX) 20 MG tablet Take 40 mg (2 Tablet in the AM ) and 20 mg ( 1 Tablet in the PM) 02/09/17  Yes Jodelle Gross, NP  vitamin B-12 (CYANOCOBALAMIN) 1000 MCG tablet Take 1,000 mcg by mouth daily with supper.    Yes  [provider]    Family History Family History  Problem Relation Age of Onset  . Heart disease Mother     Social History Social History   Tobacco Use  . Smoking status: Former Smoker    Last attempt to quit: 05/30/1970    Years since quitting: 47.1  . Smokeless tobacco: Never Used  Substance Use Topics  . Alcohol use: No  . Drug use: No     Allergies   Ibuprofen   Review of Systems Review of Systems  Constitutional: Negative for chills and fever.  HENT: Negative for congestion.   Respiratory: Negative for cough and shortness of breath.   Cardiovascular: Negative for chest pain.  Gastrointestinal: Negative for abdominal pain, diarrhea, nausea and vomiting.  Genitourinary: Negative for dysuria and hematuria.  Musculoskeletal: Negative for back pain and neck pain.  Skin: Negative for rash.  Neurological: Negative for dizziness, weakness, numbness and headaches.  Psychiatric/Behavioral: Positive for confusion.  All other systems reviewed and are negative.    Physical Exam Updated Vital Signs BP 139/69   Pulse 93   Temp 97.9 F (36.6 C) (Oral)   Resp 17   SpO2 97%   Physical Exam  Constitutional: He is oriented to person, place, and time. He appears well-developed and well-nourished. He is sleeping.  Somnolent  HENT:  Head: Normocephalic and atraumatic.  Mouth/Throat: Oropharynx is clear and moist and mucous membranes are normal.  Eyes: Conjunctivae, EOM and lids are normal. Pupils are equal, round, and reactive to light.  Neck: Full passive range of motion without pain.  Cardiovascular: Normal rate, regular rhythm, normal heart sounds and normal pulses. Exam reveals no gallop and no friction rub.  No murmur heard. Pulses:      Radial pulses are 2+ on the right side, and 2+ on the left side.  Slightly decreased DP pulses bilaterally but palpable  Pulmonary/Chest: Effort normal and breath sounds normal.  Abdominal: Soft. Normal appearance. There is  no tenderness. There is no rigidity and no guarding.  Musculoskeletal: Normal range of motion.  Neurological: He is alert and oriented to person, place, and time.  Patient is somnolent but arousable by verbal stimuli.  He is able to answer questions.  He is alert and oriented x2, he can tell me his name and where he is at but when asked the year, he answers 1950  Patient will follow commands. 5/5 strength BUE and BLE Difficulty assessing finger to nose secondary to patient's cooperation  Skin: Skin is warm and dry. Capillary refill takes less than 2 seconds.  Chronic venous stasis changes noted to bilateral legs with overlying lichenification erythema.  No warmth or induration noted.  Wife says that this is chronic and not new. Good distal cap refill.   Psychiatric: He has a normal mood and affect. His speech is normal.  Nursing note and vitals  reviewed.    ED Treatments / Results  Labs (all labs ordered are listed, but only abnormal results are displayed) Labs Reviewed  COMPREHENSIVE METABOLIC PANEL - Abnormal; Notable for the following components:      Result Value   Chloride 99 (*)    CO2 37 (*)    Glucose, Bld 106 (*)    BUN 34 (*)    Creatinine, Ser 2.44 (*)    Calcium 8.7 (*)    Albumin 3.2 (*)    AST 14 (*)    ALT 9 (*)    GFR calc non Af Amer 25 (*)    GFR calc Af Amer 29 (*)    All other components within normal limits  CBC - Abnormal; Notable for the following components:   Hemoglobin 10.2 (*)    HCT 35.8 (*)    MCH 24.0 (*)    MCHC 28.5 (*)    RDW 17.7 (*)    All other components within normal limits  BLOOD GAS, ARTERIAL - Abnormal; Notable for the following components:   pH, Arterial 7.284 (*)    pCO2 arterial 88.7 (*)    pO2, Arterial 66.8 (*)    Bicarbonate 40.7 (*)    Acid-Base Excess 11.6 (*)    All other components within normal limits  CBG MONITORING, ED - Abnormal; Notable for the following components:   Glucose-Capillary 122 (*)    All other  components within normal limits  CBG MONITORING, ED - Abnormal; Notable for the following components:   Glucose-Capillary 105 (*)    All other components within normal limits  URINALYSIS, ROUTINE W REFLEX MICROSCOPIC  BLOOD GAS, ARTERIAL  CARBON MONOXIDE, BLOOD (PERFORMED AT REF LAB)  BASIC METABOLIC PANEL  CBC  TSH  HEPATIC FUNCTION PANEL  TROPONIN I  TROPONIN I  TROPONIN I  AMMONIA    EKG  EKG Interpretation  Date/Time:  Friday June 30 2017 12:53:05 EST Ventricular Rate:  62 PR Interval:    QRS Duration: 100 QT Interval:  426 QTC Calculation: 419 R Axis:   90 Text Interpretation:  Sinus rhythm Borderline right axis deviation Low voltage, precordial leads Nonspecific T abnrm, anterolateral leads Baseline wander in lead(s) V6 no significant change compared to Aug 2018 Confirmed by Pricilla Loveless [phone]) on 06/30/2017 12:56:24 PM       Radiology Dg Chest 2 View  Result Date: 06/30/2017 CLINICAL DATA:  Altered mental status.  Hypoxia. EXAM: CHEST  2 VIEW COMPARISON:  01/24/2017 FINDINGS: Low lung volumes. Cardiomegaly. Bibasilar scarring or subsegmental atelectasis. No definite consolidation or edema. No pneumothorax. Bones grossly unremarkable. IMPRESSION: Low lung volumes. No definite active infiltrates or failure. Cardiomegaly. Electronically Signed   By: Elsie Stain M.D.   On: 06/30/2017 14:22   Ct Head Wo Contrast  Result Date: 06/30/2017 CLINICAL DATA:  Wife verbalizes pt "not acting like self; seeing things that aren't there; and asking if we are home when we are already there" since yesterday afternoon around 2. Pt denies weakness, numbness, or pain. Dementia. EXAM: CT HEAD WITHOUT CONTRAST TECHNIQUE: Contiguous axial images were obtained from the base of the skull through the vertex without intravenous contrast. COMPARISON:  07/16/2009 FINDINGS: Brain: There is moderate central and cortical atrophy. Periventricular white matter changes are consistent with small vessel  disease. There is no intra or extra-axial fluid collection or mass lesion. The basilar cisterns and ventricles have a normal appearance. There is no CT evidence for acute infarction or hemorrhage. Vascular: No hyperdense vessel or  unexpected calcification. Skull: Normal. Negative for fracture or focal lesion. Sinuses/Orbits: No acute finding. Other: None IMPRESSION: 1. Atrophy and small vessel disease. 2.  No evidence for acute intracranial abnormality. Electronically Signed   By: Norva Pavlov M.D.   On: 06/30/2017 16:32    Procedures Procedures (including critical care time)  Medications Ordered in ED Medications  acetaminophen (TYLENOL) tablet 650 mg (not administered)    Or  acetaminophen (TYLENOL) suppository 650 mg (not administered)  ondansetron (ZOFRAN) tablet 4 mg (not administered)    Or  ondansetron (ZOFRAN) injection 4 mg (not administered)  insulin aspart (novoLOG) injection 0-9 Units (0 Units Subcutaneous Not Given 06/30/17 2230)  sodium chloride 0.9 % bolus 500 mL (500 mLs Intravenous New Bag/Given 06/30/17 1854)     Initial Impression / Assessment and Plan / ED Course  I have reviewed the triage vital signs and the nursing notes.  Pertinent labs & imaging results that were available during my care of the patient were reviewed by me and considered in my medical decision making (see chart for details).     71 y.o. male who presents for evaluation of altered mental status.  Wife reports that since yesterday, patient has not been acting appropriately.  She states that he has been talking about things that are not relevant and has been saying that he is seeing things.  Additionally, wife reports the patient has been more sleepy than normal.  She reports that yesterday he slept almost the entire day.  No fevers noted.  Patient has been eating and drinking appropriately.  On ED arrival, patient is afebrile.  Initial O2 sat showed 78% on room air.  Patient does not have any oxygen  requirement at home.  Patient was immediately placed on 2 L O2 which had improvement in O2 sats up to 94-96%.  During my evaluation, patient would occasionally drop to 91% on 2 L O2.  Lungs clear to auscultation.  No abdominal tenderness.  Patient does have diffuse erythema and lichenification of bilateral lower extremities.  Wife states that this is normal. No evidence of sores, ulcers, abrasions.  During my evaluation, patient is very somnolent.  He will keep nodding off to sleep.  He is arousable by verbal stimuli.  And will answer questions appropriately.  Consider acute infectious etiology versus electrolyte imbalance.  Plan for basic labs, chest x-ray, CT head.  Labs and imaging reviewed.  CMP shows bicarb of 37.  BUN and creatinine are elevated.  Records show that this consistent with previous.  CBC shows no leukocytosis.  Patient does have a baseline history of anemia and appears consistent with previous.  His MCV is low which appears to be what had previously caused his anemia.  Given somnolence, will plan to do ABG.  CT head is without any intracranial abnormalities.  Chest x-ray negative for any infiltrates.  ABG shows a pH of 7.284.  PCO2 is 88.7.  Bicarb is 40.7.  Given concerns for respiratory acidosis and hypercapnia, will plan to start patient on BiPAP.  Reevaluation after BiPAP placement.  Patient is slightly more alert.  He is responsive to questions.  Repeat ABG after BiPAP 1 hr:  PCO2: 80.1, Bicarb: 28, pH: 7.308  Given patient's hypercapnia, altered mental status and presentation, plan for admission for respiratory acidosis.  Discussed patient with Dr. Lars Masson (hospitalist). Will admit.   Final Clinical Impressions(s) / ED Diagnoses   Final diagnoses:  Altered mental status, unspecified altered mental status type  Hypercapnia  ED Discharge Orders    None       Rosana HoesLayden, Lindsey A, PA-C 06/30/17 [id number]    Campos, Kevin, MD 07/02/17 [phone]

## 2017-06-30 NOTE — ED Notes (Signed)
Attempt to draw labs from left hand unsuccessful.

## 2017-06-30 NOTE — ED Notes (Signed)
Pt family is continuously misunderstanding that the in and out cath did not return any urine. Says that we must have dropped it in the bed. She continues to state that "you're doing all of this unnecessary stuff (COPD) when you need to get his urine! Why do you keep losing it (urine)?"

## 2017-06-30 NOTE — ED Triage Notes (Signed)
Wife verbalizes pt "not acting like self; seeing things that aren't there; and asking if we are home when we are already there" since yesterday afternoon around 2. Pt denies weakness, numbness, or pain. Wife/pt denies recent sickness. Pt alert and oriented to self, place, situation, but not time (normal per wife). Hx of dementia diagnosed a year ago. Wife continues verbalizing concern for UTI.

## 2017-06-30 NOTE — ED Notes (Signed)
No urine return from catheter

## 2017-06-30 NOTE — H&P (Addendum)
History and Physical    Fernando [name]:[id number] DOB: [birth year] DOA: 06/30/2017  [facility]P: [facility]  Patient coming from: Home.  Chief Complaint: Increasing confusion and hallucination.  HPI: Fernando [name] is a 72 y.o. male with history of diabetes mellitus type 2, CHF, hypertension, chronic kidney disease, morbid obesity who was recently diagnosed with dementia and was placed on Aricept 4 months ago and was recently started on Namenda last month started experiencing increasing hallucinations yesterday afternoon.  Following which patient became progressively confused and drowsy.  This afternoon since patient's symptoms did not improve was brought to the ER.  ED Course: In the ER patient is found to be drowsy with ABG showing hypercarbic respiratory failure.  Patient was placed on BiPAP.  CT head was unremarkable.  Patient is afebrile.  UA does not show any signs of infection.  Chest x-ray was unremarkable.  Patient as per the patient's wife was at the bedside is DNR.  Patient admitted for further observation.  Review of Systems: As per HPI, rest all negative.   Past Medical History:  Diagnosis Date  . Arthritis   . CHF (congestive heart failure) (HCC)   . Dementia   . Diabetes mellitus without complication (HCC)   . Hypertension   . Renal disorder     Past Surgical History:  Procedure Laterality Date  . BACK SURGERY    . NEPHRECTOMY Left 1967     reports that he quit smoking about 47 years ago. he has never used smokeless tobacco. He reports that he does not drink alcohol or use drugs.  Allergies  Allergen Reactions  . Ibuprofen Other (See Comments)    Due to one kidney    Family History  Problem Relation Age of Onset  . Heart disease Mother     Prior to Admission medications   Medication Sig Start Date End Date Taking? Authorizing Provider  allopurinol (ZYLOPRIM) 100 MG tablet Take 100 mg by mouth daily with breakfast.   Yes [provider]  aspirin EC 81 MG tablet Take 81 mg by mouth every other day.   Yes [provider]  cholecalciferol (VITAMIN D) 1000 units tablet Take 1,000 Units by mouth daily with breakfast.    Yes [provider]  docusate sodium (COLACE) 50 MG capsule Take 50-100 mg by mouth 2 (two) times daily. 2 capsules in am and 1 capsule at night   Yes [provider]  donepezil (ARICEPT) 10 MG tablet Take 10 mg by mouth daily with breakfast.    Yes [provider]  finasteride (PROSCAR) 5 MG tablet Take 5 mg by mouth daily with breakfast.    Yes [provider]  glipiZIDE (GLUCOTROL) 5 MG tablet Take 5 mg by mouth 2 (two) times daily before a meal.   Yes [provider]  magnesium oxide (MAG-OX) 400 MG tablet Take 400 mg by mouth every Monday, Wednesday, and Friday.    Yes [provider]  metoprolol tartrate (LOPRESSOR) 50 MG tablet Take 1 tablet (50 mg total) by mouth 2 (two) times daily. 02/06/17  Yes Jodelle Gross, NP  terazosin (HYTRIN) 5 MG capsule Take 5 mg by mouth daily with supper.    Yes [provider]  torsemide (DEMADEX) 20 MG tablet Take 40 mg (2 Tablet in the AM ) and 20 mg ( 1 Tablet in the PM) 02/09/17  Yes Jodelle Gross, NP  vitamin B-12 (CYANOCOBALAMIN) 1000 MCG tablet Take 1,000  mcg by mouth daily with supper.    Yes [provider]    Physical Exam: Vitals:   06/30/17 1700 06/30/17 1838 06/30/17 2013 06/30/17 2033  BP: 130/69  (!) 142/93   Pulse: (!) 52 90 83 84  Resp: 16  16 17   Temp:      TempSrc:      SpO2: 93% 100% 97% 97%      Constitutional: Moderately built and nourished. Vitals:   06/30/17 1700 06/30/17 1838 06/30/17 2013 06/30/17 2033  BP: 130/69  (!) 142/93   Pulse: (!) 52 90 83 84  Resp: 16  16 17   Temp:      TempSrc:      SpO2: 93% 100% 97% 97%   Eyes: Anicteric no pallor. ENMT: No discharge from the ears eyes nose or mouth. Neck: No JVD appreciated.  No neck  stiffness. Respiratory: No rhonchi or crepitations. Cardiovascular: S1-S2 heard no murmurs appreciated. Abdomen: Soft nontender bowel sounds present. Musculoskeletal: Bilateral lower extremity edema which is chronic.  Chronic skin changes. Skin: Chronic skin changes of the lower extremities. Neurologic: Patient is confused only oriented to his name.  Pupils are reacting to light.  Moves all extremities. Psychiatric: Appears confused.   Labs on Admission: I have personally reviewed following labs and imaging studies  CBC: Recent Labs  Lab 06/30/17 1441  WBC 8.2  HGB 10.2*  HCT 35.8*  MCV 84.2  PLT 228   Basic Metabolic Panel: Recent Labs  Lab 06/30/17 1441  NA 145  K 3.6  CL 99*  CO2 37*  GLUCOSE 106*  BUN 34*  CREATININE 2.44*  CALCIUM 8.7*   GFR: CrCl cannot be calculated (Unknown ideal weight.). Liver Function Tests: Recent Labs  Lab 06/30/17 1441  AST 14*  ALT 9*  ALKPHOS 49  BILITOT 0.3  PROT 7.0  ALBUMIN 3.2*   No results for input(s): LIPASE, AMYLASE in the last 168 hours. No results for input(s): AMMONIA in the last 168 hours. Coagulation Profile: No results for input(s): INR, PROTIME in the last 168 hours. Cardiac Enzymes: No results for input(s): CKTOTAL, CKMB, CKMBINDEX, TROPONINI in the last 168 hours. BNP (last 3 results) No results for input(s): PROBNP in the last 8760 hours. HbA1C: No results for input(s): HGBA1C in the last 72 hours. CBG: Recent Labs  Lab 06/30/17 1246  GLUCAP 122*   Lipid Profile: No results for input(s): CHOL, HDL, LDLCALC, TRIG, CHOLHDL, LDLDIRECT in the last 72 hours. Thyroid Function Tests: No results for input(s): TSH, T4TOTAL, FREET4, T3FREE, THYROIDAB in the last 72 hours. Anemia Panel: No results for input(s): VITAMINB12, FOLATE, FERRITIN, TIBC, IRON, RETICCTPCT in the last 72 hours. Urine analysis:    Component Value Date/Time   COLORURINE STRAW (A) 11/18/2016 1340   APPEARANCEUR CLEAR 11/18/2016 1340     LABSPEC 1.005 11/18/2016 1340   PHURINE 6.0 11/18/2016 1340   GLUCOSEU NEGATIVE 11/18/2016 1340   HGBUR NEGATIVE 11/18/2016 1340   BILIRUBINUR NEGATIVE 11/18/2016 1340   KETONESUR NEGATIVE 11/18/2016 1340   PROTEINUR NEGATIVE 11/18/2016 1340   UROBILINOGEN 0.2 07/15/2009 2249   NITRITE NEGATIVE 11/18/2016 1340   LEUKOCYTESUR NEGATIVE 11/18/2016 1340   Sepsis Labs: @LABRCNTIP (procalcitonin:4,lacticidven:4) )No results found for this or any previous visit (from the past 240 hour(s)).   Radiological Exams on Admission: Dg Chest 2 View  Result Date: 06/30/2017 CLINICAL DATA:  Altered mental status.  Hypoxia. EXAM: CHEST  2 VIEW COMPARISON:  01/24/2017 FINDINGS: Low lung volumes. Cardiomegaly. Bibasilar scarring or subsegmental atelectasis.  No definite consolidation or edema. No pneumothorax. Bones grossly unremarkable. IMPRESSION: Low lung volumes. No definite active infiltrates or failure. Cardiomegaly. Electronically Signed   By: Elsie Stain M.D.   On: 06/30/2017 14:22   Ct Head Wo Contrast  Result Date: 06/30/2017 CLINICAL DATA:  Wife verbalizes pt "not acting like self; seeing things that aren't there; and asking if we are home when we are already there" since yesterday afternoon around 2. Pt denies weakness, numbness, or pain. Dementia. EXAM: CT HEAD WITHOUT CONTRAST TECHNIQUE: Contiguous axial images were obtained from the base of the skull through the vertex without intravenous contrast. COMPARISON:  07/16/2009 FINDINGS: Brain: There is moderate central and cortical atrophy. Periventricular white matter changes are consistent with small vessel disease. There is no intra or extra-axial fluid collection or mass lesion. The basilar cisterns and ventricles have a normal appearance. There is no CT evidence for acute infarction or hemorrhage. Vascular: No hyperdense vessel or unexpected calcification. Skull: Normal. Negative for fracture or focal lesion. Sinuses/Orbits: No acute finding. Other:  None IMPRESSION: 1. Atrophy and small vessel disease. 2.  No evidence for acute intracranial abnormality. Electronically Signed   By: Norva Pavlov M.D.   On: 06/30/2017 16:32    EKG: Independently reviewed.  Normal sinus rhythm.  Assessment/Plan Active Problems:   HTN (hypertension)   Morbid obesity (HCC)   Acute respiratory failure with hypoxia and hypercapnia (HCC)   CKD (chronic kidney disease) stage 3, GFR 30-59 ml/min (HCC)   Dementia   Acute encephalopathy   Type 2 diabetes mellitus with renal complication (HCC)   Acute respiratory failure with hypoxia and hypercarbia (HCC)    1. Acute respiratory failure with hypoxia and hypercarbia -cause not clear.  Could be from obesity hypoventilation syndrome.  If symptoms does not get better will get MRI brain.  Check ammonia levels.  Patient is afebrile.  Consult palliative care if does not improve.  Patient is a DNR.  Did discuss with pulmonologist. 2. Diabetes mellitus type 2 -we will keep patient on sliding scale coverage until patient becomes alert awake. 3. Dementia recently started on Aricept and Namenda which can be continued once patient is alert awake. 4. Hypertension for now patient is on PRN IV hydralazine until patient can take oral medications. 5. Chronic kidney disease stage IV creatinine appears to be at baseline. 6. Morbid obesity. 7. History of chronic CHF.  If patient cannot take oral diuretics may start IV diuretics if patient does not become more alert awake.   DVT prophylaxis: SCDs.  If no procedures anticipated change to Lovenox. Code Status: DNR. Family Communication: Patient's wife. Disposition Plan: Skilled nursing facility. Consults called: Discussed with pulmonologist. Admission status: Inpatient.   Eduard Clos MD [facility] Pager [phone].  If 7PM-7AM, please contact night-coverage [email or web address] Password [facility]  06/30/2017, 9:54 PM

## 2017-06-30 NOTE — ED Notes (Signed)
Pt has condom cath in place. 

## 2017-07-01 ENCOUNTER — Other Ambulatory Visit: Payer: Self-pay

## 2017-07-01 DIAGNOSIS — J9692 Respiratory failure, unspecified with hypercapnia: Secondary | ICD-10-CM | POA: Diagnosis present

## 2017-07-01 DIAGNOSIS — R4182 Altered mental status, unspecified: Secondary | ICD-10-CM

## 2017-07-01 LAB — GLUCOSE, CAPILLARY
Glucose-Capillary: 80 mg/dL (ref 65–99)
Glucose-Capillary: 123 mg/dL — ABNORMAL HIGH (ref 65–99)

## 2017-07-01 LAB — URINALYSIS, ROUTINE W REFLEX MICROSCOPIC
Bacteria, UA: NONE SEEN
Bilirubin Urine: NEGATIVE
Glucose, UA: 50 mg/dL — AB
Hgb urine dipstick: NEGATIVE
Ketones, ur: NEGATIVE mg/dL
Leukocytes, UA: NEGATIVE
Nitrite: NEGATIVE
Protein, ur: 100 mg/dL — AB
SPECIFIC GRAVITY, URINE: 1.01 (ref 1.005–1.030)
pH: 5 (ref 5.0–8.0)

## 2017-07-01 LAB — CBG MONITORING, ED
GLUCOSE-CAPILLARY: 120 mg/dL — AB (ref 65–99)
GLUCOSE-CAPILLARY: 114 mg/dL — AB (ref 65–99)
Glucose-Capillary: 78 mg/dL (ref 65–99)
Glucose-Capillary: 74 mg/dL (ref 65–99)

## 2017-07-01 LAB — BASIC METABOLIC PANEL
Anion gap: 9 (ref 5–15)
BUN: 32 mg/dL — ABNORMAL HIGH (ref 6–20)
CHLORIDE: 100 mmol/L — AB (ref 101–111)
CO2: 38 mmol/L — ABNORMAL HIGH (ref 22–32)
CREATININE: 2.34 mg/dL — AB (ref 0.61–1.24)
Calcium: 8.8 mg/dL — ABNORMAL LOW (ref 8.9–10.3)
GFR calc non Af Amer: 26 mL/min — ABNORMAL LOW (ref >60)
GFR, EST AFRICAN AMERICAN: 30 mL/min — AB (ref >60)
Glucose, Bld: 102 mg/dL — ABNORMAL HIGH (ref 65–99)
POTASSIUM: 4.4 mmol/L (ref 3.5–5.1)
SODIUM: 147 mmol/L — AB (ref 135–145)

## 2017-07-01 LAB — TROPONIN I
TROPONIN I: 0.1 ng/mL — AB (ref <0.03)
Troponin I: 0.1 ng/mL (ref <0.03)
Troponin I: 0.09 ng/mL (ref <0.03)

## 2017-07-01 LAB — CBC
HEMATOCRIT: 37.3 % — AB (ref 39.0–52.0)
Hemoglobin: 10.3 g/dL — ABNORMAL LOW (ref 13.0–17.0)
MCH: 24 pg — ABNORMAL LOW (ref 26.0–34.0)
MCHC: 27.6 g/dL — ABNORMAL LOW (ref 30.0–36.0)
MCV: 86.7 fL (ref 78.0–100.0)
PLATELETS: 246 10*3/uL (ref 150–400)
RBC: 4.3 MIL/uL (ref 4.22–5.81)
RDW: 17.6 % — ABNORMAL HIGH (ref 11.5–15.5)
WBC: 8.6 10*3/uL (ref 4.0–10.5)

## 2017-07-01 LAB — HEPATIC FUNCTION PANEL
ALBUMIN: 3.2 g/dL — AB (ref 3.5–5.0)
ALT: 12 U/L — ABNORMAL LOW (ref 17–63)
AST: 17 U/L (ref 15–41)
Alkaline Phosphatase: 48 U/L (ref 38–126)
BILIRUBIN TOTAL: 0.6 mg/dL (ref 0.3–1.2)
Bilirubin, Direct: <0.1 mg/dL — ABNORMAL LOW (ref 0.1–0.5)
Total Protein: 7.2 g/dL (ref 6.5–8.1)

## 2017-07-01 LAB — TSH: TSH: 2.554 u[IU]/mL (ref 0.350–4.500)

## 2017-07-01 LAB — AMMONIA: AMMONIA: 29 umol/L (ref 9–35)

## 2017-07-01 MED ORDER — METOPROLOL TARTRATE 25 MG PO TABS
50.0000 mg | ORAL_TABLET | Freq: Two times a day (BID) | ORAL | Status: DC
Start: 2017-07-01 — End: 2017-07-02
  Administered 2017-07-01 – 2017-07-02 (×2): 50 mg via ORAL
  Filled 2017-07-01 (×2): qty 2

## 2017-07-01 MED ORDER — DOCUSATE SODIUM 100 MG PO CAPS
100.0000 mg | ORAL_CAPSULE | Freq: Every day | ORAL | Status: DC
  Administered 2017-07-02: 100 mg via ORAL
  Filled 2017-07-01: qty 1

## 2017-07-01 MED ORDER — ALLOPURINOL 100 MG PO TABS
100.0000 mg | ORAL_TABLET | Freq: Every day | ORAL | Status: DC
  Administered 2017-07-02: 100 mg via ORAL
  Filled 2017-07-01: qty 1

## 2017-07-01 MED ORDER — ASPIRIN EC 81 MG PO TBEC
81.0000 mg | DELAYED_RELEASE_TABLET | ORAL | Status: DC
  Administered 2017-07-01: 81 mg via ORAL
  Filled 2017-07-01: qty 1

## 2017-07-01 MED ORDER — INFLUENZA VAC SPLIT HIGH-DOSE 0.5 ML IM SUSY
0.5000 mL | PREFILLED_SYRINGE | INTRAMUSCULAR | Status: AC
  Administered 2017-07-03: 0.5 mL via INTRAMUSCULAR
  Filled 2017-07-01 (×2): qty 0.5

## 2017-07-01 MED ORDER — TORSEMIDE 20 MG PO TABS
40.0000 mg | ORAL_TABLET | Freq: Every day | ORAL | Status: DC
  Administered 2017-07-02: 40 mg via ORAL
  Filled 2017-07-01: qty 2

## 2017-07-01 MED ORDER — DOCUSATE SODIUM 50 MG PO CAPS: 50.0000 mg | ORAL_CAPSULE | Freq: Two times a day (BID) | ORAL | Status: DC

## 2017-07-01 MED ORDER — INSULIN ASPART 100 UNIT/ML ~~LOC~~ SOLN
0.0000 [IU] | Freq: Three times a day (TID) | SUBCUTANEOUS | Status: DC
  Administered 2017-07-02: 2 [IU] via SUBCUTANEOUS
  Administered 2017-07-02: 3 [IU] via SUBCUTANEOUS
  Administered 2017-07-03 – 2017-07-04 (×2): 2 [IU] via SUBCUTANEOUS
  Administered 2017-07-05: 3 [IU] via SUBCUTANEOUS
  Administered 2017-07-05: 2 [IU] via SUBCUTANEOUS
  Administered 2017-07-06: 3 [IU] via SUBCUTANEOUS
  Administered 2017-07-06: 2 [IU] via SUBCUTANEOUS
  Administered 2017-07-07: 3 [IU] via SUBCUTANEOUS
  Administered 2017-07-07 – 2017-07-10 (×5): 2 [IU] via SUBCUTANEOUS
  Administered 2017-07-10: 1 [IU] via SUBCUTANEOUS
  Administered 2017-07-10: 2 [IU] via SUBCUTANEOUS

## 2017-07-01 MED ORDER — TORSEMIDE 20 MG PO TABS
20.0000 mg | ORAL_TABLET | Freq: Every evening | ORAL | Status: DC
  Administered 2017-07-01: 20 mg via ORAL
  Filled 2017-07-01 (×2): qty 1

## 2017-07-01 MED ORDER — DOCUSATE SODIUM 50 MG PO CAPS
50.0000 mg | ORAL_CAPSULE | Freq: Every day | ORAL | Status: DC
  Administered 2017-07-01: 50 mg via ORAL
  Filled 2017-07-01 (×2): qty 1

## 2017-07-01 MED ORDER — INSULIN ASPART 100 UNIT/ML ~~LOC~~ SOLN: 0.0000 [IU] | Freq: Every day | SUBCUTANEOUS | Status: DC

## 2017-07-01 MED ORDER — TERAZOSIN HCL 5 MG PO CAPS
5.0000 mg | ORAL_CAPSULE | Freq: Every day | ORAL | Status: DC
  Administered 2017-07-01: 5 mg via ORAL
  Filled 2017-07-01 (×2): qty 1

## 2017-07-01 MED ORDER — FINASTERIDE 5 MG PO TABS
5.0000 mg | ORAL_TABLET | Freq: Every day | ORAL | Status: DC
  Administered 2017-07-02: 5 mg via ORAL
  Filled 2017-07-01: qty 1

## 2017-07-01 MED ORDER — HYDRALAZINE HCL 20 MG/ML IJ SOLN: 10.0000 mg | INTRAMUSCULAR | Status: DC | PRN

## 2017-07-01 NOTE — ED Notes (Signed)
ED TO INPATIENT HANDOFF REPORT  Name/Age/Gender Fernando [name] 72 y.o. male  Code Status    Code Status Orders  (From admission, onward)        Start     Ordered   06/30/17 2150  Do not attempt resuscitation (DNR)  Continuous    Question Answer Comment  In the event of cardiac or respiratory ARREST Do not call a "code blue"   In the event of cardiac or respiratory ARREST Do not perform Intubation, CPR, defibrillation or ACLS   In the event of cardiac or respiratory ARREST Use medication by any route, position, wound care, and other measures to relive pain and suffering. May use oxygen, suction and manual treatment of airway obstruction as needed for comfort.      06/30/17 2151    Code Status History    Date Active Date Inactive Code Status Order ID Comments User Context   01/20/2017 15:44 01/26/2017 15:58 Full Code [id number]  Deneise Lever, MD ED   11/18/2016 17:13 11/20/2016 15:26 Full Code [id number]  Verlee Monte, MD Inpatient   11/09/2016 13:54 11/15/2016 16:54 Full Code [id number]  Bonnielee Haff, MD Inpatient   01/30/2016 01:19 02/01/2016 17:37 Full Code [id number]  Lily Kocher, MD Inpatient      Home/SNF/Other Home  Chief Complaint Speech not making sense; Hullucinations  Level of Care/Admitting Diagnosis ED Disposition    ED Disposition Condition [place]: [place] [[id number]]  Level of Care: Telemetry [5]  Admit to tele based on following criteria: Complex arrhythmia (Bradycardia/Tachycardia)  Diagnosis: Hypercapnic respiratory failure [facility]) [[id number]]  Admitting Physician: Velta Addison  Attending Physician: Donne Hazel [[id number]]  Estimated length of stay: 3 - 4 days  Certification:: I certify this patient will need inpatient services for at least 2 midnights  PT Class (Do Not Modify): Inpatient [101]  PT Acc Code (Do Not Modify): Private [1]       Medical History Past Medical History:  Diagnosis Date   . Arthritis   . CHF (congestive heart failure) ([place])   . Dementia   . Diabetes mellitus without complication ([place])   . Hypertension   . Renal disorder     Allergies Allergies  Allergen Reactions  . Ibuprofen Other (See Comments)    Due to one kidney    IV Location/Drains/Wounds Patient Lines/Drains/Airways Status   Active Line/Drains/Airways    None          Labs/Imaging Results for orders placed or performed during the hospital encounter of 06/30/17 (from the past 48 hour(s))  CBG monitoring, ED     Status: Abnormal   Collection Time: 06/30/17 12:46 PM  Result Value Ref Range   Glucose-Capillary 122 (H) 65 - 99 mg/dL  Comprehensive metabolic panel     Status: Abnormal   Collection Time: 06/30/17  2:41 PM  Result Value Ref Range   Sodium 145 135 - 145 mmol/L   Potassium 3.6 3.5 - 5.1 mmol/L   Chloride 99 (L) 101 - 111 mmol/L   CO2 37 (H) 22 - 32 mmol/L   Glucose, Bld 106 (H) 65 - 99 mg/dL   BUN 34 (H) 6 - 20 mg/dL   Creatinine, Ser 2.44 (H) 0.61 - 1.24 mg/dL   Calcium 8.7 (L) 8.9 - 10.3 mg/dL   Total Protein 7.0 6.5 - 8.1 g/dL   Albumin 3.2 (L) 3.5 - 5.0 g/dL   AST 14 (L) 15 - 41 U/L  ALT 9 (L) 17 - 63 U/L   Alkaline Phosphatase 49 38 - 126 U/L   Total Bilirubin 0.3 0.3 - 1.2 mg/dL   GFR calc non Af Amer 25 (L) >60 mL/min   GFR calc Af Amer 29 (L) >60 mL/min    Comment: (NOTE) The eGFR has been calculated using the CKD EPI equation. This calculation has not been validated in all clinical situations. eGFR's persistently <60 mL/min signify possible Chronic Kidney Disease.    Anion gap 9 5 - 15    Comment: Performed at [facility], [place] [address] [place], [place] [address]  CBC     Status: Abnormal   Collection Time: 06/30/17  2:41 PM  Result Value Ref Range   WBC 8.2 4.0 - 10.5 K/uL   RBC 4.25 4.22 - 5.81 MIL/uL   Hemoglobin 10.2 (L) 13.0 - 17.0 g/dL   HCT 35.8 (L) 39.0 - 52.0 %   MCV 84.2 78.0 - 100.0 fL   MCH 24.0 (L) 26.0 -  34.0 pg   MCHC 28.5 (L) 30.0 - 36.0 g/dL   RDW 17.7 (H) 11.5 - 15.5 %   Platelets 228 150 - 400 K/uL    Comment: Performed at [facility], [place] [address] [place], [place] [address]  Blood gas, arterial [facility] & [facility] ONLY)     Status: Abnormal   Collection Time: 06/30/17  4:45 PM  Result Value Ref Range   O2 Content 2.0 L/min   Delivery systems NASAL CANNULA    pH, Arterial 7.284 (L) 7.350 - 7.450   pCO2 arterial 88.7 (HH) 32.0 - 48.0 mmHg    Comment: RBV LINDSEY LAYDEN,PA AT 1700 BY KENNAN DEPUE,RRT,RCP ON 06/30/2017   pO2, Arterial 66.8 (L) 83.0 - 108.0 mmHg   Bicarbonate 40.7 (H) 20.0 - 28.0 mmol/L   Acid-Base Excess 11.6 (H) 0.0 - 2.0 mmol/L   O2 Saturation 88.9 %   Patient temperature 98.6    Collection site RIGHT RADIAL    Drawn by [id number]    Sample type ARTERIAL DRAW    Allens test (pass/fail) PASS PASS    Comment: Performed at [facility], [place] [address], [place], [place] [address]  Blood gas, arterial ([facility] & [facility] ONLY)     Status: Abnormal (Preliminary result)   Collection Time: 06/30/17  8:00 PM  Result Value Ref Range   FIO2 0.50    O2 Content PENDING L/min   Delivery systems BILEVEL POSITIVE AIRWAY PRESSURE    LHR 14 resp/min   Inspiratory PAP 14    Expiratory PAP 6    pH, Arterial 7.308 (L) 7.350 - 7.450   pCO2 arterial 80.1 (HH) 32.0 - 48.0 mmHg    Comment: CRITICAL RESULT CALLED TO, READ BACK BY AND VERIFIED WITH: LINDSAY LAYDEN PA BY KELLY JARRELL, RRT RCP 06/30/2017 AT 2011    pO2, Arterial 107 83.0 - 108.0 mmHg   Bicarbonate 39.0 (H) 20.0 - 28.0 mmol/L   Acid-Base Excess 10.6 (H) 0.0 - 2.0 mmol/L   O2 Saturation 97.1 %   Patient temperature 98.6    Collection site LEFT RADIAL    Drawn by [id number]    Sample type ARTERIAL DRAW    Allens test (pass/fail) PASS PASS    Comment: Performed at [facility], [place] [address] [place], [place] [address]  Carboxyhemoglobin (single result)     Status: None   Collection  Time: 06/30/17  8:04 PM  Result Value Ref Range  Carboxyhemoglobin 1.1 0.5 - 1.5 %    Comment: Performed at [facility], [place] [address] [place], [place] [address]  CBG monitoring, ED     Status: Abnormal   Collection Time: 06/30/17 10:15 PM  Result Value Ref Range   Glucose-Capillary 105 (H) 65 - 99 mg/dL   Comment 1 Notify RN   Urinalysis, Routine w reflex microscopic     Status: Abnormal   Collection Time: 07/01/17 12:12 AM  Result Value Ref Range   Color, Urine YELLOW YELLOW   APPearance CLEAR CLEAR   Specific Gravity, Urine 1.010 1.005 - 1.030   pH 5.0 5.0 - 8.0   Glucose, UA 50 (A) NEGATIVE mg/dL   Hgb urine dipstick NEGATIVE NEGATIVE   Bilirubin Urine NEGATIVE NEGATIVE   Ketones, ur NEGATIVE NEGATIVE mg/dL   Protein, ur 100 (A) NEGATIVE mg/dL   Nitrite NEGATIVE NEGATIVE   Leukocytes, UA NEGATIVE NEGATIVE   RBC / HPF 0-5 0 - 5 RBC/hpf   WBC, UA 0-5 0 - 5 WBC/hpf   Bacteria, UA NONE SEEN NONE SEEN   Squamous Epithelial / LPF 0-5 (A) NONE SEEN   Mucus PRESENT    Hyaline Casts, UA PRESENT     Comment: Performed at [facility], [place] [address], [place], [place] [address]  TSH     Status: None   Collection Time: 07/01/17 12:12 AM  Result Value Ref Range   TSH 2.554 0.350 - 4.500 uIU/mL    Comment: Performed by a 3rd Generation assay with a functional sensitivity of <=0.01 uIU/mL. Performed at [facility], [place] [address] [place], [place] [address]   Troponin I     Status: Abnormal   Collection Time: 07/01/17 12:12 AM  Result Value Ref Range   Troponin I 0.10 (HH) <0.03 ng/mL    Comment: CRITICAL RESULT CALLED TO, READ BACK BY AND VERIFIED WITH: S OXENDINE,RN '@[id number]'$  07/01/17 MKELLY Performed at [facility], [place] [address] [place], [place] [address]   Ammonia     Status: None   Collection Time: 07/01/17 12:12 AM  Result Value Ref Range   Ammonia 29 9 - 35 umol/L    Comment: Performed at  [facility], [place] [address], [place], [place] [address]  CBG monitoring, ED     Status: Abnormal   Collection Time: 07/01/17  1:28 AM  Result Value Ref Range   Glucose-Capillary 120 (H) 65 - 99 mg/dL  Basic metabolic panel     Status: Abnormal   Collection Time: 07/01/17  4:51 AM  Result Value Ref Range   Sodium 147 (H) 135 - 145 mmol/L   Potassium 4.4 3.5 - 5.1 mmol/L    Comment: DELTA CHECK NOTED   Chloride 100 (L) 101 - 111 mmol/L   CO2 38 (H) 22 - 32 mmol/L   Glucose, Bld 102 (H) 65 - 99 mg/dL   BUN 32 (H) 6 - 20 mg/dL   Creatinine, Ser 2.34 (H) 0.61 - 1.24 mg/dL   Calcium 8.8 (L) 8.9 - 10.3 mg/dL   GFR calc non Af Amer 26 (L) >60 mL/min   GFR calc Af Amer 30 (L) >60 mL/min    Comment: (NOTE) The eGFR has been calculated using the CKD EPI equation. This calculation has not been validated in all clinical situations. eGFR's persistently <60 mL/min signify possible Chronic Kidney Disease.    Anion gap 9 5 - 15    Comment: Performed at [facility], [place] [address], [place], [place] [address]  CBC     Status: Abnormal   Collection Time: 07/01/17  4:51 AM  Result Value Ref Range   WBC 8.6 4.0 - 10.5 K/uL   RBC 4.30 4.22 - 5.81 MIL/uL   Hemoglobin 10.3 (L) 13.0 - 17.0 g/dL   HCT 37.3 (L) 39.0 - 52.0 %   MCV 86.7 78.0 - 100.0 fL   MCH 24.0 (L) 26.0 - 34.0 pg   MCHC 27.6 (L) 30.0 - 36.0 g/dL   RDW 17.6 (H) 11.5 - 15.5 %   Platelets 246 150 - 400 K/uL    Comment: Performed at [facility], [place] [address], [name], [place] [address]  Hepatic function panel     Status: Abnormal   Collection Time: 07/01/17  4:51 AM  Result Value Ref Range   Total Protein 7.2 6.5 - 8.1 g/dL   Albumin 3.2 (L) 3.5 - 5.0 g/dL   AST 17 15 - 41 U/L   ALT 12 (L) 17 - 63 U/L   Alkaline Phosphatase 48 38 - 126 U/L   Total Bilirubin 0.6 0.3 - 1.2 mg/dL   Bilirubin, Direct <0.1 (L) 0.1 - 0.5 mg/dL   Indirect Bilirubin NOT CALCULATED 0.3 - 0.9  mg/dL    Comment: Performed at [facility], [place] [address], [place], [place] [address]  Troponin I     Status: Abnormal   Collection Time: 07/01/17  4:53 AM  Result Value Ref Range   Troponin I 0.10 (HH) <0.03 ng/mL    Comment: CRITICAL VALUE NOTED.  VALUE IS CONSISTENT WITH PREVIOUSLY REPORTED AND CALLED VALUE. Performed at [facility], [place] [address] [place], [place] [address]   CBG monitoring, ED     Status: Abnormal   Collection Time: 07/01/17  5:51 AM  Result Value Ref Range   Glucose-Capillary 114 (H) 65 - 99 mg/dL  Troponin I     Status: Abnormal   Collection Time: 07/01/17 10:04 AM  Result Value Ref Range   Troponin I 0.09 (HH) <0.03 ng/mL    Comment: CRITICAL VALUE NOTED.  VALUE IS CONSISTENT WITH PREVIOUSLY REPORTED AND CALLED VALUE. Performed at [facility], [place] [address], [place], [place] [address]   CBG monitoring, ED     Status: None   Collection Time: 07/01/17 10:08 AM  Result Value Ref Range   Glucose-Capillary 78 65 - 99 mg/dL  CBG monitoring, ED     Status: None   Collection Time: 07/01/17  2:47 PM  Result Value Ref Range   Glucose-Capillary 74 65 - 99 mg/dL   Dg Chest 2 View  Result Date: 06/30/2017 CLINICAL DATA:  Altered mental status.  Hypoxia. EXAM: CHEST  2 VIEW COMPARISON:  01/24/2017 FINDINGS: Low lung volumes. Cardiomegaly. Bibasilar scarring or subsegmental atelectasis. No definite consolidation or edema. No pneumothorax. Bones grossly unremarkable. IMPRESSION: Low lung volumes. No definite active infiltrates or failure. Cardiomegaly. Electronically Signed   By: Staci Righter M.D.   On: 06/30/2017 14:22   Ct Head Wo Contrast  Result Date: 06/30/2017 CLINICAL DATA:  Wife verbalizes pt "not acting like self; seeing things that aren't there; and asking if we are home when we are already there" since yesterday afternoon around 2. Pt denies weakness, numbness, or pain. Dementia. EXAM: CT HEAD WITHOUT  CONTRAST TECHNIQUE: Contiguous axial images were obtained from the base of the skull through the vertex without intravenous contrast. COMPARISON:  07/16/2009 FINDINGS: Brain: There  is moderate central and cortical atrophy. Periventricular white matter changes are consistent with small vessel disease. There is no intra or extra-axial fluid collection or mass lesion. The basilar cisterns and ventricles have a normal appearance. There is no CT evidence for acute infarction or hemorrhage. Vascular: No hyperdense vessel or unexpected calcification. Skull: Normal. Negative for fracture or focal lesion. Sinuses/Orbits: No acute finding. Other: None IMPRESSION: 1. Atrophy and small vessel disease. 2.  No evidence for acute intracranial abnormality. Electronically Signed   By: Nolon Nations M.D.   On: 06/30/2017 16:32    Pending Labs Unresulted Labs (From admission, onward)   Start     Ordered   06/30/17 1927  Carbon monoxide, blood (performed at ref lab)  Once,   R     06/30/17 1926      Vitals/Pain Today's Vitals   07/01/17 1245 07/01/17 1300 07/01/17 1412 07/01/17 1546  BP:  (!) 139/56  124/84  Pulse:  (!) 139  92  Resp:  (!) 27  (!) 22  Temp:   100 F (37.8 C) 99.2 F (37.3 C)  TempSrc:   Rectal Oral  SpO2: 95%   92%    Isolation Precautions No active isolations  Medications Medications  acetaminophen (TYLENOL) tablet 650 mg (not administered)    Or  acetaminophen (TYLENOL) suppository 650 mg (not administered)  ondansetron (ZOFRAN) tablet 4 mg (not administered)    Or  ondansetron (ZOFRAN) injection 4 mg (not administered)  hydrALAZINE (APRESOLINE) injection 10 mg (not administered)  insulin aspart (novoLOG) injection 0-15 Units (not administered)  insulin aspart (novoLOG) injection 0-5 Units (not administered)  allopurinol (ZYLOPRIM) tablet 100 mg (not administered)  aspirin EC tablet 81 mg (not administered)  docusate sodium (COLACE) capsule 50-100 mg (not administered)   finasteride (PROSCAR) tablet 5 mg (not administered)  metoprolol tartrate (LOPRESSOR) tablet 50 mg (not administered)  terazosin (HYTRIN) capsule 5 mg (not administered)  torsemide (DEMADEX) tablet 40 mg (not administered)  torsemide (DEMADEX) tablet 20 mg (not administered)  sodium chloride 0.9 % bolus 500 mL (0 mLs Intravenous Stopped 06/30/17 2030)    Mobility walks with device

## 2017-07-01 NOTE — Progress Notes (Signed)
Rt took pt off BIPAP and placed on 4 LPM [place]. No distress at this time.

## 2017-07-01 NOTE — ED Notes (Signed)
I have just given report to Abby, RN on 4 [place] [place]. Mike, RN will transport now.

## 2017-07-01 NOTE — Progress Notes (Signed)
PROGRESS NOTE    Fernando [name]  NWG:[id number] DOB: Apr 16, 1946 DOA: 06/30/2017 PCP: [facility]    Brief Narrative:  72 y.o. male with history of diabetes mellitus type 2, CHF, hypertension, chronic kidney disease, morbid obesity who was recently diagnosed with dementia and was placed on Aricept 4 months ago and was recently started on Namenda last month started experiencing increasing hallucinations yesterday afternoon.  Following which patient became progressively confused and drowsy  In the ER patient is found to be drowsy with ABG showing hypercarbic respiratory failure.  Patient was placed on BiPAP.  CT head was unremarkable.  Patient is afebrile.  UA does not show any signs of infection.  Chest x-ray was unremarkable.  Patient as per the patient's wife was at the bedside is DNR.  Patient admitted for further observation.   Assessment & Plan:   Active Problems:   HTN (hypertension)   Morbid obesity (HCC)   Acute respiratory failure with hypoxia and hypercapnia (HCC)   CKD (chronic kidney disease) stage 3, GFR 30-59 ml/min (HCC)   Dementia   Acute encephalopathy   Type 2 diabetes mellitus with renal complication (HCC)   Acute respiratory failure with hypoxia and hypercarbia (HCC)   Hypercapnic respiratory failure (HCC)   1. Acute respiratory failure with hypoxia and hypercarbia 1. Suspecting obesity hypoventilation/pickwickian syndrome 2. Clinically improved with BiPAP overnight 3. Patient currently on minimal O2 support 4. On further questioning, patient previously referred for outpatient sleep study however patient had refused 5. Suspect patient would most likely require CPAP at night.  Strongly recommend follow-through of sleep study 6. Weaned off of BiPAP, will continue on nasal cannula, continue to wean O2 as tolerated 2. Diabetes mellitus type 2 1. Patient more awake and per family, patient is near baseline mental status 2. Continue patient on diabetic  diet 3. Will continue patient on sliding scale insulin as needed 3. Dementia  1. recently started on Aricept and Namenda which can be continued when more stable 4. Hypertension 1. Blood pressure currently well controlled on as needed hydralazine only 2. We will continue to monitor for now, plan to resume home blood pressure medications as tolerates 5. Chronic kidney disease stage IV 1. creatinine appears to be at baseline 2. Repeat basic metabolic panel in the morning 6. Morbid obesity. 1. Stable at present, likely contributing to presenting hypercarbic failure 7. History of chronic CHF.  1. Continue home diuretic therapy 2. Repeat basic metabolic panel in the morning  DVT prophylaxis: SCD's Code Status: DNR Family Communication: Pt in room, family at bedside Disposition Plan: Uncertain at this time  Consultants:     Procedures:     Antimicrobials: Anti-infectives (From admission, onward)   None       Subjective: Reports feeling better today  Objective: Vitals:   07/01/17 1230 07/01/17 1300 07/01/17 1412 07/01/17 1546  BP: 113/68 (!) 139/56  124/84  Pulse: (!) 145 (!) 139  92  Resp: (!) 24 (!) 27  (!) 22  Temp:   100 F (37.8 C) 99.2 F (37.3 C)  TempSrc:   Rectal Oral  SpO2:    92%   No intake or output data in the 24 hours ending 07/01/17 1608 There were no vitals filed for this visit.  Examination:  General exam: Appears calm and comfortable  Respiratory system: Clear to auscultation. Respiratory effort normal. Cardiovascular system: S1 & S2 heard, RRR.  Gastrointestinal system: Abdomen is nondistended, soft and nontender. No organomegaly or masses felt.  Normal bowel sounds heard. Central nervous system: Alert and oriented. No focal neurological deficits. Extremities: Symmetric 5 x 5 power. Skin: No rashes, lesions Psychiatry: Judgement and insight appear normal. Mood & affect appropriate.   Data Reviewed: I have personally reviewed following labs  and imaging studies  CBC: Recent Labs  Lab 06/30/17 1441 07/01/17 0451  WBC 8.2 8.6  HGB 10.2* 10.3*  HCT 35.8* 37.3*  MCV 84.2 86.7  PLT 228 246   Basic Metabolic Panel: Recent Labs  Lab 06/30/17 1441 07/01/17 0451  NA 145 147*  K 3.6 4.4  CL 99* 100*  CO2 37* 38*  GLUCOSE 106* 102*  BUN 34* 32*  CREATININE 2.44* 2.34*  CALCIUM 8.7* 8.8*   GFR: CrCl cannot be calculated (Unknown ideal weight.). Liver Function Tests: Recent Labs  Lab 06/30/17 1441 07/01/17 0451  AST 14* 17  ALT 9* 12*  ALKPHOS 49 48  BILITOT 0.3 0.6  PROT 7.0 7.2  ALBUMIN 3.2* 3.2*   No results for input(s): LIPASE, AMYLASE in the last 168 hours. Recent Labs  Lab 07/01/17 0012  AMMONIA 29   Coagulation Profile: No results for input(s): INR, PROTIME in the last 168 hours. Cardiac Enzymes: Recent Labs  Lab 07/01/17 0012 07/01/17 0453 07/01/17 1004  TROPONINI 0.10* 0.10* 0.09*   BNP (last 3 results) No results for input(s): PROBNP in the last 8760 hours. HbA1C: No results for input(s): HGBA1C in the last 72 hours. CBG: Recent Labs  Lab 06/30/17 2215 07/01/17 0128 07/01/17 0551 07/01/17 1008 07/01/17 1447  GLUCAP 105* 120* 114* 78 74   Lipid Profile: No results for input(s): CHOL, HDL, LDLCALC, TRIG, CHOLHDL, LDLDIRECT in the last 72 hours. Thyroid Function Tests: Recent Labs    07/01/17 0012  TSH 2.554   Anemia Panel: No results for input(s): VITAMINB12, FOLATE, FERRITIN, TIBC, IRON, RETICCTPCT in the last 72 hours. Sepsis Labs: No results for input(s): PROCALCITON, LATICACIDVEN in the last 168 hours.  No results found for this or any previous visit (from the past 240 hour(s)).   Radiology Studies: Dg Chest 2 View  Result Date: 06/30/2017 CLINICAL DATA:  Altered mental status.  Hypoxia. EXAM: CHEST  2 VIEW COMPARISON:  01/24/2017 FINDINGS: Low lung volumes. Cardiomegaly. Bibasilar scarring or subsegmental atelectasis. No definite consolidation or edema. No  pneumothorax. Bones grossly unremarkable. IMPRESSION: Low lung volumes. No definite active infiltrates or failure. Cardiomegaly. Electronically Signed   By: Elsie StainJohn T Curnes M.D.   On: 06/30/2017 14:22   Ct Head Wo Contrast  Result Date: 06/30/2017 CLINICAL DATA:  Wife verbalizes pt "not acting like self; seeing things that aren't there; and asking if we are home when we are already there" since yesterday afternoon around 2. Pt denies weakness, numbness, or pain. Dementia. EXAM: CT HEAD WITHOUT CONTRAST TECHNIQUE: Contiguous axial images were obtained from the base of the skull through the vertex without intravenous contrast. COMPARISON:  07/16/2009 FINDINGS: Brain: There is moderate central and cortical atrophy. Periventricular white matter changes are consistent with small vessel disease. There is no intra or extra-axial fluid collection or mass lesion. The basilar cisterns and ventricles have a normal appearance. There is no CT evidence for acute infarction or hemorrhage. Vascular: No hyperdense vessel or unexpected calcification. Skull: Normal. Negative for fracture or focal lesion. Sinuses/Orbits: No acute finding. Other: None IMPRESSION: 1. Atrophy and small vessel disease. 2.  No evidence for acute intracranial abnormality. Electronically Signed   By: Norva PavlovElizabeth  Brown M.D.   On: 06/30/2017 16:32  Scheduled Meds: . insulin aspart  0-15 Units Subcutaneous TID WC  . insulin aspart  0-5 Units Subcutaneous QHS   Continuous Infusions:   LOS: 1 day   Rickey Barbara, MD [facility] Pager [phone]  If 7PM-7AM, please contact night-coverage [email or web address] Password [facility] 07/01/2017, 4:08 PM

## 2017-07-01 NOTE — Progress Notes (Addendum)
Pt remains on  BIPAP at this time. No distress noted. 

## 2017-07-01 NOTE — ED Notes (Signed)
He continues to do well off the bi-pap. He was removed from same earlier per R.T. Protocol.

## 2017-07-02 ENCOUNTER — Inpatient Hospital Stay (HOSPITAL_COMMUNITY): Payer: Medicare Other

## 2017-07-02 LAB — CBC
HEMATOCRIT: 33.9 % — AB (ref 39.0–52.0)
Hemoglobin: 9.2 g/dL — ABNORMAL LOW (ref 13.0–17.0)
MCH: 23.7 pg — ABNORMAL LOW (ref 26.0–34.0)
MCHC: 27.1 g/dL — AB (ref 30.0–36.0)
MCV: 87.1 fL (ref 78.0–100.0)
PLATELETS: 217 10*3/uL (ref 150–400)
RBC: 3.89 MIL/uL — ABNORMAL LOW (ref 4.22–5.81)
RDW: 17.4 % — ABNORMAL HIGH (ref 11.5–15.5)
WBC: 7.2 10*3/uL (ref 4.0–10.5)

## 2017-07-02 LAB — COMPREHENSIVE METABOLIC PANEL
ALBUMIN: 2.6 g/dL — AB (ref 3.5–5.0)
ALT: 8 U/L — AB (ref 17–63)
AST: 11 U/L — ABNORMAL LOW (ref 15–41)
Alkaline Phosphatase: 39 U/L (ref 38–126)
Anion gap: 7 (ref 5–15)
BILIRUBIN TOTAL: 0.5 mg/dL (ref 0.3–1.2)
BUN: 32 mg/dL — ABNORMAL HIGH (ref 6–20)
CO2: 36 mmol/L — ABNORMAL HIGH (ref 22–32)
CREATININE: 2.41 mg/dL — AB (ref 0.61–1.24)
Calcium: 8 mg/dL — ABNORMAL LOW (ref 8.9–10.3)
Chloride: 99 mmol/L — ABNORMAL LOW (ref 101–111)
GFR calc Af Amer: 29 mL/min — ABNORMAL LOW (ref >60)
GFR, EST NON AFRICAN AMERICAN: 25 mL/min — AB (ref >60)
GLUCOSE: 104 mg/dL — AB (ref 65–99)
POTASSIUM: 3.7 mmol/L (ref 3.5–5.1)
Sodium: 142 mmol/L (ref 135–145)
TOTAL PROTEIN: 5.8 g/dL — AB (ref 6.5–8.1)

## 2017-07-02 LAB — MRSA PCR SCREENING: MRSA by PCR: NEGATIVE

## 2017-07-02 LAB — BLOOD GAS, ARTERIAL
ACID-BASE EXCESS: 8.7 mmol/L — AB (ref 0.0–2.0)
BICARBONATE: 38.8 mmol/L — AB (ref 20.0–28.0)
Drawn by: 441261
O2 CONTENT: 6 L/min
O2 SAT: 94.6 %
PCO2 ART: 95.5 mmHg — AB (ref 32.0–48.0)
PO2 ART: 83.4 mmHg (ref 83.0–108.0)
Patient temperature: 97.5
pH, Arterial: 7.228 — ABNORMAL LOW (ref 7.350–7.450)

## 2017-07-02 LAB — GLUCOSE, CAPILLARY
GLUCOSE-CAPILLARY: 134 mg/dL — AB (ref 65–99)
Glucose-Capillary: 104 mg/dL — ABNORMAL HIGH (ref 65–99)
Glucose-Capillary: 101 mg/dL — ABNORMAL HIGH (ref 65–99)

## 2017-07-02 LAB — INFLUENZA PANEL BY PCR (TYPE A & B)
INFLBPCR: NEGATIVE
Influenza A By PCR: NEGATIVE

## 2017-07-02 MED ORDER — LABETALOL HCL 5 MG/ML IV SOLN
5.0000 mg | INTRAVENOUS | Status: DC | PRN
  Filled 2017-07-02: qty 4

## 2017-07-02 MED ORDER — METOPROLOL TARTRATE 5 MG/5ML IV SOLN
5.0000 mg | Freq: Four times a day (QID) | INTRAVENOUS | Status: DC
  Administered 2017-07-02 – 2017-07-03 (×3): 5 mg via INTRAVENOUS
  Filled 2017-07-02 (×3): qty 5

## 2017-07-02 MED ORDER — DEXTROSE 5 % IV SOLN
500.0000 mg | INTRAVENOUS | Status: AC
  Administered 2017-07-02 – 2017-07-08 (×7): 500 mg via INTRAVENOUS
  Filled 2017-07-02 (×8): qty 500

## 2017-07-02 MED ORDER — DEXTROSE 5 % IV SOLN
1.0000 g | INTRAVENOUS | Status: AC
  Administered 2017-07-02 – 2017-07-08 (×7): 1 g via INTRAVENOUS
  Filled 2017-07-02 (×7): qty 10

## 2017-07-02 NOTE — Progress Notes (Addendum)
PROGRESS NOTE    Fernando [name] Fernando [name]  UJW:[id number] DOB: 1945-12-26 DOA: 06/30/2017 PCP: [facility]    Brief Narrative:  72 y.o. male with history of diabetes mellitus type 2, CHF, hypertension, chronic kidney disease, morbid obesity who was recently diagnosed with dementia and was placed on Aricept 4 months ago and was recently started on Namenda last month started experiencing increasing hallucinations yesterday afternoon.  Following which patient became progressively confused and drowsy  In the ER patient is found to be drowsy with ABG showing hypercarbic respiratory failure.  Patient was placed on BiPAP.  CT head was unremarkable.  Patient is afebrile.  UA does not show any signs of infection.  Chest x-ray was unremarkable.  Patient as per the patient's wife was at the bedside is DNR.  Patient admitted for further observation.   Assessment & Plan:   Active Problems:   HTN (hypertension)   Morbid obesity (HCC)   Acute respiratory failure with hypoxia and hypercapnia (HCC)   CKD (chronic kidney disease) stage 3, GFR 30-59 ml/min (HCC)   Dementia   Acute encephalopathy   Type 2 diabetes mellitus with renal complication (HCC)   Acute respiratory failure with hypoxia and hypercarbia (HCC)   Hypercapnic respiratory failure (HCC)   1. Acute respiratory failure with hypoxia and hypercarbia 1. Suspecting obesity hypoventilation/pickwickian syndrome 2. Clinically improved with bipap at night 3. Patient currently on 4L [place] 4. On further questioning, patient previously referred for outpatient sleep study however patient had refused 5. Suspect patient would most likely require CPAP at night.  Strongly recommend follow-through of sleep study 6. Weaned off of BiPAP, will continue on nasal cannula, continue to wean O2 as tolerated 7. CXR ordered and reviewd this AM, findings suggestive of PNA. Start azithro and rocephin 8. Flu swab neg 9. Resp viral panel pending 2. Diabetes  mellitus type 2 1. Patient more awake and per family, patient is near baseline mental status 2. Continue patient on diabetic diet as tolerated 3. Will continue patient on sliding scale insulin as needed 3. Dementia  1. recently started on Aricept and Namenda which can be continued when more stable 4. Hypertension 1. Blood pressures stable 2. Have continued home BP meds 5. Chronic kidney disease stage IV 1. creatinine appears to be at baseline 2. Recheck basic metabolic panel in the morning 6. Morbid obesity. 1. Stable at present, likely contributing to presenting hypercarbic failure 7. History of chronic CHF.  1. Continue home diuretic therapy 2. Recheck basic metabolic panel in the morning 8. CAP with sepsis not present on admission 1. Fevers with tachycardia noted overnight 2. CXR with new findings of PNA 3. Now on rocephin and azithro 4. Flu swab neg 5. Resp viral panel pending 9. Metabolic encephalopathy secondary to hypercarbia 1. Worsened with higher pCO2 levels 2. Continue to bipap as tolerated  DVT prophylaxis: SCD's Code Status: DNR Family Communication: Pt in room, family at bedside Disposition Plan: Uncertain at this time  Consultants:     Procedures:     Antimicrobials: Anti-infectives (From admission, onward)   Start     Dose/Rate Route Frequency Ordered Stop   07/02/17 1030  azithromycin (ZITHROMAX) 500 mg in dextrose 5 % 250 mL IVPB     500 mg 250 mL/hr over 60 Minutes Intravenous Every 24 hours 07/02/17 0937 07/09/17 1029   07/02/17 1000  cefTRIAXone (ROCEPHIN) 1 g in dextrose 5 % 50 mL IVPB     1 g 100 mL/hr over 30 Minutes  Intravenous Every 24 hours 07/02/17 0937 07/09/17 0959      Subjective: States feeling better today  Objective: Vitals:   07/01/17 2300 07/02/17 0500 07/02/17 0515 07/02/17 1323  BP:  113/64  136/76  Pulse: 82 71  95  Resp: 20 18  (!) 22  Temp:  99.8 F (37.7 C)  100.2 F (37.9 C)  TempSrc:  Axillary  Oral  SpO2:  90% (!) 85% 93% 91%  Weight:  (!) 137.4 kg (302 lb 14.4 oz)    Height:        Intake/Output Summary (Last 24 hours) at 07/02/2017 1406 Last data filed at 07/02/2017 0805 Gross per 24 hour  Intake 1077 ml  Output 1800 ml  Net -723 ml   Filed Weights   07/01/17 1830 07/02/17 0500  Weight: (!) 137.9 kg (304 lb) (!) 137.4 kg (302 lb 14.4 oz)    Examination: General exam: Awake, laying in bed, in nad Respiratory system: decreased BS, no wheezing Cardiovascular system: regular rate, s1, s2 Gastrointestinal system: Soft, nondistended, positive BS Central nervous system: CN2-12 grossly intact, strength intact Extremities: Perfused, no clubbing Skin: Normal skin turgor, no notable skin lesions seen Psychiatry: Mood normal // no visual hallucinations   Data Reviewed: I have personally reviewed following labs and imaging studies  CBC: Recent Labs  Lab 06/30/17 1441 07/01/17 0451 07/02/17 0638  WBC 8.2 8.6 7.2  HGB 10.2* 10.3* 9.2*  HCT 35.8* 37.3* 33.9*  MCV 84.2 86.7 87.1  PLT 228 246 217   Basic Metabolic Panel: Recent Labs  Lab 06/30/17 1441 07/01/17 0451 07/02/17 0638  NA 145 147* 142  K 3.6 4.4 3.7  CL 99* 100* 99*  CO2 37* 38* 36*  GLUCOSE 106* 102* 104*  BUN 34* 32* 32*  CREATININE 2.44* 2.34* 2.41*  CALCIUM 8.7* 8.8* 8.0*   GFR: Estimated Creatinine Clearance: 39.8 mL/min (Fernando) (by C-G formula based on SCr of 2.41 mg/dL (H)). Liver Function Tests: Recent Labs  Lab 06/30/17 1441 07/01/17 0451 07/02/17 0638  AST 14* 17 11*  ALT 9* 12* 8*  ALKPHOS 49 48 39  BILITOT 0.3 0.6 0.5  PROT 7.0 7.2 5.8*  ALBUMIN 3.2* 3.2* 2.6*   No results for input(s): LIPASE, AMYLASE in the last 168 hours. Recent Labs  Lab 07/01/17 0012  AMMONIA 29   Coagulation Profile: No results for input(s): INR, PROTIME in the last 168 hours. Cardiac Enzymes: Recent Labs  Lab 07/01/17 0012 07/01/17 0453 07/01/17 1004  TROPONINI 0.10* 0.10* 0.09*   BNP (last 3 results) No  results for input(s): PROBNP in the last 8760 hours. HbA1C: No results for input(s): HGBA1C in the last 72 hours. CBG: Recent Labs  Lab 07/01/17 1447 07/01/17 1756 07/01/17 2228 07/02/17 0748 07/02/17 1221  GLUCAP 74 80 123* 104* 134*   Lipid Profile: No results for input(s): CHOL, HDL, LDLCALC, TRIG, CHOLHDL, LDLDIRECT in the last 72 hours. Thyroid Function Tests: Recent Labs    07/01/17 0012  TSH 2.554   Anemia Panel: No results for input(s): VITAMINB12, FOLATE, FERRITIN, TIBC, IRON, RETICCTPCT in the last 72 hours. Sepsis Labs: No results for input(s): PROCALCITON, LATICACIDVEN in the last 168 hours.  No results found for this or any previous visit (from the past 240 hour(s)).   Radiology Studies: Dg Chest 2 View  Result Date: 06/30/2017 CLINICAL DATA:  Altered mental status.  Hypoxia. EXAM: CHEST  2 VIEW COMPARISON:  01/24/2017 FINDINGS: Low lung volumes. Cardiomegaly. Bibasilar scarring or subsegmental atelectasis. No definite consolidation  or edema. No pneumothorax. Bones grossly unremarkable. IMPRESSION: Low lung volumes. No definite active infiltrates or failure. Cardiomegaly. Electronically Signed   By: Elsie Stain M.D.   On: 06/30/2017 14:22   Ct Head Wo Contrast  Result Date: 06/30/2017 CLINICAL DATA:  Wife verbalizes pt "not acting like self; seeing things that aren't there; and asking if we are home when we are already there" since yesterday afternoon around 2. Pt denies weakness, numbness, or pain. Dementia. EXAM: CT HEAD WITHOUT CONTRAST TECHNIQUE: Contiguous axial images were obtained from the base of the skull through the vertex without intravenous contrast. COMPARISON:  07/16/2009 FINDINGS: Brain: There is moderate central and cortical atrophy. Periventricular white matter changes are consistent with small vessel disease. There is no intra or extra-axial fluid collection or mass lesion. The basilar cisterns and ventricles have Fernando normal appearance. There is no CT  evidence for acute infarction or hemorrhage. Vascular: No hyperdense vessel or unexpected calcification. Skull: Normal. Negative for fracture or focal lesion. Sinuses/Orbits: No acute finding. Other: None IMPRESSION: 1. Atrophy and small vessel disease. 2.  No evidence for acute intracranial abnormality. Electronically Signed   By: Norva Pavlov M.D.   On: 06/30/2017 16:32   Dg Chest Port 1 View  Result Date: 07/02/2017 CLINICAL DATA:  Fever EXAM: PORTABLE CHEST 1 VIEW COMPARISON:  06/30/2017; 01/24/2017 FINDINGS: Grossly unchanged enlarged cardiac silhouette and mediastinal contours given persistently reduced lung volumes. Worsening bibasilar heterogeneous/consolidative opacities, left greater than right. Trace left-sided effusion is not excluded. Pulmonary vasculature appears less distinct than present examination with cephalization of flow. No definite pneumothorax. No acute osseus abnormalities. IMPRESSION: 1. Worsening bibasilar heterogeneous/consolidative opacities, left greater than right, atelectasis versus infiltrate. Further evaluation with a PA and lateral chest radiograph may be obtained as clinically indicated. 2. Suspected mild pulmonary venous congestion with potential development of Fernando trace left-sided effusion. Electronically Signed   By: Simonne Come M.D.   On: 07/02/2017 09:07    Scheduled Meds: . allopurinol  100 mg Oral Q breakfast  . aspirin EC  81 mg Oral QODAY  . docusate sodium  100 mg Oral Q breakfast  . docusate sodium  50 mg Oral QHS  . finasteride  5 mg Oral Q breakfast  . Influenza vac split quadrivalent PF  0.5 mL Intramuscular Tomorrow-1000  . insulin aspart  0-15 Units Subcutaneous TID WC  . insulin aspart  0-5 Units Subcutaneous QHS  . metoprolol tartrate  50 mg Oral BID  . terazosin  5 mg Oral Q supper  . torsemide  20 mg Oral QPM  . torsemide  40 mg Oral Daily   Continuous Infusions: . azithromycin 500 mg (07/02/17 1239)  . cefTRIAXone (ROCEPHIN)  IV Stopped  (07/02/17 1225)     LOS: 2 days   Rickey Barbara, MD [facility] Pager [phone]  If 7PM-7AM, please contact night-coverage [email or web address] Password [email or web address] 07/02/2017, 2:06 PM

## 2017-07-02 NOTE — Progress Notes (Signed)
PT Cancellation Note  Patient Details Name: Fernando GeninRichard A Puebla MRN: [id number] DOB: Aug 02, 1945   Cancelled Treatment:     Noted pt change in medical status today. Will hold PT assessment today and check on pt tomorrow.    Marella BileBRITT, Terrion Gencarelli 07/02/2017, 4:43 PM Marella BileSharron Kiasia Chou, PT Pager: [phone] 07/02/2017

## 2017-07-02 NOTE — Progress Notes (Signed)
Rt placed pt on BIPAP 20/6 30% in ICU.

## 2017-07-02 NOTE — Progress Notes (Addendum)
Rt called to do ABG. Pt CO2 is 95. Rt placed pt on his bedside BIPAP machine why he is waiting on ICU bed. RN and MD aware.

## 2017-07-02 NOTE — Progress Notes (Signed)
Pt transferred to stepdown. Report called to RN. Pt transferred to 1236 and family was notified of transfer.

## 2017-07-03 LAB — RESPIRATORY PANEL BY PCR
Adenovirus: NOT DETECTED
BORDETELLA PERTUSSIS-RVPCR: NOT DETECTED
CHLAMYDOPHILA PNEUMONIAE-RVPPCR: NOT DETECTED
CORONAVIRUS NL63-RVPPCR: NOT DETECTED
Coronavirus 229E: NOT DETECTED
Coronavirus HKU1: NOT DETECTED
Coronavirus OC43: NOT DETECTED
INFLUENZA A-RVPPCR: NOT DETECTED
Influenza B: NOT DETECTED
METAPNEUMOVIRUS-RVPPCR: NOT DETECTED
Mycoplasma pneumoniae: NOT DETECTED
PARAINFLUENZA VIRUS 3-RVPPCR: NOT DETECTED
PARAINFLUENZA VIRUS 4-RVPPCR: NOT DETECTED
Parainfluenza Virus 1: NOT DETECTED
Parainfluenza Virus 2: NOT DETECTED
RHINOVIRUS / ENTEROVIRUS - RVPPCR: NOT DETECTED
Respiratory Syncytial Virus: NOT DETECTED

## 2017-07-03 LAB — BLOOD CULTURE ID PANEL (REFLEXED)
ACINETOBACTER BAUMANNII: NOT DETECTED
CANDIDA ALBICANS: NOT DETECTED
CANDIDA GLABRATA: NOT DETECTED
CANDIDA KRUSEI: NOT DETECTED
Candida parapsilosis: NOT DETECTED
Candida tropicalis: NOT DETECTED
ENTEROBACTER CLOACAE COMPLEX: NOT DETECTED
ENTEROBACTERIACEAE SPECIES: NOT DETECTED
ENTEROCOCCUS SPECIES: NOT DETECTED
ESCHERICHIA COLI: NOT DETECTED
Haemophilus influenzae: NOT DETECTED
Klebsiella oxytoca: NOT DETECTED
Klebsiella pneumoniae: NOT DETECTED
LISTERIA MONOCYTOGENES: NOT DETECTED
Methicillin resistance: NOT DETECTED
NEISSERIA MENINGITIDIS: NOT DETECTED
PROTEUS SPECIES: NOT DETECTED
PSEUDOMONAS AERUGINOSA: NOT DETECTED
STREPTOCOCCUS SPECIES: NOT DETECTED
Serratia marcescens: NOT DETECTED
Staphylococcus aureus (BCID): NOT DETECTED
Staphylococcus species: DETECTED — AB
Streptococcus agalactiae: NOT DETECTED
Streptococcus pneumoniae: NOT DETECTED
Streptococcus pyogenes: NOT DETECTED

## 2017-07-03 LAB — GLUCOSE, CAPILLARY
GLUCOSE-CAPILLARY: 110 mg/dL — AB (ref 65–99)
Glucose-Capillary: 118 mg/dL — ABNORMAL HIGH (ref 65–99)
Glucose-Capillary: 146 mg/dL — ABNORMAL HIGH (ref 65–99)
Glucose-Capillary: 134 mg/dL — ABNORMAL HIGH (ref 65–99)

## 2017-07-03 LAB — BASIC METABOLIC PANEL
Anion gap: 12 (ref 5–15)
BUN: 35 mg/dL — AB (ref 6–20)
CALCIUM: 8.4 mg/dL — AB (ref 8.9–10.3)
CO2: 31 mmol/L (ref 22–32)
Chloride: 102 mmol/L (ref 101–111)
Creatinine, Ser: 2.2 mg/dL — ABNORMAL HIGH (ref 0.61–1.24)
GFR calc non Af Amer: 28 mL/min — ABNORMAL LOW (ref >60)
GFR, EST AFRICAN AMERICAN: 33 mL/min — AB (ref >60)
Glucose, Bld: 103 mg/dL — ABNORMAL HIGH (ref 65–99)
Potassium: 4.1 mmol/L (ref 3.5–5.1)
SODIUM: 145 mmol/L (ref 135–145)

## 2017-07-03 LAB — BLOOD GAS, ARTERIAL
Acid-Base Excess: 10.5 mmol/L — ABNORMAL HIGH (ref 0.0–2.0)
BICARBONATE: 38.9 mmol/L — AB (ref 20.0–28.0)
DRAWN BY: 270211
O2 Content: 4 L/min
O2 Saturation: 93.2 %
PATIENT TEMPERATURE: 98.6
PH ART: 7.297 — AB (ref 7.350–7.450)
pCO2 arterial: 82.2 mmHg (ref 32.0–48.0)
pO2, Arterial: 75 mmHg — ABNORMAL LOW (ref 83.0–108.0)

## 2017-07-03 LAB — CARBON MONOXIDE, BLOOD (PERFORMED AT REF LAB): Carbon Monoxide, Blood: 4.9 % — ABNORMAL HIGH (ref 0.0–3.6)

## 2017-07-03 MED ORDER — METOPROLOL TARTRATE 5 MG/5ML IV SOLN
5.0000 mg | Freq: Once | INTRAVENOUS | Status: AC
  Administered 2017-07-03: 5 mg via INTRAVENOUS
  Filled 2017-07-03: qty 5

## 2017-07-03 MED ORDER — DOCUSATE SODIUM 50 MG PO CAPS
50.0000 mg | ORAL_CAPSULE | Freq: Every day | ORAL | Status: DC
  Administered 2017-07-03 – 2017-07-10 (×6): 50 mg via ORAL
  Filled 2017-07-03 (×9): qty 1

## 2017-07-03 MED ORDER — ASPIRIN EC 81 MG PO TBEC
81.0000 mg | DELAYED_RELEASE_TABLET | ORAL | Status: DC
  Administered 2017-07-03 – 2017-07-11 (×5): 81 mg via ORAL
  Filled 2017-07-03 (×9): qty 1

## 2017-07-03 MED ORDER — TERAZOSIN HCL 5 MG PO CAPS
5.0000 mg | ORAL_CAPSULE | Freq: Every day | ORAL | Status: DC
  Administered 2017-07-03 – 2017-07-10 (×8): 5 mg via ORAL
  Filled 2017-07-03 (×11): qty 1

## 2017-07-03 MED ORDER — METOPROLOL TARTRATE 25 MG PO TABS
50.0000 mg | ORAL_TABLET | Freq: Two times a day (BID) | ORAL | Status: DC
  Administered 2017-07-03 – 2017-07-04 (×2): 50 mg via ORAL
  Filled 2017-07-03 (×2): qty 2

## 2017-07-03 MED ORDER — DOCUSATE SODIUM 100 MG PO CAPS
100.0000 mg | ORAL_CAPSULE | Freq: Every day | ORAL | Status: DC
  Administered 2017-07-04 – 2017-07-11 (×8): 100 mg via ORAL
  Filled 2017-07-03 (×8): qty 1

## 2017-07-03 MED ORDER — FINASTERIDE 5 MG PO TABS
5.0000 mg | ORAL_TABLET | Freq: Every day | ORAL | Status: DC
  Administered 2017-07-05 – 2017-07-11 (×7): 5 mg via ORAL
  Filled 2017-07-03 (×8): qty 1

## 2017-07-03 MED ORDER — LORAZEPAM 2 MG/ML IJ SOLN
1.0000 mg | Freq: Once | INTRAMUSCULAR | Status: AC
  Administered 2017-07-03: 1 mg via INTRAVENOUS
  Filled 2017-07-03: qty 1

## 2017-07-03 MED ORDER — ALLOPURINOL 100 MG PO TABS
100.0000 mg | ORAL_TABLET | Freq: Every day | ORAL | Status: DC
  Administered 2017-07-04 – 2017-07-11 (×8): 100 mg via ORAL
  Filled 2017-07-03 (×8): qty 1

## 2017-07-03 MED ORDER — TORSEMIDE 20 MG PO TABS
40.0000 mg | ORAL_TABLET | Freq: Every day | ORAL | Status: DC
  Administered 2017-07-03 – 2017-07-11 (×9): 40 mg via ORAL
  Filled 2017-07-03 (×10): qty 2

## 2017-07-03 NOTE — Progress Notes (Signed)
Hospitalist APP notified of sustained SVT. Patient instructed to vagal in the mean time. BiPap mas applied. Lopressor ordered and give.

## 2017-07-03 NOTE — Progress Notes (Signed)
PROGRESS NOTE    Fernando [name]  ZOX:[id number] DOB: 1945-11-01 DOA: 06/30/2017 PCP: [facility]    Brief Narrative:  72 y.o. male with history of diabetes mellitus type 2, CHF, hypertension, chronic kidney disease, morbid obesity who was recently diagnosed with dementia and was placed on Aricept 4 months ago and was recently started on Namenda last month started experiencing increasing hallucinations yesterday afternoon.  Following which patient became progressively confused and drowsy  In the ER patient is found to be drowsy with ABG showing hypercarbic respiratory failure.  Patient was placed on BiPAP.  CT head was unremarkable.  Patient is afebrile.  UA does not show any signs of infection.  Chest x-ray was unremarkable.  Patient as per the patient's wife was at the bedside is DNR.  Patient admitted for further observation.   Assessment & Plan:   Active Problems:   HTN (hypertension)   Morbid obesity (HCC)   Acute respiratory failure with hypoxia and hypercapnia (HCC)   CKD (chronic kidney disease) stage 3, GFR 30-59 ml/min (HCC)   Dementia   Acute encephalopathy   Type 2 diabetes mellitus with renal complication (HCC)   Acute respiratory failure with hypoxia and hypercarbia (HCC)   Hypercapnic respiratory failure (HCC)   1. Acute respiratory failure with hypoxia and hypercarbia 1. Suspecting obesity hypoventilation/pickwickian syndrome 2. Clinically improved with bipap 3. Repeat ABG ordered, pCO2 remains over 80 with resp acidosis 4. Continue BiPAP. Follow ABG 5. Suspect patient would most likely require CPAP at night.  Strongly recommend follow-through of sleep study 6. CXR ordered and reviewd this AM, findings suggestive of PNA. Continued azithro and rocephin 7. Flu swab neg 8. Resp viral panel neg 2. Diabetes mellitus type 2 1. Patient more awake and per family, patient is near baseline mental status 2. Continue patient on diabetic diet as  tolerated 3. Will continue patient on sliding scale insulin as needed 4. Stable at present 3. Dementia  1. recently started on Aricept and Namenda which can be continued when more stable 2. Per chart review,  4. Hypertension 1. Have continued home BP meds 2. Stable at present 5. Chronic kidney disease stage IV 1. creatinine appears to be at baseline 2. Repeat basic metabolic panel in the morning 6. Morbid obesity. 1. Stable at present, likely contributing to presenting hypercarbic failure 7. History of chronic CHF.  1. Continue home diuretic therapy 2. Repeat basic metabolic panel in the morning 8. CAP with sepsis not present on admission 1. Fevers with tachycardia noted overnight 2. CXR with new findings of PNA 3. Now on rocephin and azithro 4. Flu swab neg 5. Resp viral panel neg 9. Metabolic encephalopathy secondary to hypercarbia 1. Worsened with higher pCO2 levels 2. Continue to bipap as tolerated 3. Follow serial ABG  DVT prophylaxis: SCD's Code Status: DNR Family Communication: Pt in room, family at bedside Disposition Plan: Uncertain at this time  Consultants:     Procedures:     Antimicrobials: Anti-infectives (From admission, onward)   Start     Dose/Rate Route Frequency Ordered Stop   07/02/17 1030  azithromycin (ZITHROMAX) 500 mg in dextrose 5 % 250 mL IVPB     500 mg 250 mL/hr over 60 Minutes Intravenous Every 24 hours 07/02/17 0937 07/09/17 1029   07/02/17 1000  cefTRIAXone (ROCEPHIN) 1 g in dextrose 5 % 50 mL IVPB     1 g 100 mL/hr over 30 Minutes Intravenous Every 24 hours 07/02/17 0937 07/09/17 0959  Subjective: Reports feeling better  Objective: Vitals:   07/03/17 0900 07/03/17 1034 07/03/17 1200 07/03/17 1224  BP: (!) 168/71   (!) 105/41  Pulse: 87 88  84  Resp: (!) 26 18  (!) 21  Temp:   99.2 F (37.3 C)   TempSrc:   Axillary   SpO2: 95% 91%  90%  Weight:      Height:        Intake/Output Summary (Last 24 hours) at 07/03/2017  1452 Last data filed at 07/03/2017 1200 Gross per 24 hour  Intake 300 ml  Output 1450 ml  Net -1150 ml   Filed Weights   07/01/17 1830 07/02/17 0500 07/03/17 0533  Weight: (!) 137.9 kg (304 lb) (!) 137.4 kg (302 lb 14.4 oz) (!) 139.3 kg (307 lb 1.6 oz)    Examination: General exam: Conversant, in no acute distress Respiratory system: normal chest rise, clear, no audible wheezing Cardiovascular system: regular rhythm, s1-s2 Gastrointestinal system: Nondistended, nontender, pos BS Central nervous system: No seizures, no tremors Extremities: No cyanosis, no joint deformities Skin: No rashes, no pallor Psychiatry: Affect normal // no auditory hallucinations   Data Reviewed: I have personally reviewed following labs and imaging studies  CBC: Recent Labs  Lab 06/30/17 1441 07/01/17 0451 07/02/17 0638  WBC 8.2 8.6 7.2  HGB 10.2* 10.3* 9.2*  HCT 35.8* 37.3* 33.9*  MCV 84.2 86.7 87.1  PLT 228 246 217   Basic Metabolic Panel: Recent Labs  Lab 06/30/17 1441 07/01/17 0451 07/02/17 0638 07/03/17 0500  NA 145 147* 142 145  K 3.6 4.4 3.7 4.1  CL 99* 100* 99* 102  CO2 37* 38* 36* 31  GLUCOSE 106* 102* 104* 103*  BUN 34* 32* 32* 35*  CREATININE 2.44* 2.34* 2.41* 2.20*  CALCIUM 8.7* 8.8* 8.0* 8.4*   GFR: Estimated Creatinine Clearance: 43.9 mL/min (A) (by C-G formula based on SCr of 2.2 mg/dL (H)). Liver Function Tests: Recent Labs  Lab 06/30/17 1441 07/01/17 0451 07/02/17 0638  AST 14* 17 11*  ALT 9* 12* 8*  ALKPHOS 49 48 39  BILITOT 0.3 0.6 0.5  PROT 7.0 7.2 5.8*  ALBUMIN 3.2* 3.2* 2.6*   No results for input(s): LIPASE, AMYLASE in the last 168 hours. Recent Labs  Lab 07/01/17 0012  AMMONIA 29   Coagulation Profile: No results for input(s): INR, PROTIME in the last 168 hours. Cardiac Enzymes: Recent Labs  Lab 07/01/17 0012 07/01/17 0453 07/01/17 1004  TROPONINI 0.10* 0.10* 0.09*   BNP (last 3 results) No results for input(s): PROBNP in the last 8760  hours. HbA1C: No results for input(s): HGBA1C in the last 72 hours. CBG: Recent Labs  Lab 07/02/17 0748 07/02/17 1221 07/02/17 2107 07/03/17 0737 07/03/17 1112  GLUCAP 104* 134* 101* 118* 146*   Lipid Profile: No results for input(s): CHOL, HDL, LDLCALC, TRIG, CHOLHDL, LDLDIRECT in the last 72 hours. Thyroid Function Tests: Recent Labs    07/01/17 0012  TSH 2.554   Anemia Panel: No results for input(s): VITAMINB12, FOLATE, FERRITIN, TIBC, IRON, RETICCTPCT in the last 72 hours. Sepsis Labs: No results for input(s): PROCALCITON, LATICACIDVEN in the last 168 hours.  Recent Results (from the past 240 hour(s))  Respiratory Panel by PCR     Status: None   Collection Time: 07/02/17 10:01 AM  Result Value Ref Range Status   Adenovirus NOT DETECTED NOT DETECTED Final   Coronavirus 229E NOT DETECTED NOT DETECTED Final   Coronavirus HKU1 NOT DETECTED NOT DETECTED Final  Coronavirus NL63 NOT DETECTED NOT DETECTED Final   Coronavirus OC43 NOT DETECTED NOT DETECTED Final   Metapneumovirus NOT DETECTED NOT DETECTED Final   Rhinovirus / Enterovirus NOT DETECTED NOT DETECTED Final   Influenza A NOT DETECTED NOT DETECTED Final   Influenza B NOT DETECTED NOT DETECTED Final   Parainfluenza Virus 1 NOT DETECTED NOT DETECTED Final   Parainfluenza Virus 2 NOT DETECTED NOT DETECTED Final   Parainfluenza Virus 3 NOT DETECTED NOT DETECTED Final   Parainfluenza Virus 4 NOT DETECTED NOT DETECTED Final   Respiratory Syncytial Virus NOT DETECTED NOT DETECTED Final   Bordetella pertussis NOT DETECTED NOT DETECTED Final   Chlamydophila pneumoniae NOT DETECTED NOT DETECTED Final   Mycoplasma pneumoniae NOT DETECTED NOT DETECTED Final    Comment: Performed at [facility], [address] [place], [place] [address]  Culture, blood (routine x 2) Call MD if unable to obtain prior to antibiotics being given     Status: None (Preliminary result)   Collection Time: 07/02/17 10:25 AM  Result Value  Ref Range Status   Specimen Description BLOOD RIGHT ARM  Final   Special Requests   Final    IN PEDIATRIC BOTTLE Blood Culture adequate volume Performed at [facility], [address] [place], [place] [address]    Culture PENDING  Incomplete   Report Status PENDING  Incomplete  Culture, blood (routine x 2) Call MD if unable to obtain prior to antibiotics being given     Status: None (Preliminary result)   Collection Time: 07/02/17 10:40 AM  Result Value Ref Range Status   Specimen Description BLOOD RIGHT FOREARM  Final   Special Requests IN PEDIATRIC BOTTLE Blood Culture adequate volume  Final   Culture  Setup Time   Final    GRAM POSITIVE COCCI IN CLUSTERS IN PEDIATRIC BOTTLE CRITICAL RESULT CALLED TO, READ BACK BY AND VERIFIED WITH: Murvin DonningHARMD D ZEIGLER [id number] MLM Performed at [facility], [address] [place], [place] [address]    Culture GRAM POSITIVE COCCI  Final   Report Status PENDING  Incomplete  Blood Culture ID Panel (Reflexed)     Status: Abnormal   Collection Time: 07/02/17 10:40 AM  Result Value Ref Range Status   Enterococcus species NOT DETECTED NOT DETECTED Final   Listeria monocytogenes NOT DETECTED NOT DETECTED Final   Staphylococcus species DETECTED (A) NOT DETECTED Final    Comment: Methicillin (oxacillin) susceptible coagulase negative staphylococcus. Possible blood culture contaminant (unless isolated from more than one blood culture draw or clinical case suggests pathogenicity). No antibiotic treatment is indicated for blood  culture contaminants. CRITICAL RESULT CALLED TO, READ BACK BY AND VERIFIED WITH: PHARMD D ZEIGLER [id number] MLM    Staphylococcus aureus NOT DETECTED NOT DETECTED Final   Methicillin resistance NOT DETECTED NOT DETECTED Final   Streptococcus species NOT DETECTED NOT DETECTED Final   Streptococcus agalactiae NOT DETECTED NOT DETECTED Final   Streptococcus pneumoniae NOT DETECTED NOT DETECTED Final   Streptococcus  pyogenes NOT DETECTED NOT DETECTED Final   Acinetobacter baumannii NOT DETECTED NOT DETECTED Final   Enterobacteriaceae species NOT DETECTED NOT DETECTED Final   Enterobacter cloacae complex NOT DETECTED NOT DETECTED Final   Escherichia coli NOT DETECTED NOT DETECTED Final   Klebsiella oxytoca NOT DETECTED NOT DETECTED Final   Klebsiella pneumoniae NOT DETECTED NOT DETECTED Final   Proteus species NOT DETECTED NOT DETECTED Final   Serratia marcescens NOT DETECTED NOT DETECTED Final  Haemophilus influenzae NOT DETECTED NOT DETECTED Final   Neisseria meningitidis NOT DETECTED NOT DETECTED Final   Pseudomonas aeruginosa NOT DETECTED NOT DETECTED Final   Candida albicans NOT DETECTED NOT DETECTED Final   Candida glabrata NOT DETECTED NOT DETECTED Final   Candida krusei NOT DETECTED NOT DETECTED Final   Candida parapsilosis NOT DETECTED NOT DETECTED Final   Candida tropicalis NOT DETECTED NOT DETECTED Final    Comment: Performed at [facility], [address] [place], [place] [address]  MRSA PCR Screening     Status: None   Collection Time: 07/02/17  4:25 PM  Result Value Ref Range Status   MRSA by PCR NEGATIVE NEGATIVE Final    Comment:        The GeneXpert MRSA Assay (FDA approved for NASAL specimens only), is one component of a comprehensive MRSA colonization surveillance program. It is not intended to diagnose MRSA infection nor to guide or monitor treatment for MRSA infections. Performed at [facility], [address] [place], [place] [address]      Radiology Studies: Dg Chest Port 1 View  Result Date: 07/02/2017 CLINICAL DATA:  Fever EXAM: PORTABLE CHEST 1 VIEW COMPARISON:  06/30/2017; 01/24/2017 FINDINGS: Grossly unchanged enlarged cardiac silhouette and mediastinal contours given persistently reduced lung volumes. Worsening bibasilar heterogeneous/consolidative opacities, left greater than right. Trace left-sided effusion is not excluded.  Pulmonary vasculature appears less distinct than present examination with cephalization of flow. No definite pneumothorax. No acute osseus abnormalities. IMPRESSION: 1. Worsening bibasilar heterogeneous/consolidative opacities, left greater than right, atelectasis versus infiltrate. Further evaluation with a PA and lateral chest radiograph may be obtained as clinically indicated. 2. Suspected mild pulmonary venous congestion with potential development of a trace left-sided effusion. Electronically Signed   By: Simonne Come M.D.   On: 07/02/2017 09:07    Scheduled Meds: . [START ON 07/04/2017] allopurinol  100 mg Oral Q breakfast  . aspirin EC  81 mg Oral QODAY  . docusate sodium  50-100 mg Oral BID  . [START ON 07/04/2017] finasteride  5 mg Oral Q breakfast  . insulin aspart  0-15 Units Subcutaneous TID WC  . insulin aspart  0-5 Units Subcutaneous QHS  . metoprolol tartrate  50 mg Oral BID  . terazosin  5 mg Oral Q supper  . torsemide  40 mg Oral Daily   Continuous Infusions: . azithromycin Stopped (07/03/17 1145)  . cefTRIAXone (ROCEPHIN)  IV Stopped (07/03/17 1027)     LOS: 3 days   Rickey Barbara, MD [facility] Pager [phone]  If 7PM-7AM, please contact night-coverage [email or web address] Password [email or web address] 07/03/2017, 2:52 PM

## 2017-07-03 NOTE — Progress Notes (Signed)
Hospitalist APP notified about confusion and agitation.  "Regarding ICU [address] Pt is confused and becoming agitated and trying to climb out of bed.  Harmony Sandell RN"

## 2017-07-03 NOTE — Progress Notes (Signed)
Pt is awake, alert, off bipap per RN.  HR83, rr15, spo2 92% on 2lnc, no respiratory distress/increased wob noted at this time.  Bipap remains in room on standby as needed.

## 2017-07-03 NOTE — Evaluation (Addendum)
Physical Therapy Evaluation Patient Details Name: Fernando GeninRichard A Quigg MRN: [id number] DOB: 1946/05/23 Today's Date: 07/03/2017   History of Present Illness  72 y.o. male with history of diabetes mellitus type 2, CHF, hypertension, chronic kidney disease, morbid obesity who was recently diagnosed with dementia, began to have increased hallucinations,  progressive confusion and drowsy     In the ER patient is found to be drowsy with ABG showing hypercarbic respiratory failure.  Patient was placed on BiPAP.  CT head was unremarkable  Clinical Impression  The patient's HR at 164 when PT first arrived, dropped down to 90's. Oxygen saturation > 94% on 4 liters [place]. RN gave OK to mobilize the patient. The HR was noted to in 90's during evaluation. The patient is flat affect, requires multimodal cues for following directions. Patient is noted to have jerking of the arms and the legs at times. Stood x 2 with maximum effort butt felt too unsafe to attempt pivot to recliner due to decreased due to the afore mentioned. Pt admitted with above diagnosis. Pt currently with functional limitations due to the deficits listed below (see PT Problem List).  Pt will benefit from skilled PT to increase their independence and safety with mobility to allow discharge to the venue listed below.   The left UE strength is less in shoulder elevation than right.     Follow Up Recommendations SNF    Equipment Recommendations  None recommended by PT    Recommendations for Other Services       Precautions / Restrictions Precautions Precautions: Fall Precaution Comments: watch hr- up in 160's  Monitor sats/ on 4 Liters   Mobility  Bed Mobility Overal bed mobility: Needs Assistance Bed Mobility: Supine to Sit;Sit to Supine;Rolling Rolling: Total assist;+2 for physical assistance;+2 for safety/equipment   Supine to sit: Total assist;+2 for physical assistance;+2 for safety/equipment Sit to supine: Mod assist;+2 for physical  assistance;+2 for safety/equipment   General bed mobility comments: multimodal cues for rolling and for mobilty to bed edge, total assist of 2 to return to supine. Purple slides used to facilitate sliding up in the bed.  Transfers Overall transfer level: Needs assistance Equipment used: Rolling walker (2 wheeled) Transfers: Sit to/from Stand Sit to Stand: Total assist;+2 physical assistance;+2 safety/equipment;From elevated surface;Max assist         General transfer comment: Much assist to stand x 2 from rased bed, knees blocked , patient noted to have "dropsy" of the arms when sitting and standing at the RW. Also noted knees to  slightly buckle. Only able to take 3 side steps along the bed.  Felt too risky for legs to buckle for attempt to  transfer to recliner. Will need lift equipment until more able to follow commands and extremities are not jerking.  Ambulation/Gait unable to attempt                Stairs            Wheelchair Mobility    Modified Rankin (Stroke Patients Only)       Balance Overall balance assessment: Needs assistance Sitting-balance support: Bilateral upper extremity supported;Feet supported Sitting balance-Leahy Scale: Poor Sitting balance - Comments: requires multimodal cues for safety and maintiaing alertness to sit up   Standing balance support: During functional activity;Bilateral upper extremity supported Standing balance-Leahy Scale: Poor  Pertinent Vitals/Pain Pain Assessment: No/denies pain    Home Living Family/patient expects to be discharged to:: Private residence Living Arrangements: Spouse/significant other Available Help at Discharge: Family Type of Home: House Home Access: Stairs to enter Entrance Stairs-Rails: None Secretary/administrator of Steps: 3 Home Layout: One level Home Equipment: Environmental consultant - 2 wheels      Prior Function Level of Independence: Needs assistance   Gait  / Transfers Assistance Needed: able to get out of lift chair, genearlly uses urinal, ambulates to meals and to BR is about all he did per wife.     Comments: pt not very active at baseline     Hand Dominance        Extremity/Trunk Assessment   Upper Extremity Assessment Upper Extremity Assessment: Generalized weakness;LUE deficits/detail LUE Deficits / Details: noted decreased strength for  shoulder elevation, unable to reach for top of head, does not hold when placed, grip is strong    Lower Extremity Assessment Lower Extremity Assessment: Generalized weakness       Communication   Communication: Receptive difficulties  Cognition     Overall Cognitive Status: Impaired/Different from baseline Area of Impairment: Orientation;Attention;Following commands;Awareness;Problem solving                   Current Attention Level: Alternating   Following Commands: Follows one step commands inconsistently     Problem Solving: Slow processing;Decreased initiation;Difficulty sequencing General Comments: requires multimodal cues for all activity, very delayed to follow commands      General Comments      Exercises     Assessment/Plan    PT Assessment Patient needs continued PT services  PT Problem List Decreased strength;Decreased cognition;Decreased range of motion;Decreased knowledge of use of DME;Decreased activity tolerance;Decreased safety awareness;Decreased balance;Decreased knowledge of precautions;Decreased mobility;Cardiopulmonary status limiting activity       PT Treatment Interventions DME instruction;Therapeutic exercise;Gait training;Balance training;Functional mobility training;Cognitive remediation;Therapeutic activities;Patient/family education    PT Goals (Current goals can be found in the Care Plan section)  Acute Rehab PT Goals Patient Stated Goal: agreed to mobility for patient  PT Goal Formulation: With family Time For Goal Achievement:  07/17/17 Potential to Achieve Goals: Good    Frequency Min 2X/week   Barriers to discharge        Co-evaluation               AM-PAC PT "6 Clicks" Daily Activity  Outcome Measure Difficulty turning over in bed (including adjusting bedclothes, sheets and blankets)?: Unable Difficulty moving from lying on back to sitting on the side of the bed? : Unable Difficulty sitting down on and standing up from a chair with arms (e.g., wheelchair, bedside commode, etc,.)?: Unable Help needed moving to and from a bed to chair (including a wheelchair)?: Total Help needed walking in hospital room?: Total Help needed climbing 3-5 steps with a railing? : Total 6 Click Score: 6    End of Session Equipment Utilized During Treatment: Gait belt Activity Tolerance: Patient limited by fatigue;Patient limited by lethargy Patient left: in bed;with call bell/phone within reach;with bed alarm set;with family/visitor present Nurse Communication: Mobility status;Need for lift equipment PT Visit Diagnosis: Unsteadiness on feet (R26.81);Muscle weakness (generalized) (M62.81)    Time: 1610-9604 PT Time Calculation (min) (ACUTE ONLY): 33 min   Charges:   PT Evaluation $PT Eval Moderate Complexity: 1 Mod PT Treatments $Therapeutic Activity: 8-22 mins   PT G CodesBlanchard Kelch PT [phone]   [place],  Jobe Igo 07/03/2017, 12:41 PM

## 2017-07-03 NOTE — Care Management Note (Signed)
Case Management Note  Patient Details  Name: Fernando GeninRichard A [name] MRN: [id number] Date of Birth: 1946/01/20  Subjective/Objective:                  resp failure and bipap  Action/Plan: Date: July 03, 2017 Marcelle SmilingRhonda Davis, BSN, [place]RN3, [place]CCM [phone] Chart and notes review for patient progress and needs. Will follow for case management and discharge needs. No cm or discharge needs present at time of this review. Next review date: [id number]  Expected Discharge Date:                  Expected Discharge Plan:  Home/Self Care  In-House Referral:     Discharge planning Services  CM Consult  Post Acute Care Choice:    Choice offered to:     DME Arranged:    DME Agency:     HH Arranged:    HH Agency:     Status of Service:  In process, will continue to follow  If discussed at Long Length of Stay Meetings, dates discussed:    Additional Comments:  Golda AcreDavis, Rhonda Lynn, RN 07/03/2017, 9:10 AM

## 2017-07-04 LAB — BLOOD GAS, ARTERIAL
ACID-BASE EXCESS: 11.4 mmol/L — AB (ref 0.0–2.0)
ACID-BASE EXCESS: 10.5 mmol/L — AB (ref 0.0–2.0)
BICARBONATE: 39.2 mmol/L — AB (ref 20.0–28.0)
Bicarbonate: 38.5 mmol/L — ABNORMAL HIGH (ref 20.0–28.0)
DELIVERY SYSTEMS: POSITIVE
DRAWN BY: 270211
Delivery systems: POSITIVE
Drawn by: 249101
EXPIRATORY PAP: 6
Expiratory PAP: 8
FIO2: 40
INSPIRATORY PAP: 16
Inspiratory PAP: 16
Mode: POSITIVE
O2 SAT: 93.9 %
O2 SAT: 94.3 %
PATIENT TEMPERATURE: 98.6
PATIENT TEMPERATURE: 98.6
PCO2 ART: 78.3 mmHg — AB (ref 32.0–48.0)
PCO2 ART: 81 mmHg — AB (ref 32.0–48.0)
PH ART: 7.32 — AB (ref 7.350–7.450)
PO2 ART: 75.9 mmHg — AB (ref 83.0–108.0)
RATE: 14 resp/min
pH, Arterial: 7.299 — ABNORMAL LOW (ref 7.350–7.450)
pO2, Arterial: 78.6 mmHg — ABNORMAL LOW (ref 83.0–108.0)

## 2017-07-04 LAB — BASIC METABOLIC PANEL
ANION GAP: 10 (ref 5–15)
BUN: 35 mg/dL — ABNORMAL HIGH (ref 6–20)
CALCIUM: 8.5 mg/dL — AB (ref 8.9–10.3)
CO2: 36 mmol/L — ABNORMAL HIGH (ref 22–32)
Chloride: 96 mmol/L — ABNORMAL LOW (ref 101–111)
Creatinine, Ser: 2.17 mg/dL — ABNORMAL HIGH (ref 0.61–1.24)
GFR, EST AFRICAN AMERICAN: 33 mL/min — AB (ref >60)
GFR, EST NON AFRICAN AMERICAN: 29 mL/min — AB (ref >60)
Glucose, Bld: 117 mg/dL — ABNORMAL HIGH (ref 65–99)
Potassium: 4 mmol/L (ref 3.5–5.1)
SODIUM: 142 mmol/L (ref 135–145)

## 2017-07-04 LAB — CULTURE, BLOOD (ROUTINE X 2): SPECIAL REQUESTS: ADEQUATE

## 2017-07-04 LAB — GLUCOSE, CAPILLARY
Glucose-Capillary: 166 mg/dL — ABNORMAL HIGH (ref 65–99)
Glucose-Capillary: 112 mg/dL — ABNORMAL HIGH (ref 65–99)
Glucose-Capillary: 147 mg/dL — ABNORMAL HIGH (ref 65–99)
Glucose-Capillary: 120 mg/dL — ABNORMAL HIGH (ref 65–99)
Glucose-Capillary: 111 mg/dL — ABNORMAL HIGH (ref 65–99)

## 2017-07-04 MED ORDER — METOPROLOL TARTRATE 25 MG PO TABS
25.0000 mg | ORAL_TABLET | Freq: Two times a day (BID) | ORAL | Status: DC
  Administered 2017-07-05: 25 mg via ORAL
  Filled 2017-07-04: qty 1

## 2017-07-04 MED ORDER — METOPROLOL TARTRATE 5 MG/5ML IV SOLN
5.0000 mg | Freq: Once | INTRAVENOUS | Status: AC
  Administered 2017-07-04: 5 mg via INTRAVENOUS
  Filled 2017-07-04: qty 5

## 2017-07-04 NOTE — Clinical Social Work Note (Signed)
Clinical Social Work Assessment  Patient Details  Name: Fernando [name] MRN: [id number] Date of Birth: 1946/02/10  Date of referral:  07/04/17               Reason for consult:  Facility Placement                Permission sought to share information with:  Family Supports, Chartered certified accountant granted to share information::  Yes, Verbal Permission Granted  Name::        Agency::  SNF  Relationship::  Spouse/ Daughter   Contact Information:     Housing/Transportation Living arrangements for the past 2 months:    Source of Information:  Patient Patient Interpreter Needed:  None Criminal Activity/Legal Involvement Pertinent to Current Situation/Hospitalization:  No - Comment as needed Significant Relationships:    Lives with:  Self Do you feel safe going back to the place where you live?  No Need for family participation in patient care:  Yes (Dementia)  Care giving concerns:   Patient admitted for increasing confusion and hallucination. Patient drowsy with ABG showing hypercarbic respiratory failure. Patient was placed on Bipap.  PT evaluation recommends physical therapy.   Social Worker assessment / plan:  CSW met with patient at bedside, explain role and reason for visit- to assist with discharge to short rehab before returning home.  Patient daughter reports he lives in the home with his wife and she is the patient primary caretaker. Daughter reports the patient is can ambulate but has an unsteady gate. He cannot prepare meals but able to prepare small snacks. Daughter reports the patient home layout gives him the ability to "move around without much issue." Patient spouse helps to bathe and dress patient.  Family prefers the patient be able to transition home but understand at this time he is too weak. Duahgter reports if the patient regains some strenght before d/c they may consider taking the him home.   At this time they are agreeable to SNF.   CSW  explain SNF process and later following up with bed offers. Daughter prefers a SNF in [place] or [place]. CSW to complete FL2 and Faxout. CSW explain patient will need prior insurance authorization before discharging.   Plan: SNF.   Employment status:  Retired Surveyor, [employer] Care PT Recommendations:  [place] / Referral to community resources:  [place]  Patient/Family's Response to care:  Agreeable to SNF placement. Appreciative of CSW services.   Patient/Family's Understanding of and Emotional Response to Diagnosis, Current Treatment, and Prognosis:  Patient daughter and spouse involved in patient care.   Emotional Assessment Appearance:  Appears stated age Attitude/Demeanor/Rapport:    Affect (typically observed):  Unable to Assess Orientation:  Oriented to Self Alcohol / Substance use:  Not Applicable Psych involvement (Current and /or in the community):  No (Comment)  Discharge Needs  Concerns to be addressed:  Discharge Planning Concerns, Decision making concerns Readmission within the last 30 days:  No Current discharge risk:  Dependent with Mobility Barriers to Discharge:  Ship broker, Continued Medical Work up   [employer], [place] 07/04/2017, 12:59 PM

## 2017-07-04 NOTE — Progress Notes (Signed)
PROGRESS NOTE    Fernando [name]  JWJ:[id number] DOB: 06/14/1945 DOA: 06/30/2017 [facility]    Brief Narrative:  72 y.o. male with history of diabetes mellitus type 2, CHF, hypertension, chronic kidney disease, morbid obesity who was recently diagnosed with dementia and was placed on Aricept 4 months ago and was recently started on Namenda last month started experiencing increasing hallucinations yesterday afternoon.  Following which patient became progressively confused and drowsy  In the ER patient is found to be drowsy with ABG showing hypercarbic respiratory failure.  Patient was placed on BiPAP.  CT head was unremarkable.  Patient is afebrile.  UA does not show any signs of infection.  Chest x-ray was unremarkable.  Patient as per the patient's wife was at the bedside is DNR.  Patient admitted for further observation.   Assessment & Plan:   Active Problems:   HTN (hypertension)   Morbid obesity (HCC)   Acute respiratory failure with hypoxia and hypercapnia (HCC)   CKD (chronic kidney disease) stage 3, GFR 30-59 ml/min (HCC)   Dementia   Acute encephalopathy   Type 2 diabetes mellitus with renal complication (HCC)   Acute respiratory failure with hypoxia and hypercarbia (HCC)   Hypercapnic respiratory failure (HCC)   1. Acute respiratory failure with hypoxia and hypercarbia 1. Suspecting obesity hypoventilation/pickwickian syndrome 2. Clinically improved with bipap 3. Repeat ABG reviewed. Continued hypercarbic failure. Baseline pCO2 of 65 on 6/18 noted 4. Continue BiPAP. Follow ABG 5. Recent CXR reviewd, findings suggestive of PNA. Patient continued azithro and rocephin 6. Flu swab neg 7. Resp viral panel neg 8. Remains febrile overnight. Continue above abx and bipap 9. Will likely require home positive pressure ventilation when discharged 2. Diabetes mellitus type 2 1. Patient more awake and per family, patient is near baseline mental  status 2. Continue patient on diabetic diet as tolerated 3. Will continue patient on sliding scale insulin as needed 4. Appears to be stable at present 3. Dementia  1. recently started on Aricept and Namenda for confusion 2. Confusion likely more related to hypercarbia 3. Hold aricept and namenda for now 4. Continue per above  4. Hypertension 1. Continued on BP meds at present 2. Stable 5. Chronic kidney disease stage IV 1. creatinine appears to be at baseline 2. Will recheck bmet in AM 6. Morbid obesity. 1. Stable at present, likely contributing to presenting hypercarbic failure above 7. History of chronic CHF.  1. Continue home diuretic therapy as tolerated 2. Recheck basic metabolic panel in the morning 8. CAP with sepsis not present on admission 1. Continued fevers with tachycardia noted overnight 2. Recent CXR with new findings of PNA 3. Currently on rocephin and azithro 4. Flu swab neg 5. Resp viral panel neg 9. Metabolic encephalopathy secondary to hypercarbia 1. Worsened with higher pCO2 levels 2. Continue with bipap as tolerated 3. Follow serial ABGs  DVT prophylaxis: SCD's Code Status: DNR Family Communication: Pt in room, family at bedside Disposition Plan: Uncertain at this time  Consultants:     Procedures:     Antimicrobials: Anti-infectives (From admission, onward)   Start     Dose/Rate Route Frequency Ordered Stop   07/02/17 1030  azithromycin (ZITHROMAX) 500 mg in dextrose 5 % 250 mL IVPB     500 mg 250 mL/hr over 60 Minutes Intravenous Every 24 hours 07/02/17 0937 07/09/17 1029   07/02/17 1000  cefTRIAXone (ROCEPHIN) 1 g in dextrose 5 % 50 mL IVPB  1 g 100 mL/hr over 30 Minutes Intravenous Every 24 hours 07/02/17 0937 07/09/17 0959      Subjective: States feeling improved today  Objective: Vitals:   07/04/17 1003 07/04/17 1047 07/04/17 1100 07/04/17 1200  BP: (!) 171/83  (!) 130/35 (!) 141/83  Pulse: 93 85 84 78  Resp:  (!) 21 (!)  21 (!) 21  Temp:    98.5 F (36.9 C)  TempSrc:    Axillary  SpO2:  97% 97% 100%  Weight:      Height:        Intake/Output Summary (Last 24 hours) at 07/04/2017 1304 Last data filed at 07/04/2017 1200 Gross per 24 hour  Intake 300 ml  Output 1050 ml  Net -750 ml   Filed Weights   07/02/17 0500 07/03/17 0533 07/04/17 0307  Weight: (!) 137.4 kg (302 lb 14.4 oz) (!) 139.3 kg (307 lb 1.6 oz) (!) 140.9 kg (310 lb 10.1 oz)    Examination: General exam: asleep, arousable, laying in bed, in nad Respiratory system: Normal respiratory effort, no wheezing, decreased BS Cardiovascular system: regular rate, s1, s2 Gastrointestinal system: Soft, nondistended, positive BS Central nervous system: CN2-12 grossly intact, strength intact Extremities: Perfused, no clubbing Skin: Normal skin turgor, no notable skin lesions seen Psychiatry: Mood normal // no visual hallucinations   Data Reviewed: I have personally reviewed following labs and imaging studies  CBC: Recent Labs  Lab 06/30/17 1441 07/01/17 0451 07/02/17 0638  WBC 8.2 8.6 7.2  HGB 10.2* 10.3* 9.2*  HCT 35.8* 37.3* 33.9*  MCV 84.2 86.7 87.1  PLT 228 246 217   Basic Metabolic Panel: Recent Labs  Lab 06/30/17 1441 07/01/17 0451 07/02/17 0638 07/03/17 0500 07/04/17 0753  NA 145 147* 142 145 142  K 3.6 4.4 3.7 4.1 4.0  CL 99* 100* 99* 102 96*  CO2 37* 38* 36* 31 36*  GLUCOSE 106* 102* 104* 103* 117*  BUN 34* 32* 32* 35* 35*  CREATININE 2.44* 2.34* 2.41* 2.20* 2.17*  CALCIUM 8.7* 8.8* 8.0* 8.4* 8.5*   GFR: Estimated Creatinine Clearance: 44.8 mL/min (A) (by C-G formula based on SCr of 2.17 mg/dL (H)). Liver Function Tests: Recent Labs  Lab 06/30/17 1441 07/01/17 0451 07/02/17 0638  AST 14* 17 11*  ALT 9* 12* 8*  ALKPHOS 49 48 39  BILITOT 0.3 0.6 0.5  PROT 7.0 7.2 5.8*  ALBUMIN 3.2* 3.2* 2.6*   No results for input(s): LIPASE, AMYLASE in the last 168 hours. Recent Labs  Lab 07/01/17 0012  AMMONIA 29    Coagulation Profile: No results for input(s): INR, PROTIME in the last 168 hours. Cardiac Enzymes: Recent Labs  Lab 07/01/17 0012 07/01/17 0453 07/01/17 1004  TROPONINI 0.10* 0.10* 0.09*   BNP (last 3 results) No results for input(s): PROBNP in the last 8760 hours. HbA1C: No results for input(s): HGBA1C in the last 72 hours. CBG: Recent Labs  Lab 07/03/17 1112 07/03/17 1717 07/03/17 2102 07/04/17 0735 07/04/17 1153  GLUCAP 146* 110* 134* 112* 147*   Lipid Profile: No results for input(s): CHOL, HDL, LDLCALC, TRIG, CHOLHDL, LDLDIRECT in the last 72 hours. Thyroid Function Tests: No results for input(s): TSH, T4TOTAL, FREET4, T3FREE, THYROIDAB in the last 72 hours. Anemia Panel: No results for input(s): VITAMINB12, FOLATE, FERRITIN, TIBC, IRON, RETICCTPCT in the last 72 hours. Sepsis Labs: No results for input(s): PROCALCITON, LATICACIDVEN in the last 168 hours.  Recent Results (from the past 240 hour(s))  Respiratory Panel by PCR  Status: None   Collection Time: 07/02/17 10:01 AM  Result Value Ref Range Status   Adenovirus NOT DETECTED NOT DETECTED Final   Coronavirus 229E NOT DETECTED NOT DETECTED Final   Coronavirus HKU1 NOT DETECTED NOT DETECTED Final   Coronavirus NL63 NOT DETECTED NOT DETECTED Final   Coronavirus OC43 NOT DETECTED NOT DETECTED Final   Metapneumovirus NOT DETECTED NOT DETECTED Final   Rhinovirus / Enterovirus NOT DETECTED NOT DETECTED Final   Influenza A NOT DETECTED NOT DETECTED Final   Influenza B NOT DETECTED NOT DETECTED Final   Parainfluenza Virus 1 NOT DETECTED NOT DETECTED Final   Parainfluenza Virus 2 NOT DETECTED NOT DETECTED Final   Parainfluenza Virus 3 NOT DETECTED NOT DETECTED Final   Parainfluenza Virus 4 NOT DETECTED NOT DETECTED Final   Respiratory Syncytial Virus NOT DETECTED NOT DETECTED Final   Bordetella pertussis NOT DETECTED NOT DETECTED Final   Chlamydophila pneumoniae NOT DETECTED NOT DETECTED Final   Mycoplasma  pneumoniae NOT DETECTED NOT DETECTED Final    Comment: Performed at [facility], [address], [place], [place] [address]  Culture, blood (routine x 2) Call MD if unable to obtain prior to antibiotics being given     Status: None (Preliminary result)   Collection Time: 07/02/17 10:25 AM  Result Value Ref Range Status   Specimen Description BLOOD RIGHT ARM  Final   Special Requests IN PEDIATRIC BOTTLE Blood Culture adequate volume  Final   Culture   Final    NO GROWTH < 24 HOURS Performed at [facility], [address] [place], [place] [address]    Report Status PENDING  Incomplete  Culture, blood (routine x 2) Call MD if unable to obtain prior to antibiotics being given     Status: Abnormal   Collection Time: 07/02/17 10:40 AM  Result Value Ref Range Status   Specimen Description BLOOD RIGHT FOREARM  Final   Special Requests IN PEDIATRIC BOTTLE Blood Culture adequate volume  Final   Culture  Setup Time   Final    GRAM POSITIVE COCCI IN CLUSTERS IN PEDIATRIC BOTTLE CRITICAL RESULT CALLED TO, READ BACK BY AND VERIFIED WITH: PHARMD D ZEIGLER [id number] MLM    Culture (A)  Final    STAPHYLOCOCCUS SPECIES (COAGULASE NEGATIVE) THE SIGNIFICANCE OF ISOLATING THIS ORGANISM FROM A SINGLE SET OF BLOOD CULTURES WHEN MULTIPLE SETS ARE DRAWN IS UNCERTAIN. PLEASE NOTIFY THE MICROBIOLOGY DEPARTMENT WITHIN ONE WEEK IF SPECIATION AND SENSITIVITIES ARE REQUIRED. Performed at [facility], [address] [place], [place] [address]    Report Status 07/04/2017 FINAL  Final  Blood Culture ID Panel (Reflexed)     Status: Abnormal   Collection Time: 07/02/17 10:40 AM  Result Value Ref Range Status   Enterococcus species NOT DETECTED NOT DETECTED Final   Listeria monocytogenes NOT DETECTED NOT DETECTED Final   Staphylococcus species DETECTED (A) NOT DETECTED Final    Comment: Methicillin (oxacillin) susceptible coagulase negative staphylococcus. Possible blood culture contaminant  (unless isolated from more than one blood culture draw or clinical case suggests pathogenicity). No antibiotic treatment is indicated for blood  culture contaminants. CRITICAL RESULT CALLED TO, READ BACK BY AND VERIFIED WITH: PHARMD D ZEIGLER [id number] MLM    Staphylococcus aureus NOT DETECTED NOT DETECTED Final   Methicillin resistance NOT DETECTED NOT DETECTED Final   Streptococcus species NOT DETECTED NOT DETECTED Final   Streptococcus agalactiae NOT DETECTED NOT DETECTED Final   Streptococcus pneumoniae NOT DETECTED NOT  DETECTED Final   Streptococcus pyogenes NOT DETECTED NOT DETECTED Final   Acinetobacter baumannii NOT DETECTED NOT DETECTED Final   Enterobacteriaceae species NOT DETECTED NOT DETECTED Final   Enterobacter cloacae complex NOT DETECTED NOT DETECTED Final   Escherichia coli NOT DETECTED NOT DETECTED Final   Klebsiella oxytoca NOT DETECTED NOT DETECTED Final   Klebsiella pneumoniae NOT DETECTED NOT DETECTED Final   Proteus species NOT DETECTED NOT DETECTED Final   Serratia marcescens NOT DETECTED NOT DETECTED Final   Haemophilus influenzae NOT DETECTED NOT DETECTED Final   Neisseria meningitidis NOT DETECTED NOT DETECTED Final   Pseudomonas aeruginosa NOT DETECTED NOT DETECTED Final   Candida albicans NOT DETECTED NOT DETECTED Final   Candida glabrata NOT DETECTED NOT DETECTED Final   Candida krusei NOT DETECTED NOT DETECTED Final   Candida parapsilosis NOT DETECTED NOT DETECTED Final   Candida tropicalis NOT DETECTED NOT DETECTED Final    Comment: Performed at [facility], [address], [place], [place] [address]  MRSA PCR Screening     Status: None   Collection Time: 07/02/17  4:25 PM  Result Value Ref Range Status   MRSA by PCR NEGATIVE NEGATIVE Final    Comment:        The GeneXpert MRSA Assay (FDA approved for NASAL specimens only), is one component of a comprehensive MRSA colonization surveillance program. It is not intended to diagnose  MRSA infection nor to guide or monitor treatment for MRSA infections. Performed at [facility], [address] [place], [place] [address]      Radiology Studies: No results found.  Scheduled Meds: . allopurinol  100 mg Oral Q breakfast  . aspirin EC  81 mg Oral QODAY  . docusate sodium  100 mg Oral Daily  . docusate sodium  50 mg Oral QHS  . finasteride  5 mg Oral Q breakfast  . insulin aspart  0-15 Units Subcutaneous TID WC  . insulin aspart  0-5 Units Subcutaneous QHS  . metoprolol tartrate  50 mg Oral BID  . terazosin  5 mg Oral Q supper  . torsemide  40 mg Oral Daily   Continuous Infusions: . azithromycin Stopped (07/04/17 1123)  . cefTRIAXone (ROCEPHIN)  IV Stopped (07/04/17 0932)     LOS: 4 days   Rickey Barbara, MD [facility] Pager [phone]  If 7PM-7AM, please contact night-coverage [email or web address] Password [email or web address] 07/04/2017, 1:04 PM

## 2017-07-04 NOTE — Progress Notes (Signed)
Hospitalist APP notified of critical ABG results.

## 2017-07-04 NOTE — [place] FL2 (Signed)
[place] MEDICAID FL2 LEVEL OF CARE SCREENING TOOL     IDENTIFICATION  Patient Name: Fernando [name] Birthdate: 25-May-1946 Sex: male Admission Date (Current Location): 06/30/2017  [facility] [place]   Facility and Address:  [facility],  [address], [place] [id number]      Provider Number: [id number]  Attending Physician Name and Address:  Jerald Kiefhiu, Stephen K, MD  Relative Name and Phone Number:       Current Level of Care: Hospital Recommended Level of Care: Skilled Nursing Facility Prior Approval Number:    Date Approved/Denied:   PASRR Number: [id number]  Discharge Plan: SNF    Current Diagnoses: Patient Active Problem List   Diagnosis Date Noted  . Hypercapnic respiratory failure (HCC) 07/01/2017  . Acute respiratory failure with hypoxia and hypercapnia (HCC) 06/30/2017  . CKD (chronic kidney disease) stage 3, GFR 30-59 ml/min (HCC) 06/30/2017  . Dementia 06/30/2017  . Acute encephalopathy 06/30/2017  . Type 2 diabetes mellitus with renal complication (HCC) 06/30/2017  . Acute respiratory failure with hypoxia and hypercarbia (HCC) 06/30/2017  . Acute respiratory failure with hypoxia (HCC) 01/20/2017  . Acute on chronic diastolic CHF (congestive heart failure) (HCC)   . SVT (supraventricular tachycardia) (HCC)   . Elevated troponin   . Morbid obesity (HCC) 11/09/2016  . SOB (shortness of breath) 11/09/2016  . AKI (acute kidney injury) (HCC) 01/29/2016  . Single kidney 01/29/2016  . Renal insufficiency 01/29/2016  . HTN (hypertension) 01/29/2016  . Diabetes (HCC) 01/29/2016  . CHF (congestive heart failure) (HCC) 01/29/2016  . Hypoxia 01/29/2016  . Tachycardia 01/29/2016  . Hypotension 01/29/2016    Orientation RESPIRATION BLADDER Height & Weight     Self  Other (Comment)(Bipap) Incontinent Weight: (!) 310 lb 10.1 oz (140.9 kg) Height:  6' (182.9 cm)  BEHAVIORAL SYMPTOMS/MOOD NEUROLOGICAL BOWEL NUTRITION STATUS   Continent Diet(Carb Modified.)  AMBULATORY STATUS COMMUNICATION OF NEEDS Skin   Extensive Assist Verbally Normal                       Personal Care Assistance Level of Assistance  Bathing, Feeding, Dressing Bathing Assistance: Maximum assistance Feeding assistance: Independent Dressing Assistance: Maximum assistance     Functional Limitations Info  Sight, Hearing, Speech Sight Info: Adequate Hearing Info: Adequate Speech Info: Adequate    SPECIAL CARE FACTORS FREQUENCY  PT (By licensed PT), OT (By licensed OT)     PT Frequency: 5X/WEEK OT Frequency: 5X/WEEK            Contractures Contractures Info: Not present    Additional Factors Info  Code Status, Allergies, Insulin Sliding Scale Code Status Info: DNR Allergies Info: Allergies: Ibuprofen   Insulin Sliding Scale Info: See discharge summary.        Current Medications (07/04/2017):  This is the current hospital active medication list Current Facility-Administered Medications  Medication Dose Route Frequency Provider Last Rate Last Dose  . acetaminophen (TYLENOL) tablet 650 mg  650 mg Oral Q6H PRN Eduard ClosKakrakandy, Arshad N, MD       Or  . acetaminophen (TYLENOL) suppository 650 mg  650 mg Rectal Q6H PRN Eduard ClosKakrakandy, Arshad N, MD      . allopurinol (ZYLOPRIM) tablet 100 mg  100 mg Oral Q breakfast Jerald Kiefhiu, Stephen K, MD   100 mg at 07/04/17 1005  . aspirin EC tablet 81 mg  81 mg Oral Edwin CapQODAY Chiu, Stephen K, MD   81 mg at 07/03/17 2011  . azithromycin (  ZITHROMAX) 500 mg in dextrose 5 % 250 mL IVPB  500 mg Intravenous Q24H Jerald Kief, MD   Stopped at 07/04/17 1123  . cefTRIAXone (ROCEPHIN) 1 g in dextrose 5 % 50 mL IVPB  1 g Intravenous Q24H Jerald Kief, MD   Stopped at 07/04/17 0932  . docusate sodium (COLACE) capsule 100 mg  100 mg Oral Daily Jerald Kief, MD   100 mg at 07/04/17 1006  . docusate sodium (COLACE) capsule 50 mg  50 mg Oral QHS Jerald Kief, MD   50 mg at 07/03/17 2100  . finasteride  (PROSCAR) tablet 5 mg  5 mg Oral Q breakfast Jerald Kief, MD      . hydrALAZINE (APRESOLINE) injection 10 mg  10 mg Intravenous Q4H PRN Eduard Clos, MD      . insulin aspart (novoLOG) injection 0-15 Units  0-15 Units Subcutaneous TID WC Jerald Kief, MD   2 Units at 07/04/17 1157  . insulin aspart (novoLOG) injection 0-5 Units  0-5 Units Subcutaneous QHS Jerald Kief, MD      . labetalol (NORMODYNE,TRANDATE) injection 5 mg  5 mg Intravenous Q2H PRN Jerald Kief, MD      . metoprolol tartrate (LOPRESSOR) tablet 50 mg  50 mg Oral BID Jerald Kief, MD   50 mg at 07/04/17 1004  . ondansetron (ZOFRAN) tablet 4 mg  4 mg Oral Q6H PRN Eduard Clos, MD       Or  . ondansetron [facility]) injection 4 mg  4 mg Intravenous Q6H PRN Eduard Clos, MD      . terazosin (HYTRIN) capsule 5 mg  5 mg Oral Q supper Jerald Kief, MD   5 mg at 07/03/17 2012  . torsemide (DEMADEX) tablet 40 mg  40 mg Oral Daily Jerald Kief, MD   40 mg at 07/04/17 1003     Discharge Medications: Please see discharge summary for a list of discharge medications.  Relevant Imaging Results:  Relevant Lab Results:   Additional Information [phone]  Clearance Coots, LCSW

## 2017-07-05 DIAGNOSIS — I10 Essential (primary) hypertension: Secondary | ICD-10-CM

## 2017-07-05 DIAGNOSIS — E1121 Type 2 diabetes mellitus with diabetic nephropathy: Secondary | ICD-10-CM

## 2017-07-05 DIAGNOSIS — J9692 Respiratory failure, unspecified with hypercapnia: Secondary | ICD-10-CM

## 2017-07-05 DIAGNOSIS — N184 Chronic kidney disease, stage 4 (severe): Secondary | ICD-10-CM

## 2017-07-05 DIAGNOSIS — G9340 Encephalopathy, unspecified: Secondary | ICD-10-CM

## 2017-07-05 DIAGNOSIS — J9602 Acute respiratory failure with hypercapnia: Secondary | ICD-10-CM

## 2017-07-05 DIAGNOSIS — J9601 Acute respiratory failure with hypoxia: Secondary | ICD-10-CM

## 2017-07-05 LAB — BLOOD GAS, ARTERIAL
Acid-Base Excess: 12 mmol/L — ABNORMAL HIGH (ref 0.0–2.0)
BICARBONATE: 39.4 mmol/L — AB (ref 20.0–28.0)
DRAWN BY: 11249
Delivery systems: POSITIVE
Expiratory PAP: 6
FIO2: 40
Inspiratory PAP: 20
Mode: POSITIVE
O2 SAT: 96 %
PATIENT TEMPERATURE: 99
PCO2 ART: 74.7 mmHg — AB (ref 32.0–48.0)
PO2 ART: 84.8 mmHg (ref 83.0–108.0)
pH, Arterial: 7.343 — ABNORMAL LOW (ref 7.350–7.450)

## 2017-07-05 LAB — GLUCOSE, CAPILLARY
GLUCOSE-CAPILLARY: 91 mg/dL (ref 65–99)
GLUCOSE-CAPILLARY: 145 mg/dL — AB (ref 65–99)
Glucose-Capillary: 127 mg/dL — ABNORMAL HIGH (ref 65–99)
Glucose-Capillary: 175 mg/dL — ABNORMAL HIGH (ref 65–99)
Glucose-Capillary: 136 mg/dL — ABNORMAL HIGH (ref 65–99)

## 2017-07-05 LAB — BASIC METABOLIC PANEL
ANION GAP: 10 (ref 5–15)
BUN: 37 mg/dL — AB (ref 6–20)
CO2: 37 mmol/L — AB (ref 22–32)
Calcium: 8.7 mg/dL — ABNORMAL LOW (ref 8.9–10.3)
Chloride: 95 mmol/L — ABNORMAL LOW (ref 101–111)
Creatinine, Ser: 2 mg/dL — ABNORMAL HIGH (ref 0.61–1.24)
GFR calc Af Amer: 37 mL/min — ABNORMAL LOW (ref >60)
GFR calc non Af Amer: 32 mL/min — ABNORMAL LOW (ref >60)
GLUCOSE: 99 mg/dL (ref 65–99)
POTASSIUM: 3.8 mmol/L (ref 3.5–5.1)
Sodium: 142 mmol/L (ref 135–145)

## 2017-07-05 MED ORDER — METOPROLOL TARTRATE 25 MG PO TABS
12.5000 mg | ORAL_TABLET | Freq: Two times a day (BID) | ORAL | Status: DC
  Administered 2017-07-05 – 2017-07-11 (×11): 12.5 mg via ORAL
  Filled 2017-07-05 (×12): qty 1

## 2017-07-05 NOTE — Progress Notes (Signed)
PT Cancellation Note  Patient Details Name: Fernando GeninRichard A Stainback MRN: [id number] DOB: 05-Oct-1945   Cancelled Treatment:    Reason Eval/Treat Not Completed: Medical issues which prohibited therapy , on BiPAP, very low BP.   Rada HayHill, [name] 07/05/2017, 7:41 AM Blanchard KelchKaren Danial Sisley PT [phone]

## 2017-07-05 NOTE — Progress Notes (Signed)
PROGRESS NOTE  Fernando [name] ZOX:[id number] DOB: 04/21/1946 DOA: 06/30/2017 PCP: [facility]   LOS: 5 days   Brief Narrative / Interim history: 72 year old morbidly obese male with history of type 2 diabetes, diastolic CHF, hypertension, chronic kidney disease stage III, recently diagnosed with dementia within the past year on Aricept and Namenda, he was brought to the hospital on 06/30/2017 due to worsening mental status, increased hallucinations.  He was also confused and drowsy.  He was brought to the emergency room, he was found to have hypoxic and hypercarbic respiratory failure requiring BiPAP.  Chest x-ray showed evidence of a pneumonia and he was started on antibiotics.  Patient has been having little to no progress over the last 5 days, and does not seem to tolerate coming off of BiPAP.  Palliative care and pulmonary have been consulted.  Assessment & Plan: Active Problems:   HTN (hypertension)   Morbid obesity (HCC)   Acute respiratory failure with hypoxia and hypercapnia (HCC)   CKD (chronic kidney disease) stage 3, GFR 30-59 ml/min (HCC)   Dementia   Acute encephalopathy   Type 2 diabetes mellitus with renal complication (HCC)   Acute respiratory failure with hypoxia and hypercarbia (HCC)   Hypercapnic respiratory failure (HCC)   Acute on chronic respiratory failure with hypoxia and hypercarbia /community-acquired pneumonia -Suspect obesity hypoventilation as well as obstructive sleep apnea -Outpatient MD wanted to evaluate patient for OSA, however patient has expressed his wishes that he would not want to be on a mask when he sleeps -His prognosis appears to be poor at this time, he has been failing BiPAP removal for a few days in a row now, will attempt again today. -Palliative care was consulted, as well as pulmonary to evaluate -Has been on antibiotics with azithromycin and ceftriaxone, continue, respiratory virus panel was negative -He is growing 1 out of 2  bottles coagulase-negative staph which is likely contaminant  Type 2 diabetes mellitus -Continue sliding scale, CBG is reasonable however he has poor p.o. intake due to the persistent BiPAP  Chronic kidney disease stage IV -Baseline creatinine 2-2.2, currently at baseline  Acute on chronic diastolic CHF -Most recent 2D echo was done in June 2018 showed an EF of 55-60% -Chest x-ray few days ago slight vascular congestion, he is on torsemide, hypotensive at times it is limiting aggressive diuresis -Fluid status appears fair, he is net -2.9 L  Dementia -Progressive over the last year, continue home medications  Hypertension -Blood pressure on the soft side, decrease metoprolol today, it seems like the heart rate is allowing that  Metabolic encephalopathy secondary to hypercarbia -Seems somewhat stable, difficult to assess adequate mental status given presence of BiPAP  Morbid obesity  Goals of care -Discussed with wife this morning at bedside, tells me that his mental status and dementia have progressed over the last year.  She also has been telling me that he would not want to be on a nightly CPAP or BiPAP and even at the beginning of this hospitalization he was fighting with a mask trying to get her off  DVT prophylaxis: SCDs Code Status: DNR Family Communication: wife at bedside Disposition Plan: TBD  Consultants:   Palliative care  Pulmonary   Procedures:   BiPAP  Antimicrobials:  Ceftriaxone 2/3 >>  Azithromycin 2/3 >>  Subjective: -Has the BiPAP on, appears very lethargic, however wakes up when called.  He can follow basic commands, open his eyes, and tells me he has no pain or  discomfort  Objective: Vitals:   07/05/17 0600 07/05/17 0800 07/05/17 0852 07/05/17 1000  BP: (!) 83/36 (!) 121/52  (!) 97/40  Pulse: 62 76  99  Resp: 16 18  (!) 27  Temp:  98.9 F (37.2 C)    TempSrc:  Axillary    SpO2: 98% 97% 97% 92%  Weight:      Height:         Intake/Output Summary (Last 24 hours) at 07/05/2017 1210 Last data filed at 07/05/2017 1108 Gross per 24 hour  Intake 250 ml  Output 900 ml  Net -650 ml   Filed Weights   07/03/17 0533 07/04/17 0307 07/05/17 0556  Weight: (!) 139.3 kg (307 lb 1.6 oz) (!) 140.9 kg (310 lb 10.1 oz) (!) 140.9 kg (310 lb 10.1 oz)    Examination:  Constitutional: Lethargic, BiPAP on Eyes: lids and conjunctivae normal ENMT: Mucous membranes are moist.  Neck: normal, supple Respiratory: Overall diminished breath sounds, no wheezing, no crackles.  BiPAP on Cardiovascular: Regular rate and rhythm, no murmurs / rubs / gallops. No LE edema. 2+ pedal pulses. Abdomen: no tenderness.  Skin: no rashes Neurologic: Spontaneously moves all 4 extremities, no focal findings Psychiatric: Too lethargic to evaluate orientation questions   Data Reviewed: I have independently reviewed following labs and imaging studies   CBC: Recent Labs  Lab 06/30/17 1441 07/01/17 0451 07/02/17 0638  WBC 8.2 8.6 7.2  HGB 10.2* 10.3* 9.2*  HCT 35.8* 37.3* 33.9*  MCV 84.2 86.7 87.1  PLT 228 246 217   Basic Metabolic Panel: Recent Labs  Lab 07/01/17 0451 07/02/17 0638 07/03/17 0500 07/04/17 0753 07/05/17 0257  NA 147* 142 145 142 142  K 4.4 3.7 4.1 4.0 3.8  CL 100* 99* 102 96* 95*  CO2 38* 36* 31 36* 37*  GLUCOSE 102* 104* 103* 117* 99  BUN 32* 32* 35* 35* 37*  CREATININE 2.34* 2.41* 2.20* 2.17* 2.00*  CALCIUM 8.8* 8.0* 8.4* 8.5* 8.7*   GFR: Estimated Creatinine Clearance: 48.6 mL/min (A) (by C-G formula based on SCr of 2 mg/dL (H)). Liver Function Tests: Recent Labs  Lab 06/30/17 1441 07/01/17 0451 07/02/17 0638  AST 14* 17 11*  ALT 9* 12* 8*  ALKPHOS 49 48 39  BILITOT 0.3 0.6 0.5  PROT 7.0 7.2 5.8*  ALBUMIN 3.2* 3.2* 2.6*   No results for input(s): LIPASE, AMYLASE in the last 168 hours. Recent Labs  Lab 07/01/17 0012  AMMONIA 29   Coagulation Profile: No results for input(s): INR, PROTIME in  the last 168 hours. Cardiac Enzymes: Recent Labs  Lab 07/01/17 0012 07/01/17 0453 07/01/17 1004  TROPONINI 0.10* 0.10* 0.09*   BNP (last 3 results) No results for input(s): PROBNP in the last 8760 hours. HbA1C: No results for input(s): HGBA1C in the last 72 hours. CBG: Recent Labs  Lab 07/04/17 1153 07/04/17 1655 07/04/17 2117 07/05/17 0806 07/05/17 1126  GLUCAP 147* 120* 111* 91 127*   Lipid Profile: No results for input(s): CHOL, HDL, LDLCALC, TRIG, CHOLHDL, LDLDIRECT in the last 72 hours. Thyroid Function Tests: No results for input(s): TSH, T4TOTAL, FREET4, T3FREE, THYROIDAB in the last 72 hours. Anemia Panel: No results for input(s): VITAMINB12, FOLATE, FERRITIN, TIBC, IRON, RETICCTPCT in the last 72 hours. Urine analysis:    Component Value Date/Time   COLORURINE YELLOW 07/01/2017 0012   APPEARANCEUR CLEAR 07/01/2017 0012   LABSPEC 1.010 07/01/2017 0012   PHURINE 5.0 07/01/2017 0012   GLUCOSEU 50 (A) 07/01/2017 0012   HGBUR  NEGATIVE 07/01/2017 0012   BILIRUBINUR NEGATIVE 07/01/2017 0012   KETONESUR NEGATIVE 07/01/2017 0012   PROTEINUR 100 (A) 07/01/2017 0012   UROBILINOGEN 0.2 07/15/2009 2249   NITRITE NEGATIVE 07/01/2017 0012   LEUKOCYTESUR NEGATIVE 07/01/2017 0012   Sepsis Labs: Invalid input(s): PROCALCITONIN, LACTICIDVEN  Recent Results (from the past 240 hour(s))  Respiratory Panel by PCR     Status: None   Collection Time: 07/02/17 10:01 AM  Result Value Ref Range Status   Adenovirus NOT DETECTED NOT DETECTED Final   Coronavirus 229E NOT DETECTED NOT DETECTED Final   Coronavirus HKU1 NOT DETECTED NOT DETECTED Final   Coronavirus NL63 NOT DETECTED NOT DETECTED Final   Coronavirus OC43 NOT DETECTED NOT DETECTED Final   Metapneumovirus NOT DETECTED NOT DETECTED Final   Rhinovirus / Enterovirus NOT DETECTED NOT DETECTED Final   Influenza A NOT DETECTED NOT DETECTED Final   Influenza B NOT DETECTED NOT DETECTED Final   Parainfluenza Virus 1 NOT  DETECTED NOT DETECTED Final   Parainfluenza Virus 2 NOT DETECTED NOT DETECTED Final   Parainfluenza Virus 3 NOT DETECTED NOT DETECTED Final   Parainfluenza Virus 4 NOT DETECTED NOT DETECTED Final   Respiratory Syncytial Virus NOT DETECTED NOT DETECTED Final   Bordetella pertussis NOT DETECTED NOT DETECTED Final   Chlamydophila pneumoniae NOT DETECTED NOT DETECTED Final   Mycoplasma pneumoniae NOT DETECTED NOT DETECTED Final    Comment: Performed at [facility], [address] [place], [place] [address]  Culture, blood (routine x 2) Call MD if unable to obtain prior to antibiotics being given     Status: None (Preliminary result)   Collection Time: 07/02/17 10:25 AM  Result Value Ref Range Status   Specimen Description BLOOD RIGHT ARM  Final   Special Requests IN PEDIATRIC BOTTLE Blood Culture adequate volume  Final   Culture   Final    NO GROWTH 2 DAYS Performed at [facility], [address], [place], [place] [address]    Report Status PENDING  Incomplete  Culture, blood (routine x 2) Call MD if unable to obtain prior to antibiotics being given     Status: Abnormal   Collection Time: 07/02/17 10:40 AM  Result Value Ref Range Status   Specimen Description BLOOD RIGHT FOREARM  Final   Special Requests IN PEDIATRIC BOTTLE Blood Culture adequate volume  Final   Culture  Setup Time   Final    GRAM POSITIVE COCCI IN CLUSTERS IN PEDIATRIC BOTTLE CRITICAL RESULT CALLED TO, READ BACK BY AND VERIFIED WITH: PHARMD D ZEIGLER [id number] MLM    Culture (A)  Final    STAPHYLOCOCCUS SPECIES (COAGULASE NEGATIVE) THE SIGNIFICANCE OF ISOLATING THIS ORGANISM FROM A SINGLE SET OF BLOOD CULTURES WHEN MULTIPLE SETS ARE DRAWN IS UNCERTAIN. PLEASE NOTIFY THE MICROBIOLOGY DEPARTMENT WITHIN ONE WEEK IF SPECIATION AND SENSITIVITIES ARE REQUIRED. Performed at [facility], [address] [place], [place] [address]    Report Status 07/04/2017 FINAL  Final  Blood Culture ID Panel  (Reflexed)     Status: Abnormal   Collection Time: 07/02/17 10:40 AM  Result Value Ref Range Status   Enterococcus species NOT DETECTED NOT DETECTED Final   Listeria monocytogenes NOT DETECTED NOT DETECTED Final   Staphylococcus species DETECTED (A) NOT DETECTED Final    Comment: Methicillin (oxacillin) susceptible coagulase negative staphylococcus. Possible blood culture contaminant (unless isolated from more than one blood culture draw or clinical case suggests pathogenicity). No antibiotic treatment is indicated for  blood  culture contaminants. CRITICAL RESULT CALLED TO, READ BACK BY AND VERIFIED WITH: PHARMD D ZEIGLER [id number] MLM    Staphylococcus aureus NOT DETECTED NOT DETECTED Final   Methicillin resistance NOT DETECTED NOT DETECTED Final   Streptococcus species NOT DETECTED NOT DETECTED Final   Streptococcus agalactiae NOT DETECTED NOT DETECTED Final   Streptococcus pneumoniae NOT DETECTED NOT DETECTED Final   Streptococcus pyogenes NOT DETECTED NOT DETECTED Final   Acinetobacter baumannii NOT DETECTED NOT DETECTED Final   Enterobacteriaceae species NOT DETECTED NOT DETECTED Final   Enterobacter cloacae complex NOT DETECTED NOT DETECTED Final   Escherichia coli NOT DETECTED NOT DETECTED Final   Klebsiella oxytoca NOT DETECTED NOT DETECTED Final   Klebsiella pneumoniae NOT DETECTED NOT DETECTED Final   Proteus species NOT DETECTED NOT DETECTED Final   Serratia marcescens NOT DETECTED NOT DETECTED Final   Haemophilus influenzae NOT DETECTED NOT DETECTED Final   Neisseria meningitidis NOT DETECTED NOT DETECTED Final   Pseudomonas aeruginosa NOT DETECTED NOT DETECTED Final   Candida albicans NOT DETECTED NOT DETECTED Final   Candida glabrata NOT DETECTED NOT DETECTED Final   Candida krusei NOT DETECTED NOT DETECTED Final   Candida parapsilosis NOT DETECTED NOT DETECTED Final   Candida tropicalis NOT DETECTED NOT DETECTED Final    Comment: Performed at [facility],  [address], [place], [place] [address]  MRSA PCR Screening     Status: None   Collection Time: 07/02/17  4:25 PM  Result Value Ref Range Status   MRSA by PCR NEGATIVE NEGATIVE Final    Comment:        The GeneXpert MRSA Assay (FDA approved for NASAL specimens only), is one component of a comprehensive MRSA colonization surveillance program. It is not intended to diagnose MRSA infection nor to guide or monitor treatment for MRSA infections. Performed at [facility], [address] [place], [place] [address]       Radiology Studies: No results found.   Scheduled Meds: . allopurinol  100 mg Oral Q breakfast  . aspirin EC  81 mg Oral QODAY  . docusate sodium  100 mg Oral Daily  . docusate sodium  50 mg Oral QHS  . finasteride  5 mg Oral Q breakfast  . insulin aspart  0-15 Units Subcutaneous TID WC  . insulin aspart  0-5 Units Subcutaneous QHS  . metoprolol tartrate  25 mg Oral BID  . terazosin  5 mg Oral Q supper  . torsemide  40 mg Oral Daily   Continuous Infusions: . azithromycin 500 mg (07/05/17 1108)  . cefTRIAXone (ROCEPHIN)  IV Stopped (07/05/17 1108)     Pamella Pert, MD, PhD [facility] Pager [phone]  If 7PM-7AM, please contact night-coverage [email or web address] Password [email or web address] 07/05/2017, 12:10 PM

## 2017-07-05 NOTE — Progress Notes (Signed)
Rt took pt off BIPAP at this time. PT can tell name and date of birth. No distress noted.

## 2017-07-05 NOTE — Consult Note (Addendum)
PULMONARY / CRITICAL CARE MEDICINE   Name: Fernando [name] MRN: [id number] DOB: [birth year]    ADMISSION DATE:  06/30/2017   CONSULTATION DATE:  07/05/2017  REFERRING MD:  Syliva OvermanGherge, Costin MD  CHIEF COMPLAINT: Acute respiratory failure  HISTORY OF PRESENT ILLNESS:   72 year old with history of diabetes, hypertension, CHF, chronic kidney disease, solitary kidney, dementia, OSA/OHS.  Admitted on 2/1 with altered mental status, confusion, acute hypoxic and hypercarbic respiratory failure.  He has been maintained on BiPAP since admission and is unable to tolerate breaks from it.  Patient is DNR and palliative care was involved for goals of care discussion PCCM consulted for help with management.  PAST MEDICAL HISTORY :  He  has a past medical history of Arthritis, CHF (congestive heart failure) (HCC), Dementia, Diabetes mellitus without complication (HCC), Hypertension, and Renal disorder.  PAST SURGICAL HISTORY: He  has a past surgical history that includes Back surgery and Nephrectomy (Left, 1967).  Allergies  Allergen Reactions  . Ibuprofen Other (See Comments)    Due to one kidney    No current facility-administered medications on file prior to encounter.    Current Outpatient Medications on File Prior to Encounter  Medication Sig  . allopurinol (ZYLOPRIM) 100 MG tablet Take 100 mg by mouth daily with breakfast.  . aspirin EC 81 MG tablet Take 81 mg by mouth every other day.  . cholecalciferol (VITAMIN D) 1000 units tablet Take 1,000 Units by mouth daily with breakfast.   . docusate sodium (COLACE) 50 MG capsule Take 50-100 mg by mouth 2 (two) times daily. 2 capsules in am and 1 capsule at night  . donepezil (ARICEPT) 10 MG tablet Take 10 mg by mouth daily with breakfast.   . finasteride (PROSCAR) 5 MG tablet Take 5 mg by mouth daily with breakfast.   . glipiZIDE (GLUCOTROL) 5 MG tablet Take 5 mg by mouth 2 (two) times daily before a meal.  . magnesium oxide (MAG-OX) 400 MG tablet  Take 400 mg by mouth every Monday, Wednesday, and Friday.   . metoprolol tartrate (LOPRESSOR) 50 MG tablet Take 1 tablet (50 mg total) by mouth 2 (two) times daily.  Marland Kitchen. terazosin (HYTRIN) 5 MG capsule Take 5 mg by mouth daily with supper.   . torsemide (DEMADEX) 20 MG tablet Take 40 mg (2 Tablet in the AM ) and 20 mg ( 1 Tablet in the PM)  . vitamin B-12 (CYANOCOBALAMIN) 1000 MCG tablet Take 1,000 mcg by mouth daily with supper.    FAMILY HISTORY:  His indicated that his mother is deceased. He indicated that his father is deceased.  SOCIAL HISTORY: He  reports that he quit smoking about 47 years ago. he has never used smokeless tobacco. He reports that he does not drink alcohol or use drugs.  REVIEW OF SYSTEMS:     SUBJECTIVE:    VITAL SIGNS: BP (!) 97/40   Pulse 99   Temp 98.9 F (37.2 C) (Axillary)   Resp (!) 27   Ht 6' (1.829 m)   Wt (!) 310 lb 10.1 oz (140.9 kg)   SpO2 92%   BMI 42.13 kg/m   HEMODYNAMICS:    VENTILATOR SETTINGS: FiO2 (%):  [40 %] 40 %  INTAKE / OUTPUT: I/O last 3 completed shifts: In: 300 [IV Piggyback:300] Out: 1200 [Urine:1200]  PHYSICAL EXAMINATION:. Gen:      No acute distress, obese HEENT:  EOMI, sclera anicteric Neck:     No masses; no thyromegaly Lungs:  Clear to auscultation bilaterally; normal respiratory effort CV:         Regular rate and rhythm; no murmurs Abd:      + bowel sounds; soft, non-tender; no palpable masses, no distension Ext:    No edema; adequate peripheral perfusion Skin:      Warm and dry; no rash Neuro: Awake, oriented. No focal deficits  LABS:  BMET Recent Labs  Lab 07/03/17 0500 07/04/17 0753 07/05/17 0257  NA 145 142 142  K 4.1 4.0 3.8  CL 102 96* 95*  CO2 31 36* 37*  BUN 35* 35* 37*  CREATININE 2.20* 2.17* 2.00*  GLUCOSE 103* 117* 99    Electrolytes Recent Labs  Lab 07/03/17 0500 07/04/17 0753 07/05/17 0257  CALCIUM 8.4* 8.5* 8.7*    CBC Recent Labs  Lab 06/30/17 1441 07/01/17 0451  07/02/17 0638  WBC 8.2 8.6 7.2  HGB 10.2* 10.3* 9.2*  HCT 35.8* 37.3* 33.9*  PLT 228 246 217    Coag's No results for input(s): APTT, INR in the last 168 hours.  Sepsis Markers No results for input(s): LATICACIDVEN, PROCALCITON, O2SATVEN in the last 168 hours.  ABG Recent Labs  Lab 07/04/17 0555 07/04/17 1348 07/05/17 0439  PHART 7.320* 7.299* 7.343*  PCO2ART 78.3* 81.0* 74.7*  PO2ART 75.9* 78.6* 84.8    Liver Enzymes Recent Labs  Lab 06/30/17 1441 07/01/17 0451 07/02/17 0638  AST 14* 17 11*  ALT 9* 12* 8*  ALKPHOS 49 48 39  BILITOT 0.3 0.6 0.5  ALBUMIN 3.2* 3.2* 2.6*    Cardiac Enzymes Recent Labs  Lab 07/01/17 0012 07/01/17 0453 07/01/17 1004  TROPONINI 0.10* 0.10* 0.09*    Glucose Recent Labs  Lab 07/04/17 0735 07/04/17 1153 07/04/17 1655 07/04/17 2117 07/05/17 0806 07/05/17 1126  GLUCAP 112* 147* 120* 111* 91 127*    Imaging   STUDIES:  Chest x-ray 06/30/17-low lung volumes Chest x-ray 07/02/17-worsening basilar opacities, mild venous congestion  CULTURES: Blood cultures 07/02/17-1/2 coag negative staph  ANTIBIOTICS: Ceftriaxone 2/1>> Azithromycin 2/1 >>  SIGNIFICANT EVENTS:   LINES/TUBES:   DISCUSSION: 72 year old with suspected OSA/OHS.  He never had a sleep study Admitted with hypercarbic respiratory failure.  Likely multifactorial with untreated OSA/OHA, superimposed pneumonia, volume overload He is chronically hypercarbic at baseline and likely lives with CO2 in the 60s-70s.  It is lpossible that his dementia over the past year might be due to worsening CO2 retention.  ABG today shows he is reasonably well compensated.  - Recommend taking him off continuous BiPAP.  Use BiPAP mandatory during the night and as needed during day for work of breathing and for naps - He will need to be discharged with a BiPAP machine.  Discussed with the wife.  He may be more accepting of the BiPAP if we can fit him out with nasal cannula instead of  a full facemask. - No need for frequent ABG unless there is worsening mental status - Continue antibiotics for community-acquired pneumonia and diuresis per primary team. - Code status is DNR.  We will continue to follow.  Chilton Greathouse MD [facility] Pager [phone] If no answer or after 3pm call: [phone] 07/05/2017, 3:00 PM

## 2017-07-05 NOTE — Progress Notes (Signed)
CSW actively listened and provided emotional support. Patient spouse and daughter requested to talk with CSW about SNF options. CSW educated both about SNF options and encouraged them to visit the facilities.   Patient spouse reports patient progress and discharge plan is unknown. Patient is still on Bipap and it is unknown if patient will wean. She reports the patient wants a quality of life and would not want to live on a BiPAP or life support device. She is hopeful the patient continue to progress and get better, possibly transition to rehab or home. The family is receptive to a visit from palliative to discuss patient goals.   CSW will make self available to support and assist with d/c planning.   Vivi BarrackNicole Horice Carrero, Theresia MajorsLCSWA, MSW Clinical Social Worker  [phone] 07/05/2017  6:01 PM

## 2017-07-05 NOTE — Consult Note (Signed)
Consultation Note Date: 07/05/2017   Patient Name: Fernando [name]  DOB: 1945-08-18  MRN: [id number]  Age / Sex: 72 y.o., male  [facility] Referring Physician: Caren Griffins, MD  Reason for Consultation: Establishing goals of care  HPI/Patient Profile: 72 y.o. male  with past medical history of Dm HTN CHF CKD IV dementia OHS/OSA  admitted on 06/30/2017 with altered mental status, confusion, acute hypoxic and hypercarbic resp failure.   Patient has remained in step down unit at [facility], he has intermittently, more often than not, required BIPAP. A palliative consult has been requested for goals of care discussions.   Fernando [name] is a 72 yo gentleman, his wife is his primary historian, she is by his side, I met with them both, I introduced myself and palliative care as follows:  Palliative medicine is specialized medical care for people living with serious illness. It focuses on providing relief from the symptoms and stress of a serious illness. The goal is to improve quality of life for both the patient and the family.  Patient's baseline and condition prior to this hospitalization reviewed. The patient has, for several years, refused sleep study, refused to consider CPAP. He has been sleeping in a recliner for at least a year now, family even got him a sleep number bed, but he was not able to use it, prefers recliner. He has been sleeping all the time, this past year. He has had difficulties with his memory, he has been repeating himself frequently, he has how ever been ambulatory without assistance, up until this hospitalization.   The patient developed confusion and shortness of breath and hence was brought to the ED. The patient and his wife completed a living will years ago, patient has always been clear about his wishes that he would not want to be kept alive by artificial  means.   We reviewed the patient's current condition, his gradual progressive sub acute decline, his CT brain showing atrophy and small vessel disease. Attempted to discuss frankly with patient about BIPAP/CPAP compliance. He is able to listen in and follow along, how ever, he does become confused and is not able to fully participate in his own decision making.   See additional discussions/recommendations below.    HCPOA  wife Has 2 children who also live in [place], [place].   SUMMARY OF RECOMMENDATIONS    DNR DNI re discussed and re confirmed with patient and wife.  Continue current mode of care Patient's wife states that at the time of admission they were told that a lung doctor would come see the patient. Will discuss if a pulm consult would be appropriate with [facility] MD.  OSA/OHS trajectory of illness discussed in detail with her.  PMT to continue to follow, thank you for the consult.    Code Status/Advance Care Planning:  DNR    Symptom Management:    as above   Palliative Prophylaxis:   Bowel Regimen     Psycho-social/Spiritual:   Desire  for further Chaplaincy support:yes  Additional Recommendations: Caregiving  Support/Resources  Prognosis:   Unable to determine  Discharge Planning: To Be Determined      Primary Diagnoses: Present on Admission: . Acute respiratory failure with hypoxia and hypercapnia (HCC) . Morbid obesity ([place]) . HTN (hypertension) . CKD (chronic kidney disease) stage 3, GFR 30-59 ml/min (HCC) . Dementia . Acute encephalopathy . Type 2 diabetes mellitus with renal complication (HCC) . Acute respiratory failure with hypoxia and hypercarbia (HCC) . Hypercapnic respiratory failure ([place])   I have reviewed the medical record, interviewed the patient and family, and examined the patient. The following aspects are pertinent.  Past Medical History:  Diagnosis Date  . Arthritis   . CHF (congestive heart failure) ([place])   . Dementia   .  Diabetes mellitus without complication ([place])   . Hypertension   . Renal disorder    Social History   Socioeconomic History  . Marital status: Married    Spouse name: None  . Number of children: None  . Years of education: None  . Highest education level: None  Social Needs  . Financial resource strain: None  . Food insecurity - worry: None  . Food insecurity - inability: None  . Transportation needs - medical: None  . Transportation needs - non-medical: None  Occupational History  . None  Tobacco Use  . Smoking status: Former Smoker    Last attempt to quit: 05/30/1970    Years since quitting: 47.1  . Smokeless tobacco: Never Used  Substance and Sexual Activity  . Alcohol use: No  . Drug use: No  . Sexual activity: No  Other Topics Concern  . None  Social History Narrative  . None   Family History  Problem Relation Age of Onset  . Heart disease Mother    Scheduled Meds: . allopurinol  100 mg Oral Q breakfast  . aspirin EC  81 mg Oral QODAY  . docusate sodium  100 mg Oral Daily  . docusate sodium  50 mg Oral QHS  . finasteride  5 mg Oral Q breakfast  . insulin aspart  0-15 Units Subcutaneous TID WC  . insulin aspart  0-5 Units Subcutaneous QHS  . metoprolol tartrate  25 mg Oral BID  . terazosin  5 mg Oral Q supper  . torsemide  40 mg Oral Daily   Continuous Infusions: . azithromycin Stopped (07/04/17 1123)  . cefTRIAXone (ROCEPHIN)  IV 1 g (07/05/17 1000)   PRN Meds:.acetaminophen **OR** acetaminophen, hydrALAZINE, labetalol, ondansetron **OR** ondansetron (ZOFRAN) IV Medications Prior to Admission:  Prior to Admission medications   Medication Sig Start Date End Date Taking? Authorizing Provider  allopurinol (ZYLOPRIM) 100 MG tablet Take 100 mg by mouth daily with breakfast.   Yes [provider]  aspirin EC 81 MG tablet Take 81 mg by mouth every other day.   Yes [provider]  cholecalciferol (VITAMIN D) 1000 units tablet Take 1,000 Units  by mouth daily with breakfast.    Yes [provider]  docusate sodium (COLACE) 50 MG capsule Take 50-100 mg by mouth 2 (two) times daily. 2 capsules in am and 1 capsule at night   Yes [provider]  donepezil (ARICEPT) 10 MG tablet Take 10 mg by mouth daily with breakfast.    Yes [provider]  finasteride (PROSCAR) 5 MG tablet Take 5 mg by mouth daily with breakfast.    Yes [provider]  glipiZIDE (GLUCOTROL) 5 MG tablet Take 5  mg by mouth 2 (two) times daily before a meal.   Yes [provider]  magnesium oxide (MAG-OX) 400 MG tablet Take 400 mg by mouth every Monday, Wednesday, and Friday.    Yes [provider]  metoprolol tartrate (LOPRESSOR) 50 MG tablet Take 1 tablet (50 mg total) by mouth 2 (two) times daily. 02/06/17  Yes Lendon Colonel, NP  terazosin (HYTRIN) 5 MG capsule Take 5 mg by mouth daily with supper.    Yes [provider]  torsemide (DEMADEX) 20 MG tablet Take 40 mg (2 Tablet in the AM ) and 20 mg ( 1 Tablet in the PM) 02/09/17  Yes Lendon Colonel, NP  vitamin B-12 (CYANOCOBALAMIN) 1000 MCG tablet Take 1,000 mcg by mouth daily with supper.    Yes [provider]   Allergies  Allergen Reactions  . Ibuprofen Other (See Comments)    Due to one kidney   Review of Systems +confusion  Physical Exam Well developed well nourished gentleman sitting up in bed Is able to feed himself breakfast Off BIPAP, currently on O2 Abdomen distended Diminished breath sounds S1 S2 distant Has edema Awake, is able to state name and answer few yes/no type questions.  Vital Signs: BP (!) 97/40   Pulse 99   Temp 98.9 F (37.2 C) (Axillary)   Resp (!) 27   Ht 6' (1.829 m)   Wt (!) 140.9 kg (310 lb 10.1 oz)   SpO2 92%   BMI 42.13 kg/m  Pain Assessment: No/denies pain   Pain Score: 0-No pain   SpO2: SpO2: 92 % O2 Device:SpO2: 92 % O2 Flow Rate: .O2 Flow Rate (L/min): 4 L/min  IO: Intake/output  summary:   Intake/Output Summary (Last 24 hours) at 07/05/2017 1043 Last data filed at 07/05/2017 1000 Gross per 24 hour  Intake 250 ml  Output 1000 ml  Net -750 ml    LBM: Last BM Date: 06/30/17 Baseline Weight: Weight: (!) 137.9 kg (304 lb) Most recent weight: Weight: (!) 140.9 kg (310 lb 10.1 oz)     Palliative Assessment/Data:   PPS 30%  Time In:  9 Time Out:10.10   Time Total:  70 min  Greater than 50%  of this time was spent counseling and coordinating care related to the above assessment and plan.  Signed by: Loistine Chance, MD  [phone]  Please contact Palliative Medicine Team phone at [phone] for questions and concerns.  For individual provider: See Shea Evans

## 2017-07-05 NOTE — Progress Notes (Signed)
Pt. finished bath was placed on BiPAP per order with RN aware, titrated to I-14/E-7 with Vt's 350cc-550cc, BUR 14/Pt. 26 tolerating well, wife remains at bedside, Fi02 titrated-sats 94%, RT to monitor.

## 2017-07-06 DIAGNOSIS — G309 Alzheimer's disease, unspecified: Secondary | ICD-10-CM

## 2017-07-06 DIAGNOSIS — F0280 Dementia in other diseases classified elsewhere without behavioral disturbance: Secondary | ICD-10-CM

## 2017-07-06 DIAGNOSIS — N183 Chronic kidney disease, stage 3 (moderate): Secondary | ICD-10-CM

## 2017-07-06 LAB — BASIC METABOLIC PANEL
Anion gap: 10 (ref 5–15)
BUN: 33 mg/dL — AB (ref 6–20)
CALCIUM: 8.5 mg/dL — AB (ref 8.9–10.3)
CO2: 37 mmol/L — ABNORMAL HIGH (ref 22–32)
CREATININE: 1.79 mg/dL — AB (ref 0.61–1.24)
Chloride: 94 mmol/L — ABNORMAL LOW (ref 101–111)
GFR calc Af Amer: 42 mL/min — ABNORMAL LOW (ref >60)
GFR, EST NON AFRICAN AMERICAN: 36 mL/min — AB (ref >60)
GLUCOSE: 117 mg/dL — AB (ref 65–99)
Potassium: 3.5 mmol/L (ref 3.5–5.1)
Sodium: 141 mmol/L (ref 135–145)

## 2017-07-06 LAB — GLUCOSE, CAPILLARY
GLUCOSE-CAPILLARY: 161 mg/dL — AB (ref 65–99)
GLUCOSE-CAPILLARY: 152 mg/dL — AB (ref 65–99)
Glucose-Capillary: 111 mg/dL — ABNORMAL HIGH (ref 65–99)
Glucose-Capillary: 138 mg/dL — ABNORMAL HIGH (ref 65–99)

## 2017-07-06 LAB — CBC
HCT: 33.5 % — ABNORMAL LOW (ref 39.0–52.0)
Hemoglobin: 9.3 g/dL — ABNORMAL LOW (ref 13.0–17.0)
MCH: 23.4 pg — ABNORMAL LOW (ref 26.0–34.0)
MCHC: 27.8 g/dL — AB (ref 30.0–36.0)
MCV: 84.4 fL (ref 78.0–100.0)
PLATELETS: 195 10*3/uL (ref 150–400)
RBC: 3.97 MIL/uL — ABNORMAL LOW (ref 4.22–5.81)
RDW: 16.4 % — ABNORMAL HIGH (ref 11.5–15.5)
WBC: 6.5 10*3/uL (ref 4.0–10.5)

## 2017-07-06 NOTE — Consult Note (Addendum)
PULMONARY / CRITICAL CARE MEDICINE   Name: Fernando [name] MRN: [id number] DOB: 21-Nov-1945    ADMISSION DATE:  06/30/2017   CONSULTATION DATE:  07/05/2017  REFERRING MD:  Syliva Overman MD  CHIEF COMPLAINT: Acute on chronic hypercarbic respiratory failure  HISTORY OF PRESENT ILLNESS:   72 year old with history of diabetes, hypertension, CHF, chronic kidney disease, solitary kidney, dementia, OSA/OHS.  Admitted on 2/1 with altered mental status, confusion, acute hypoxic and hypercarbic respiratory failure.  He has been maintained on BiPAP since admission and is unable to tolerate breaks from it.  Patient is DNR and palliative care was involved for goals of care discussion PCCM consulted for help with management.  PAST MEDICAL HISTORY :  He  has a past medical history of Arthritis, CHF (congestive heart failure) (HCC), Dementia, Diabetes mellitus without complication (HCC), Hypertension, and Renal disorder.  PAST SURGICAL HISTORY: He  has a past surgical history that includes Back surgery and Nephrectomy (Left, 1967).  Allergies  Allergen Reactions  . Ibuprofen Other (See Comments)    Due to one kidney    No current facility-administered medications on file prior to encounter.    Current Outpatient Medications on File Prior to Encounter  Medication Sig  . allopurinol (ZYLOPRIM) 100 MG tablet Take 100 mg by mouth daily with breakfast.  . aspirin EC 81 MG tablet Take 81 mg by mouth every other day.  . cholecalciferol (VITAMIN D) 1000 units tablet Take 1,000 Units by mouth daily with breakfast.   . docusate sodium (COLACE) 50 MG capsule Take 50-100 mg by mouth 2 (two) times daily. 2 capsules in am and 1 capsule at night  . donepezil (ARICEPT) 10 MG tablet Take 10 mg by mouth daily with breakfast.   . finasteride (PROSCAR) 5 MG tablet Take 5 mg by mouth daily with breakfast.   . glipiZIDE (GLUCOTROL) 5 MG tablet Take 5 mg by mouth 2 (two) times daily before a meal.  . magnesium oxide  (MAG-OX) 400 MG tablet Take 400 mg by mouth every Monday, Wednesday, and Friday.   . metoprolol tartrate (LOPRESSOR) 50 MG tablet Take 1 tablet (50 mg total) by mouth 2 (two) times daily.  Marland Kitchen terazosin (HYTRIN) 5 MG capsule Take 5 mg by mouth daily with supper.   . torsemide (DEMADEX) 20 MG tablet Take 40 mg (2 Tablet in the AM ) and 20 mg ( 1 Tablet in the PM)  . vitamin B-12 (CYANOCOBALAMIN) 1000 MCG tablet Take 1,000 mcg by mouth daily with supper.    FAMILY HISTORY:  His indicated that his mother is deceased. He indicated that his father is deceased.  SOCIAL HISTORY: He  reports that he quit smoking about 47 years ago. he has never used smokeless tobacco. He reports that he does not drink alcohol or use drugs.  REVIEW OF SYSTEMS:   Feels well denies  SUBJECTIVE:    VITAL SIGNS: BP (!) 150/71   Pulse 89   Temp (!) 97.4 F (36.3 C) (Axillary)   Resp 19   Ht 6' (1.829 m)   Wt (!) 301 lb 9.4 oz (136.8 kg)   SpO2 94%   BMI 40.90 kg/m   HEMODYNAMICS:    VENTILATOR SETTINGS:    INTAKE / OUTPUT: I/O last 3 completed shifts: In: 770 [P.O.:520; IV Piggyback:250] Out: 3750 [Urine:3750]  PHYSICAL EXAMINATION:. Gen:      No acute distress, obese HEENT:  EOMI, sclera anicteric Neck:     No masses; no thyromegaly Lungs:  Clear to auscultation bilaterally; normal respiratory effort CV:         Regular rate and rhythm; no murmurs Abd:      + bowel sounds; soft, non-tender; no palpable masses, no distension Ext:    No edema; adequate peripheral perfusion Skin:      Warm and dry; no rash Neuro: Awake, responsive, no focal deficits  LABS:  BMET Recent Labs  Lab 07/04/17 0753 07/05/17 0257 07/06/17 0320  NA 142 142 141  K 4.0 3.8 3.5  CL 96* 95* 94*  CO2 36* 37* 37*  BUN 35* 37* 33*  CREATININE 2.17* 2.00* 1.79*  GLUCOSE 117* 99 117*    Electrolytes Recent Labs  Lab 07/04/17 0753 07/05/17 0257 07/06/17 0320  CALCIUM 8.5* 8.7* 8.5*    CBC Recent Labs   Lab 07/01/17 0451 07/02/17 0638 07/06/17 0320  WBC 8.6 7.2 6.5  HGB 10.3* 9.2* 9.3*  HCT 37.3* 33.9* 33.5*  PLT 246 217 195    Coag's No results for input(s): APTT, INR in the last 168 hours.  Sepsis Markers No results for input(s): LATICACIDVEN, PROCALCITON, O2SATVEN in the last 168 hours.  ABG Recent Labs  Lab 07/04/17 0555 07/04/17 1348 07/05/17 0439  PHART 7.320* 7.299* 7.343*  PCO2ART 78.3* 81.0* 74.7*  PO2ART 75.9* 78.6* 84.8    Liver Enzymes Recent Labs  Lab 06/30/17 1441 07/01/17 0451 07/02/17 0638  AST 14* 17 11*  ALT 9* 12* 8*  ALKPHOS 49 48 39  BILITOT 0.3 0.6 0.5  ALBUMIN 3.2* 3.2* 2.6*    Cardiac Enzymes Recent Labs  Lab 07/01/17 0012 07/01/17 0453 07/01/17 1004  TROPONINI 0.10* 0.10* 0.09*    Glucose Recent Labs  Lab 07/05/17 0806 07/05/17 1126 07/05/17 1650 07/05/17 2112 07/05/17 2201 07/06/17 0738  GLUCAP 91 127* 175* 136* 145* 111*    Imaging   STUDIES:  Chest x-ray 06/30/17-low lung volumes Chest x-ray 07/02/17-worsening basilar opacities, mild venous congestion  CULTURES: Blood cultures 07/02/17-1/2 coag negative staph  ANTIBIOTICS: Ceftriaxone 2/1>> Azithromycin 2/1 >>  SIGNIFICANT EVENTS:   LINES/TUBES:   DISCUSSION: 72 year old with suspected OSA/OHS.  He never had a sleep study Admitted with hypercarbic respiratory failure.  Likely multifactorial with untreated OSA/OHA, superimposed pneumonia, volume overload. He is chronically hypercarbic at baseline and likely lives with CO2 in the 60s-70s.  ABG shows he is reasonably well compensated. He may have a resonable quality of life if we can get him on a stable regimen of NIPPV.   - Continue using Bipap mandatory at night and as needed and during naps during the day. - He will need to be on NIPPV at night when he leaves the hosptial. Please check with case manager if this can be arranged.  - No need for frequent ABG unless there is worsening mental status. -  Continue antibiotics for community-acquired pneumonia and diuresis per primary team. - Code status is DNR. Agree with palliative care approach - Physical therapy consult. Get OOB.  Chilton GreathousePraveen Kymber Kosar MD [facility] Pager [phone] If no answer or after 3pm call: [phone] 07/06/2017, 10:16 AM

## 2017-07-06 NOTE — Progress Notes (Signed)
Pt took off BIPAP and placed on 4LPM [place].

## 2017-07-06 NOTE — Plan of Care (Signed)
  Elimination: Will not experience complications related to bowel motility 07/06/2017 1856 - Progressing by William Daltonarpenter, Aaleigha Bozza L, RN   Pain Managment: General experience of comfort will improve 07/06/2017 1856 - Progressing by William Daltonarpenter, Junior Huezo L, RN   Safety: Ability to remain free from injury will improve 07/06/2017 1856 - Progressing by William Daltonarpenter, Brylee Berk L, RN

## 2017-07-06 NOTE — Progress Notes (Signed)
PROGRESS NOTE  Fernando [name] ZOX:[id number] DOB: 1945/11/04 DOA: 06/30/2017 PCP: [facility]   LOS: 6 days   Brief Narrative / Interim history: 72 year old morbidly obese male with history of type 2 diabetes, diastolic CHF, hypertension, chronic kidney disease stage III, recently diagnosed with dementia within the past year on Aricept and Namenda, he was brought to the hospital on 06/30/2017 due to worsening mental status, increased hallucinations.  He was also confused and drowsy.  He was brought to the emergency room, he was found to have hypoxic and hypercarbic respiratory failure requiring BiPAP.  Chest x-ray showed evidence of a pneumonia and he was started on antibiotics.  Patient has been having little to no progress over the last 5 days, and does not seem to tolerate coming off of BiPAP.  Palliative care and pulmonary have been consulted.  Assessment & Plan: Active Problems:   HTN (hypertension)   Morbid obesity (HCC)   Acute respiratory failure with hypoxia and hypercapnia (HCC)   CKD (chronic kidney disease) stage 3, GFR 30-59 ml/min (HCC)   Dementia   Acute encephalopathy   Type 2 diabetes mellitus with renal complication (HCC)   Acute respiratory failure with hypoxia and hypercarbia (HCC)   Hypercapnic respiratory failure (HCC)   Acute on chronic respiratory failure with hypoxia and hypercarbia /community-acquired pneumonia -Suspect obesity hypoventilation as well as obstructive sleep apnea -Outpatient MD wanted to evaluate patient for OSA, however patient has expressed his wishes that he would not want to be on a mask when he sleeps.  Wife thinks now that he would reconsider -Overall very poor prognosis -Did well off BiPAP yesterday afternoon, will attempt again today to wean off and keep on nasal cannula during the daytime -Appreciate palliative care as well as pulmonology input -Continue antibiotics -He is growing 1 out of 2 bottles coagulase-negative staph  which is likely contaminant  Type 2 diabetes mellitus -Continue sliding scale, CBG is reasonable however he has poor p.o. intake due to the persistent BiPAP   Chronic kidney disease stage IV -Baseline creatinine 2-2.2, currently slightly better than baseline at 1.7, continue to monitor  Acute on chronic diastolic CHF -Most recent 2D echo was done in June 2018 showed an EF of 55-60% -Chest x-ray few days ago slight vascular congestion, he is on torsemide, hypotensive at times it is limiting aggressive diuresis -Fluid status appears fair, net -5.2 L today  Dementia -Progressive over the last year, continue home medications  Hypertension -Metoprolol was decreased on 2/6, blood pressure looks a little bit better this morning range between 110 and 150s.  Continue current regimen  Metabolic encephalopathy secondary to hypercarbia -Improved, even with the BiPAP on patient is alert and able to answer questions appropriately.  Has underlying dementia and is not oriented to time but that is normal for him  Morbid obesity   DVT prophylaxis: SCDs Code Status: DNR Family Communication: wife at bedside Disposition Plan: TBD  Consultants:   Palliative care  Pulmonary   Procedures:   BiPAP  Antimicrobials:  Ceftriaxone 2/3 >>  Azithromycin 2/3 >>  Subjective: -More alert and interactive today, BiPAP is still on.  Denies any pain, denies any shortness of breath, no abdominal discomfort nausea or vomiting.  Objective: Vitals:   07/06/17 0800 07/06/17 0907 07/06/17 1000 07/06/17 1200  BP: (!) 150/71 (!) 150/71 (!) 110/58 116/65  Pulse: 70 89 (!) 54 93  Resp: 19  (!) 24 14  Temp: (!) 97.4 F (36.3 C)  TempSrc: Axillary     SpO2: 94%  96% 93%  Weight:      Height:        Intake/Output Summary (Last 24 hours) at 07/06/2017 1400 Last data filed at 07/06/2017 1052 Gross per 24 hour  Intake 170 ml  Output 3850 ml  Net -3680 ml   Filed Weights   07/04/17 0307 07/05/17 0556  07/06/17 0312  Weight: (!) 140.9 kg (310 lb 10.1 oz) (!) 140.9 kg (310 lb 10.1 oz) (!) 136.8 kg (301 lb 9.4 oz)    Examination:  Constitutional: No apparent distress, more alert, BiPAP still on Eyes: No scleral icterus ENMT: Moist mucous membranes Respiratory: Overall diminished breath sounds, no wheezing or crackles.  BiPAP present. Cardiovascular: Regular rate and rhythm without murmurs or gallops.  No edema.  Good pulses Abdomen: Obese, soft, nontender Skin: No rashes seen Neurologic: No focal findings, follows commands better today Psychiatric: Alert to place and person but not to time   Data Reviewed: I have independently reviewed following labs and imaging studies   CBC: Recent Labs  Lab 06/30/17 1441 07/01/17 0451 07/02/17 0638 07/06/17 0320  WBC 8.2 8.6 7.2 6.5  HGB 10.2* 10.3* 9.2* 9.3*  HCT 35.8* 37.3* 33.9* 33.5*  MCV 84.2 86.7 87.1 84.4  PLT 228 246 217 195   Basic Metabolic Panel: Recent Labs  Lab 07/02/17 0638 07/03/17 0500 07/04/17 0753 07/05/17 0257 07/06/17 0320  NA 142 145 142 142 141  K 3.7 4.1 4.0 3.8 3.5  CL 99* 102 96* 95* 94*  CO2 36* 31 36* 37* 37*  GLUCOSE 104* 103* 117* 99 117*  BUN 32* 35* 35* 37* 33*  CREATININE 2.41* 2.20* 2.17* 2.00* 1.79*  CALCIUM 8.0* 8.4* 8.5* 8.7* 8.5*   GFR: Estimated Creatinine Clearance: 53.4 mL/min (A) (by C-G formula based on SCr of 1.79 mg/dL (H)). Liver Function Tests: Recent Labs  Lab 06/30/17 1441 07/01/17 0451 07/02/17 0638  AST 14* 17 11*  ALT 9* 12* 8*  ALKPHOS 49 48 39  BILITOT 0.3 0.6 0.5  PROT 7.0 7.2 5.8*  ALBUMIN 3.2* 3.2* 2.6*   No results for input(s): LIPASE, AMYLASE in the last 168 hours. Recent Labs  Lab 07/01/17 0012  AMMONIA 29   Coagulation Profile: No results for input(s): INR, PROTIME in the last 168 hours. Cardiac Enzymes: Recent Labs  Lab 07/01/17 0012 07/01/17 0453 07/01/17 1004  TROPONINI 0.10* 0.10* 0.09*   BNP (last 3 results) No results for input(s):  PROBNP in the last 8760 hours. HbA1C: No results for input(s): HGBA1C in the last 72 hours. CBG: Recent Labs  Lab 07/05/17 1650 07/05/17 2112 07/05/17 2201 07/06/17 0738 07/06/17 1225  GLUCAP 175* 136* 145* 111* 161*   Lipid Profile: No results for input(s): CHOL, HDL, LDLCALC, TRIG, CHOLHDL, LDLDIRECT in the last 72 hours. Thyroid Function Tests: No results for input(s): TSH, T4TOTAL, FREET4, T3FREE, THYROIDAB in the last 72 hours. Anemia Panel: No results for input(s): VITAMINB12, FOLATE, FERRITIN, TIBC, IRON, RETICCTPCT in the last 72 hours. Urine analysis:    Component Value Date/Time   COLORURINE YELLOW 07/01/2017 0012   APPEARANCEUR CLEAR 07/01/2017 0012   LABSPEC 1.010 07/01/2017 0012   PHURINE 5.0 07/01/2017 0012   GLUCOSEU 50 (A) 07/01/2017 0012   HGBUR NEGATIVE 07/01/2017 0012   BILIRUBINUR NEGATIVE 07/01/2017 0012   KETONESUR NEGATIVE 07/01/2017 0012   PROTEINUR 100 (A) 07/01/2017 0012   UROBILINOGEN 0.2 07/15/2009 2249   NITRITE NEGATIVE 07/01/2017 0012   LEUKOCYTESUR NEGATIVE 07/01/2017  0012   Sepsis Labs: Invalid input(s): PROCALCITONIN, LACTICIDVEN  Recent Results (from the past 240 hour(s))  Respiratory Panel by PCR     Status: None   Collection Time: 07/02/17 10:01 AM  Result Value Ref Range Status   Adenovirus NOT DETECTED NOT DETECTED Final   Coronavirus 229E NOT DETECTED NOT DETECTED Final   Coronavirus HKU1 NOT DETECTED NOT DETECTED Final   Coronavirus NL63 NOT DETECTED NOT DETECTED Final   Coronavirus OC43 NOT DETECTED NOT DETECTED Final   Metapneumovirus NOT DETECTED NOT DETECTED Final   Rhinovirus / Enterovirus NOT DETECTED NOT DETECTED Final   Influenza A NOT DETECTED NOT DETECTED Final   Influenza B NOT DETECTED NOT DETECTED Final   Parainfluenza Virus 1 NOT DETECTED NOT DETECTED Final   Parainfluenza Virus 2 NOT DETECTED NOT DETECTED Final   Parainfluenza Virus 3 NOT DETECTED NOT DETECTED Final   Parainfluenza Virus 4 NOT DETECTED NOT  DETECTED Final   Respiratory Syncytial Virus NOT DETECTED NOT DETECTED Final   Bordetella pertussis NOT DETECTED NOT DETECTED Final   Chlamydophila pneumoniae NOT DETECTED NOT DETECTED Final   Mycoplasma pneumoniae NOT DETECTED NOT DETECTED Final    Comment: Performed at [facility], [address] [place], [place] [address]  Culture, blood (routine x 2) Call MD if unable to obtain prior to antibiotics being given     Status: None (Preliminary result)   Collection Time: 07/02/17 10:25 AM  Result Value Ref Range Status   Specimen Description BLOOD RIGHT ARM  Final   Special Requests IN PEDIATRIC BOTTLE Blood Culture adequate volume  Final   Culture   Final    NO GROWTH 3 DAYS Performed at [facility], [address] [place], [place] [address]    Report Status PENDING  Incomplete  Culture, blood (routine x 2) Call MD if unable to obtain prior to antibiotics being given     Status: Abnormal   Collection Time: 07/02/17 10:40 AM  Result Value Ref Range Status   Specimen Description BLOOD RIGHT FOREARM  Final   Special Requests IN PEDIATRIC BOTTLE Blood Culture adequate volume  Final   Culture  Setup Time   Final    GRAM POSITIVE COCCI IN CLUSTERS IN PEDIATRIC BOTTLE CRITICAL RESULT CALLED TO, READ BACK BY AND VERIFIED WITH: PHARMD D ZEIGLER [id number] MLM    Culture (A)  Final    STAPHYLOCOCCUS SPECIES (COAGULASE NEGATIVE) THE SIGNIFICANCE OF ISOLATING THIS ORGANISM FROM A SINGLE SET OF BLOOD CULTURES WHEN MULTIPLE SETS ARE DRAWN IS UNCERTAIN. PLEASE NOTIFY THE MICROBIOLOGY DEPARTMENT WITHIN ONE WEEK IF SPECIATION AND SENSITIVITIES ARE REQUIRED. Performed at [facility], [address] [place], [place] [address]    Report Status 07/04/2017 FINAL  Final  Blood Culture ID Panel (Reflexed)     Status: Abnormal   Collection Time: 07/02/17 10:40 AM  Result Value Ref Range Status   Enterococcus species NOT DETECTED NOT DETECTED Final   Listeria monocytogenes NOT  DETECTED NOT DETECTED Final   Staphylococcus species DETECTED (A) NOT DETECTED Final    Comment: Methicillin (oxacillin) susceptible coagulase negative staphylococcus. Possible blood culture contaminant (unless isolated from more than one blood culture draw or clinical case suggests pathogenicity). No antibiotic treatment is indicated for blood  culture contaminants. CRITICAL RESULT CALLED TO, READ BACK BY AND VERIFIED WITH: PHARMD D ZEIGLER [id number] MLM    Staphylococcus aureus NOT DETECTED NOT DETECTED Final   Methicillin resistance NOT DETECTED NOT DETECTED Final  Streptococcus species NOT DETECTED NOT DETECTED Final   Streptococcus agalactiae NOT DETECTED NOT DETECTED Final   Streptococcus pneumoniae NOT DETECTED NOT DETECTED Final   Streptococcus pyogenes NOT DETECTED NOT DETECTED Final   Acinetobacter baumannii NOT DETECTED NOT DETECTED Final   Enterobacteriaceae species NOT DETECTED NOT DETECTED Final   Enterobacter cloacae complex NOT DETECTED NOT DETECTED Final   Escherichia coli NOT DETECTED NOT DETECTED Final   Klebsiella oxytoca NOT DETECTED NOT DETECTED Final   Klebsiella pneumoniae NOT DETECTED NOT DETECTED Final   Proteus species NOT DETECTED NOT DETECTED Final   Serratia marcescens NOT DETECTED NOT DETECTED Final   Haemophilus influenzae NOT DETECTED NOT DETECTED Final   Neisseria meningitidis NOT DETECTED NOT DETECTED Final   Pseudomonas aeruginosa NOT DETECTED NOT DETECTED Final   Candida albicans NOT DETECTED NOT DETECTED Final   Candida glabrata NOT DETECTED NOT DETECTED Final   Candida krusei NOT DETECTED NOT DETECTED Final   Candida parapsilosis NOT DETECTED NOT DETECTED Final   Candida tropicalis NOT DETECTED NOT DETECTED Final    Comment: Performed at [facility], [address], [place], [place] [address]  MRSA PCR Screening     Status: None   Collection Time: 07/02/17  4:25 PM  Result Value Ref Range Status   MRSA by PCR NEGATIVE NEGATIVE Final     Comment:        The GeneXpert MRSA Assay (FDA approved for NASAL specimens only), is one component of a comprehensive MRSA colonization surveillance program. It is not intended to diagnose MRSA infection nor to guide or monitor treatment for MRSA infections. Performed at [facility], [address], [place], [place] [address]       Radiology Studies: No results found.   Scheduled Meds: . allopurinol  100 mg Oral Q breakfast  . aspirin EC  81 mg Oral QODAY  . docusate sodium  100 mg Oral Daily  . docusate sodium  50 mg Oral QHS  . finasteride  5 mg Oral Q breakfast  . insulin aspart  0-15 Units Subcutaneous TID WC  . insulin aspart  0-5 Units Subcutaneous QHS  . metoprolol tartrate  12.5 mg Oral BID  . terazosin  5 mg Oral Q supper  . torsemide  40 mg Oral Daily   Continuous Infusions: . azithromycin Stopped (07/06/17 1155)  . cefTRIAXone (ROCEPHIN)  IV Stopped (07/06/17 0981)     Pamella Pert, MD, PhD [facility] Pager [phone]  If 7PM-7AM, please contact night-coverage [email or web address] Password [email or web address] 07/06/2017, 2:00 PM

## 2017-07-07 LAB — BASIC METABOLIC PANEL
Anion gap: 8 (ref 5–15)
BUN: 30 mg/dL — AB (ref 6–20)
CALCIUM: 8.6 mg/dL — AB (ref 8.9–10.3)
CO2: 39 mmol/L — AB (ref 22–32)
CREATININE: 1.63 mg/dL — AB (ref 0.61–1.24)
Chloride: 94 mmol/L — ABNORMAL LOW (ref 101–111)
GFR calc Af Amer: 47 mL/min — ABNORMAL LOW (ref >60)
GFR, EST NON AFRICAN AMERICAN: 40 mL/min — AB (ref >60)
GLUCOSE: 112 mg/dL — AB (ref 65–99)
Potassium: 3.5 mmol/L (ref 3.5–5.1)
Sodium: 141 mmol/L (ref 135–145)

## 2017-07-07 LAB — GLUCOSE, CAPILLARY
GLUCOSE-CAPILLARY: 110 mg/dL — AB (ref 65–99)
Glucose-Capillary: 196 mg/dL — ABNORMAL HIGH (ref 65–99)
Glucose-Capillary: 129 mg/dL — ABNORMAL HIGH (ref 65–99)
Glucose-Capillary: 117 mg/dL — ABNORMAL HIGH (ref 65–99)

## 2017-07-07 LAB — CBC
HCT: 32.4 % — ABNORMAL LOW (ref 39.0–52.0)
HEMOGLOBIN: 9.2 g/dL — AB (ref 13.0–17.0)
MCH: 24 pg — AB (ref 26.0–34.0)
MCHC: 28.4 g/dL — AB (ref 30.0–36.0)
MCV: 84.6 fL (ref 78.0–100.0)
Platelets: 189 10*3/uL (ref 150–400)
RBC: 3.83 MIL/uL — ABNORMAL LOW (ref 4.22–5.81)
RDW: 16.4 % — AB (ref 11.5–15.5)
WBC: 5.6 10*3/uL (ref 4.0–10.5)

## 2017-07-07 LAB — CULTURE, BLOOD (ROUTINE X 2)
Culture: NO GROWTH
SPECIAL REQUESTS: ADEQUATE

## 2017-07-07 NOTE — Progress Notes (Signed)
PULMONARY / CRITICAL CARE MEDICINE   Name: Fernando GeninRichard A [name] MRN: [id number] DOB: April 13, 1946    ADMISSION DATE:  06/30/2017   CONSULTATION DATE:  07/05/2017  REFERRING MD:  Syliva OvermanGherge, Costin MD  CHIEF COMPLAINT: Acute on chronic hypercarbic respiratory failure  HISTORY OF PRESENT ILLNESS:   72 year old with history of diabetes, hypertension, CHF, chronic kidney disease, solitary kidney, dementia, OSA/OHS.  Admitted on 2/1 with altered mental status, confusion, acute hypoxic and hypercarbic respiratory failure.  He has been maintained on BiPAP since admission and is unable to tolerate breaks from it.  Patient is DNR and palliative care was involved for goals of care discussion PCCM consulted for help with management.  PAST MEDICAL HISTORY :  He  has a past medical history of Arthritis, CHF (congestive heart failure) (HCC), Dementia, Diabetes mellitus without complication (HCC), Hypertension, and Renal disorder.  PAST SURGICAL HISTORY: He  has a past surgical history that includes Back surgery and Nephrectomy (Left, 1967).  Allergies  Allergen Reactions  . Ibuprofen Other (See Comments)    Due to one kidney    No current facility-administered medications on file prior to encounter.    Current Outpatient Medications on File Prior to Encounter  Medication Sig  . allopurinol (ZYLOPRIM) 100 MG tablet Take 100 mg by mouth daily with breakfast.  . aspirin EC 81 MG tablet Take 81 mg by mouth every other day.  . cholecalciferol (VITAMIN D) 1000 units tablet Take 1,000 Units by mouth daily with breakfast.   . docusate sodium (COLACE) 50 MG capsule Take 50-100 mg by mouth 2 (two) times daily. 2 capsules in am and 1 capsule at night  . donepezil (ARICEPT) 10 MG tablet Take 10 mg by mouth daily with breakfast.   . finasteride (PROSCAR) 5 MG tablet Take 5 mg by mouth daily with breakfast.   . glipiZIDE (GLUCOTROL) 5 MG tablet Take 5 mg by mouth 2 (two) times daily before a meal.  . magnesium oxide  (MAG-OX) 400 MG tablet Take 400 mg by mouth every Monday, Wednesday, and Friday.   . metoprolol tartrate (LOPRESSOR) 50 MG tablet Take 1 tablet (50 mg total) by mouth 2 (two) times daily.  Marland Kitchen. terazosin (HYTRIN) 5 MG capsule Take 5 mg by mouth daily with supper.   . torsemide (DEMADEX) 20 MG tablet Take 40 mg (2 Tablet in the AM ) and 20 mg ( 1 Tablet in the PM)  . vitamin B-12 (CYANOCOBALAMIN) 1000 MCG tablet Take 1,000 mcg by mouth daily with supper.    FAMILY HISTORY:  His indicated that his mother is deceased. He indicated that his father is deceased.  SOCIAL HISTORY: He  reports that he quit smoking about 47 years ago. he has never used smokeless tobacco. He reports that he does not drink alcohol or use drugs.  REVIEW OF SYSTEMS:     SUBJECTIVE:  On bipap last night. Sitting up in chair. No complaints  VITAL SIGNS: BP 138/60   Pulse 79   Temp 98.2 F (36.8 C) (Axillary)   Resp (!) 24   Ht 6' (1.829 m)   Wt 299 lb 13.2 oz (136 kg)   SpO2 95%   BMI 40.66 kg/m   HEMODYNAMICS:    VENTILATOR SETTINGS:    INTAKE / OUTPUT: I/O last 3 completed shifts: In: 50 [IV Piggyback:50] Out: 3400 [Urine:3400]  PHYSICAL EXAMINATION:. Gen:      No acute distress, obese HEENT:  EOMI, sclera anicteric Neck:     No masses; no  thyromegaly Lungs:    Clear to auscultation bilaterally; normal respiratory effort CV:         Regular rate and rhythm; no murmurs Abd:      + bowel sounds; soft, non-tender; no palpable masses, no distension Ext:    No edema; adequate peripheral perfusion Skin:      Warm and dry; no rash Neuro: alert and oriented x 3 Psych: normal mood and affect  LABS:  BMET Recent Labs  Lab 07/05/17 0257 07/06/17 0320 07/07/17 0252  NA 142 141 141  K 3.8 3.5 3.5  CL 95* 94* 94*  CO2 37* 37* 39*  BUN 37* 33* 30*  CREATININE 2.00* 1.79* 1.63*  GLUCOSE 99 117* 112*    Electrolytes Recent Labs  Lab 07/05/17 0257 07/06/17 0320 07/07/17 0252  CALCIUM 8.7*  8.5* 8.6*    CBC Recent Labs  Lab 07/02/17 0638 07/06/17 0320 07/07/17 0252  WBC 7.2 6.5 5.6  HGB 9.2* 9.3* 9.2*  HCT 33.9* 33.5* 32.4*  PLT 217 195 189    Coag's No results for input(s): APTT, INR in the last 168 hours.  Sepsis Markers No results for input(s): LATICACIDVEN, PROCALCITON, O2SATVEN in the last 168 hours.  ABG Recent Labs  Lab 07/04/17 0555 07/04/17 1348 07/05/17 0439  PHART 7.320* 7.299* 7.343*  PCO2ART 78.3* 81.0* 74.7*  PO2ART 75.9* 78.6* 84.8    Liver Enzymes Recent Labs  Lab 06/30/17 1441 07/01/17 0451 07/02/17 0638  AST 14* 17 11*  ALT 9* 12* 8*  ALKPHOS 49 48 39  BILITOT 0.3 0.6 0.5  ALBUMIN 3.2* 3.2* 2.6*    Cardiac Enzymes Recent Labs  Lab 07/01/17 0012 07/01/17 0453 07/01/17 1004  TROPONINI 0.10* 0.10* 0.09*    Glucose Recent Labs  Lab 07/05/17 2201 07/06/17 0738 07/06/17 1225 07/06/17 1553 07/06/17 2041 07/07/17 0759  GLUCAP 145* 111* 161* 138* 152* 110*    Imaging   STUDIES:  Chest x-ray 06/30/17-low lung volumes Chest x-ray 07/02/17-worsening basilar opacities, mild venous congestion  CULTURES: Blood cultures 07/02/17-1/2 coag negative staph  ANTIBIOTICS: Ceftriaxone 2/1>> Azithromycin 2/1 >>  SIGNIFICANT EVENTS:   LINES/TUBES:   DISCUSSION: 72 year old with suspected OSA/OHS.  He never had a sleep study Admitted with hypercarbic respiratory failure.  Likely multifactorial with untreated OSA/OHA, superimposed pneumonia, volume overload. He is chronically hypercarbic at baseline and likely lives with CO2 in the 60s-70s.  ABG shows he is reasonably well compensated. He may have a resonable quality of life if we can get him on a stable regimen of NIPPV.   - Continue Bipap at night. Can use Bipap during the day for naps.  - He will need to be on NIPPV at night when he leaves the hosptial.  - No need for frequent ABG unless there is worsening mental status. - Continue antibiotics for community-acquired  pneumonia and diuresis per primary team. - Code status is DNR. Agree with palliative care approach - Physical therapy consult. Get OOB.  We will be available as needed during this admission. Please call with questions.   Chilton Greathouse MD [facility] Pager [phone] If no answer or after 3pm call: [phone] 07/07/2017, 11:19 AM

## 2017-07-07 NOTE — Progress Notes (Signed)
Daily Progress Note   Patient Name: Fernando [name]       Date: 07/07/2017 DOB: Feb 04, 1946  Age: 72 y.o. MRN#: [id number] Attending Physician: Leatha Gilding, MD Primary Care Physician: [facility] Admit Date: 06/30/2017  Reason for Consultation/Follow-up: Establishing goals of care  Subjective:  much more awake and alert than the time of my initial consultation Wife at bedside Tolerating POs Off BIPAP currently  Seen by [facility] medicine, appreciate their recommendations.  PT eval pending See below:   Length of Stay: 7  Current Medications: Scheduled Meds:  . allopurinol  100 mg Oral Q breakfast  . aspirin EC  81 mg Oral QODAY  . docusate sodium  100 mg Oral Daily  . docusate sodium  50 mg Oral QHS  . finasteride  5 mg Oral Q breakfast  . insulin aspart  0-15 Units Subcutaneous TID WC  . insulin aspart  0-5 Units Subcutaneous QHS  . metoprolol tartrate  12.5 mg Oral BID  . terazosin  5 mg Oral Q supper  . torsemide  40 mg Oral Daily    Continuous Infusions: . azithromycin Stopped (07/06/17 1155)  . cefTRIAXone (ROCEPHIN)  IV Stopped (07/06/17 0956)    PRN Meds: acetaminophen **OR** acetaminophen, hydrALAZINE, labetalol, ondansetron **OR** ondansetron (ZOFRAN) IV  Physical Exam         Awake alert On 4L O2 [place], was not on home )2 S1 S2 +bowel sounds abdomen obese Clear anteriorly breath sounds Has some edema Bruise on bridge of nose.  Non focal Hard of hearing, has hearing aids at home.  Vital Signs: BP 138/60   Pulse 79   Temp 98.2 F (36.8 C) (Axillary)   Resp (!) 24   Ht 6' (1.829 m)   Wt 136 kg (299 lb 13.2 oz)   SpO2 95%   BMI 40.66 kg/m  SpO2: SpO2: 95 % O2 Device: O2 Device: Nasal Cannula O2 Flow Rate: O2 Flow Rate (L/min): 4  L/min  Intake/output summary:   Intake/Output Summary (Last 24 hours) at 07/07/2017 1012 Last data filed at 07/07/2017 0526 Gross per 24 hour  Intake -  Output 3000 ml  Net -3000 ml   LBM: Last BM Date: 07/06/17 Baseline Weight: Weight: (!) 137.9 kg (304 lb) Most recent weight: Weight: 136 kg (299 lb 13.2 oz)  Palliative Assessment/Data: PPS 40%     Patient Active Problem List   Diagnosis Date Noted  . Hypercapnic respiratory failure (HCC) 07/01/2017  . Acute respiratory failure with hypoxia and hypercapnia (HCC) 06/30/2017  . CKD (chronic kidney disease) stage 3, GFR 30-59 ml/min (HCC) 06/30/2017  . Dementia 06/30/2017  . Acute encephalopathy 06/30/2017  . Type 2 diabetes mellitus with renal complication (HCC) 06/30/2017  . Acute respiratory failure with hypoxia and hypercarbia (HCC) 06/30/2017  . Acute respiratory failure with hypoxia (HCC) 01/20/2017  . Acute on chronic diastolic CHF (congestive heart failure) (HCC)   . SVT (supraventricular tachycardia) (HCC)   . Elevated troponin   . Morbid obesity (HCC) 11/09/2016  . SOB (shortness of breath) 11/09/2016  . AKI (acute kidney injury) (HCC) 01/29/2016  . Single kidney 01/29/2016  . Renal insufficiency 01/29/2016  . HTN (hypertension) 01/29/2016  . Diabetes (HCC) 01/29/2016  . CHF (congestive heart failure) (HCC) 01/29/2016  . Hypoxia 01/29/2016  . Tachycardia 01/29/2016  . Hypotension 01/29/2016    Palliative Care Assessment & Plan   Patient Profile:    Assessment:  72 yo gentleman with DM HTN CKD III solitary kidney, OSA/OHS admitted with worsening confusion, acute on chronic hypoxic and hyper capneic resp failure. Intermittently requiring BIPAP Has been non compliant with sleep study, did not want to wear CPAP at home for years Apparently recently also diagnosed with dementia.  Generalized deconditioning at home for past few months. Was not on home O2, currently on 4L [place]  Recommendations/Plan:    Will need home O2 and home CPAP arranged at the time of discharge PT eval pending, recommend SNF rehab with palliative on discharge if appropriate.  Discussed with patient about importance of compliance with CPAP as outpatient, encouraged him to follow up regularly with lung doctors as outpatient.  PMT will follow peripherally, unless the patient has a sudden acute decompensation during this hospitalization that might necessitate re discussion of GOC.   Code Status:    Code Status Orders  (From admission, onward)        Start     Ordered   06/30/17 2150  Do not attempt resuscitation (DNR)  Continuous    Question Answer Comment  In the event of cardiac or respiratory ARREST Do not call a "code blue"   In the event of cardiac or respiratory ARREST Do not perform Intubation, CPR, defibrillation or ACLS   In the event of cardiac or respiratory ARREST Use medication by any route, position, wound care, and other measures to relive pain and suffering. May use oxygen, suction and manual treatment of airway obstruction as needed for comfort.      06/30/17 2151    Code Status History    Date Active Date Inactive Code Status Order ID Comments User Context   01/20/2017 15:44 01/26/2017 15:58 Full Code [id number]  Floydene Flock, MD ED   11/18/2016 17:13 11/20/2016 15:26 Full Code [id number]  Clydia Llano, MD Inpatient   11/09/2016 13:54 11/15/2016 16:54 Full Code [id number]  Osvaldo Shipper, MD Inpatient   01/30/2016 01:19 02/01/2016 17:37 Full Code [id number]  Michael Litter, MD Inpatient    Advance Directive Documentation     Most Recent Value  Type of Advance Directive  Healthcare Power of Attorney, Living will  Pre-existing out of facility DNR order (yellow form or pink MOST form)  No data  "MOST" Form in Place?  No data       Prognosis:   Unable to determine  Discharge Planning:  Skilled Nursing Facility for rehab with Palliative care service follow-up  Care plan was discussed with     Thank you for allowing the Palliative Medicine Team to assist in the care of this patient.   Time In:  9.30 Time Out: 10.05 Total Time 35 Prolonged Time Billed  no       Greater than 50%  of this time was spent counseling and coordinating care related to the above assessment and plan.  Rosalin HawkingZeba Tavon Magnussen, MD [phone]  Please contact Palliative Medicine Team phone at [phone] for questions and concerns.

## 2017-07-07 NOTE — Progress Notes (Signed)
PROGRESS NOTE  Fernando [name] ZOX:[id number] DOB: 12-01-1945 DOA: 06/30/2017 PCP: [facility]   LOS: 7 days   Brief Narrative / Interim history: 72 year old morbidly obese male with history of type 2 diabetes, diastolic CHF, hypertension, chronic kidney disease stage III, recently diagnosed with dementia within the past year on Aricept and Namenda, he was brought to the hospital on 06/30/2017 due to worsening mental status, increased hallucinations.  He was also confused and drowsy.  He was brought to the emergency room, he was found to have hypoxic and hypercarbic respiratory failure requiring BiPAP.  Chest x-ray showed evidence of a pneumonia and he was started on antibiotics.  Patient has been having little to no progress over the last 5 days, and does not seem to tolerate coming off of BiPAP.  Palliative care and pulmonary have been consulted.  Assessment & Plan: Active Problems:   HTN (hypertension)   Morbid obesity (HCC)   Acute respiratory failure with hypoxia and hypercapnia (HCC)   CKD (chronic kidney disease) stage 3, GFR 30-59 ml/min (HCC)   Dementia   Acute encephalopathy   Type 2 diabetes mellitus with renal complication (HCC)   Acute respiratory failure with hypoxia and hypercarbia (HCC)   Hypercapnic respiratory failure (HCC)   Acute on chronic respiratory failure with hypoxia and hypercarbia /community-acquired pneumonia -Suspect obesity hypoventilation as well as obstructive sleep apnea -Outpatient MD wanted to evaluate patient for OSA, however patient has expressed his wishes that he would not want to be on a mask when he sleeps.  Wife thinks now that he would reconsider -Overall very poor prognosis -He is generally doing well during the day without BiPAP and uses  at nighttime only, will transfer to floor tomorrow if he has a good day today -Appreciate palliative care as well as pulmonology input -Continue antibiotics, he is on ceftriaxone and  azithromycin, today's day 6, plan for 7 days -He is growing 1 out of 2 bottles coagulase-negative staph which is likely contaminant  Type 2 diabetes mellitus -Continue sliding scale, CBG is reasonable 1/10 190  Chronic kidney disease stage IV -Baseline creatinine 2-2.2 -Creatinine this morning 1.6  Acute on chronic diastolic CHF -Most recent 2D echo was done in June 2018 showed an EF of 55-60% -Fluid status appears fair, net -8.1, continue torsemide  Dementia -Progressive over the last year, continue home medications  Hypertension -Metoprolol was decreased on 2/6, blood pressure this morning 138/89, continue current regimen   Metabolic encephalopathy secondary to hypercarbia -Improved, even with the BiPAP on patient is alert and able to answer questions appropriately.  Has underlying dementia and is not oriented to time but that is normal for him  Morbid obesity   DVT prophylaxis: SCDs Code Status: DNR Family Communication: wife at bedside Disposition Plan: TBD  Consultants:   Palliative care  Pulmonary   Procedures:   BiPAP  Antimicrobials:  Ceftriaxone 2/3 >>  Azithromycin 2/3 >>  Subjective: -On nasal cannula, feeling well, denies any shortness of breath, denies any chest pain, abdominal pain nausea or vomiting.  Objective: Vitals:   07/07/17 0600 07/07/17 0720 07/07/17 0800 07/07/17 1210  BP: 132/89  138/60   Pulse: 73  79   Resp: (!) 23  (!) 24   Temp:  98.2 F (36.8 C)  98.4 F (36.9 C)  TempSrc:  Axillary  Oral  SpO2: 96%  95%   Weight:      Height:        Intake/Output Summary (Last 24  hours) at 07/07/2017 1345 Last data filed at 07/07/2017 0526 Gross per 24 hour  Intake -  Output 2000 ml  Net -2000 ml   Filed Weights   07/05/17 0556 07/06/17 0312 07/07/17 0500  Weight: (!) 140.9 kg (310 lb 10.1 oz) (!) 136.8 kg (301 lb 9.4 oz) 136 kg (299 lb 13.2 oz)    Examination:  Constitutional: NAD Eyes: lids and conjunctivae normal  ENMT:  mmm Respiratory: overall decreased breath sounds, no wheezing, no crackles  Cardiovascular: RRR without murmurs, no edema  Abdomen: soft, non tender, non distended, BS + Skin: No rashes Neurologic: Nonfocal   Data Reviewed: I have independently reviewed following labs and imaging studies   CBC: Recent Labs  Lab 06/30/17 1441 07/01/17 0451 07/02/17 0638 07/06/17 0320 07/07/17 0252  WBC 8.2 8.6 7.2 6.5 5.6  HGB 10.2* 10.3* 9.2* 9.3* 9.2*  HCT 35.8* 37.3* 33.9* 33.5* 32.4*  MCV 84.2 86.7 87.1 84.4 84.6  PLT 228 246 217 195 189   Basic Metabolic Panel: Recent Labs  Lab 07/03/17 0500 07/04/17 0753 07/05/17 0257 07/06/17 0320 07/07/17 0252  NA 145 142 142 141 141  K 4.1 4.0 3.8 3.5 3.5  CL 102 96* 95* 94* 94*  CO2 31 36* 37* 37* 39*  GLUCOSE 103* 117* 99 117* 112*  BUN 35* 35* 37* 33* 30*  CREATININE 2.20* 2.17* 2.00* 1.79* 1.63*  CALCIUM 8.4* 8.5* 8.7* 8.5* 8.6*   GFR: Estimated Creatinine Clearance: 58.5 mL/min (A) (by C-G formula based on SCr of 1.63 mg/dL (H)). Liver Function Tests: Recent Labs  Lab 06/30/17 1441 07/01/17 0451 07/02/17 0638  AST 14* 17 11*  ALT 9* 12* 8*  ALKPHOS 49 48 39  BILITOT 0.3 0.6 0.5  PROT 7.0 7.2 5.8*  ALBUMIN 3.2* 3.2* 2.6*   No results for input(s): LIPASE, AMYLASE in the last 168 hours. Recent Labs  Lab 07/01/17 0012  AMMONIA 29   Coagulation Profile: No results for input(s): INR, PROTIME in the last 168 hours. Cardiac Enzymes: Recent Labs  Lab 07/01/17 0012 07/01/17 0453 07/01/17 1004  TROPONINI 0.10* 0.10* 0.09*   BNP (last 3 results) No results for input(s): PROBNP in the last 8760 hours. HbA1C: No results for input(s): HGBA1C in the last 72 hours. CBG: Recent Labs  Lab 07/06/17 1225 07/06/17 1553 07/06/17 2041 07/07/17 0759 07/07/17 1212  GLUCAP 161* 138* 152* 110* 196*   Lipid Profile: No results for input(s): CHOL, HDL, LDLCALC, TRIG, CHOLHDL, LDLDIRECT in the last 72 hours. Thyroid Function  Tests: No results for input(s): TSH, T4TOTAL, FREET4, T3FREE, THYROIDAB in the last 72 hours. Anemia Panel: No results for input(s): VITAMINB12, FOLATE, FERRITIN, TIBC, IRON, RETICCTPCT in the last 72 hours. Urine analysis:    Component Value Date/Time   COLORURINE YELLOW 07/01/2017 0012   APPEARANCEUR CLEAR 07/01/2017 0012   LABSPEC 1.010 07/01/2017 0012   PHURINE 5.0 07/01/2017 0012   GLUCOSEU 50 (A) 07/01/2017 0012   HGBUR NEGATIVE 07/01/2017 0012   BILIRUBINUR NEGATIVE 07/01/2017 0012   KETONESUR NEGATIVE 07/01/2017 0012   PROTEINUR 100 (A) 07/01/2017 0012   UROBILINOGEN 0.2 07/15/2009 2249   NITRITE NEGATIVE 07/01/2017 0012   LEUKOCYTESUR NEGATIVE 07/01/2017 0012   Sepsis Labs: Invalid input(s): PROCALCITONIN, LACTICIDVEN  Recent Results (from the past 240 hour(s))  Respiratory Panel by PCR     Status: None   Collection Time: 07/02/17 10:01 AM  Result Value Ref Range Status   Adenovirus NOT DETECTED NOT DETECTED Final   Coronavirus 229E NOT DETECTED  NOT DETECTED Final   Coronavirus HKU1 NOT DETECTED NOT DETECTED Final   Coronavirus NL63 NOT DETECTED NOT DETECTED Final   Coronavirus OC43 NOT DETECTED NOT DETECTED Final   Metapneumovirus NOT DETECTED NOT DETECTED Final   Rhinovirus / Enterovirus NOT DETECTED NOT DETECTED Final   Influenza A NOT DETECTED NOT DETECTED Final   Influenza B NOT DETECTED NOT DETECTED Final   Parainfluenza Virus 1 NOT DETECTED NOT DETECTED Final   Parainfluenza Virus 2 NOT DETECTED NOT DETECTED Final   Parainfluenza Virus 3 NOT DETECTED NOT DETECTED Final   Parainfluenza Virus 4 NOT DETECTED NOT DETECTED Final   Respiratory Syncytial Virus NOT DETECTED NOT DETECTED Final   Bordetella pertussis NOT DETECTED NOT DETECTED Final   Chlamydophila pneumoniae NOT DETECTED NOT DETECTED Final   Mycoplasma pneumoniae NOT DETECTED NOT DETECTED Final    Comment: Performed at [facility], [address], [place], [place] [address]  Culture, blood  (routine x 2) Call MD if unable to obtain prior to antibiotics being given     Status: None   Collection Time: 07/02/17 10:25 AM  Result Value Ref Range Status   Specimen Description BLOOD RIGHT ARM  Final   Special Requests IN PEDIATRIC BOTTLE Blood Culture adequate volume  Final   Culture   Final    NO GROWTH 5 DAYS Performed at [facility], [address] [place], [place] [address]    Report Status 07/07/2017 FINAL  Final  Culture, blood (routine x 2) Call MD if unable to obtain prior to antibiotics being given     Status: Abnormal   Collection Time: 07/02/17 10:40 AM  Result Value Ref Range Status   Specimen Description BLOOD RIGHT FOREARM  Final   Special Requests IN PEDIATRIC BOTTLE Blood Culture adequate volume  Final   Culture  Setup Time   Final    GRAM POSITIVE COCCI IN CLUSTERS IN PEDIATRIC BOTTLE CRITICAL RESULT CALLED TO, READ BACK BY AND VERIFIED WITH: PHARMD D ZEIGLER [phone] MLM    Culture (A)  Final    STAPHYLOCOCCUS SPECIES (COAGULASE NEGATIVE) THE SIGNIFICANCE OF ISOLATING THIS ORGANISM FROM A SINGLE SET OF BLOOD CULTURES WHEN MULTIPLE SETS ARE DRAWN IS UNCERTAIN. PLEASE NOTIFY THE MICROBIOLOGY DEPARTMENT WITHIN ONE WEEK IF SPECIATION AND SENSITIVITIES ARE REQUIRED. Performed at [facility], [address], [place], [place] [address]    Report Status 07/04/2017 FINAL  Final  Blood Culture ID Panel (Reflexed)     Status: Abnormal   Collection Time: 07/02/17 10:40 AM  Result Value Ref Range Status   Enterococcus species NOT DETECTED NOT DETECTED Final   Listeria monocytogenes NOT DETECTED NOT DETECTED Final   Staphylococcus species DETECTED (A) NOT DETECTED Final    Comment: Methicillin (oxacillin) susceptible coagulase negative staphylococcus. Possible blood culture contaminant (unless isolated from more than one blood culture draw or clinical case suggests pathogenicity). No antibiotic treatment is indicated for blood  culture  contaminants. CRITICAL RESULT CALLED TO, READ BACK BY AND VERIFIED WITH: PHARMD D ZEIGLER [id number] MLM    Staphylococcus aureus NOT DETECTED NOT DETECTED Final   Methicillin resistance NOT DETECTED NOT DETECTED Final   Streptococcus species NOT DETECTED NOT DETECTED Final   Streptococcus agalactiae NOT DETECTED NOT DETECTED Final   Streptococcus pneumoniae NOT DETECTED NOT DETECTED Final   Streptococcus pyogenes NOT DETECTED NOT DETECTED Final   Acinetobacter baumannii NOT DETECTED NOT DETECTED Final   Enterobacteriaceae species NOT DETECTED NOT DETECTED Final  Enterobacter cloacae complex NOT DETECTED NOT DETECTED Final   Escherichia coli NOT DETECTED NOT DETECTED Final   Klebsiella oxytoca NOT DETECTED NOT DETECTED Final   Klebsiella pneumoniae NOT DETECTED NOT DETECTED Final   Proteus species NOT DETECTED NOT DETECTED Final   Serratia marcescens NOT DETECTED NOT DETECTED Final   Haemophilus influenzae NOT DETECTED NOT DETECTED Final   Neisseria meningitidis NOT DETECTED NOT DETECTED Final   Pseudomonas aeruginosa NOT DETECTED NOT DETECTED Final   Candida albicans NOT DETECTED NOT DETECTED Final   Candida glabrata NOT DETECTED NOT DETECTED Final   Candida krusei NOT DETECTED NOT DETECTED Final   Candida parapsilosis NOT DETECTED NOT DETECTED Final   Candida tropicalis NOT DETECTED NOT DETECTED Final    Comment: Performed at [facility], [name], [place], [place] [address]  MRSA PCR Screening     Status: None   Collection Time: 07/02/17  4:25 PM  Result Value Ref Range Status   MRSA by PCR NEGATIVE NEGATIVE Final    Comment:        The GeneXpert MRSA Assay (FDA approved for NASAL specimens only), is one component of a comprehensive MRSA colonization surveillance program. It is not intended to diagnose MRSA infection nor to guide or monitor treatment for MRSA infections. Performed at [facility], [address], [place], [place]  [address]       Radiology Studies: No results found.   Scheduled Meds: . allopurinol  100 mg Oral Q breakfast  . aspirin EC  81 mg Oral QODAY  . docusate sodium  100 mg Oral Daily  . docusate sodium  50 mg Oral QHS  . finasteride  5 mg Oral Q breakfast  . insulin aspart  0-15 Units Subcutaneous TID WC  . insulin aspart  0-5 Units Subcutaneous QHS  . metoprolol tartrate  12.5 mg Oral BID  . terazosin  5 mg Oral Q supper  . torsemide  40 mg Oral Daily   Continuous Infusions: . azithromycin Stopped (07/07/17 1227)  . cefTRIAXone (ROCEPHIN)  IV Stopped (07/07/17 1044)     Pamella Pert, MD, PhD [facility] Pager [phone]  If 7PM-7AM, please contact night-coverage [email or web address] Password [email or web address] 07/07/2017, 1:45 PM

## 2017-07-07 NOTE — Progress Notes (Signed)
Physical Therapy Treatment Patient Details Name: Fernando GeninRichard A Rubenstein MRN: [id number] DOB: Jan 24, 1946 Today's Date: 07/07/2017    History of Present Illness 72 y.o. male with history of diabetes mellitus type 2, CHF, hypertension, chronic kidney disease, morbid obesity who was recently diagnosed with dementia, began to have increased hallucinations,  progressive confusion and drowsy     In the ER patient is found to be drowsy with ABG showing hypercarbic respiratory failure.  Patient was placed on BiPAP.  CT head was unremarkable    PT Comments    Pt is progressing toward goals; incr pt effort overall today with improved ability to follow commands, +2 assist mainly for safety and lines for bed to chair transfers; continue current POC, will continue to assess for d/c needs and update as needed   Follow Up Recommendations  SNF     Equipment Recommendations  None recommended by PT    Recommendations for Other Services       Precautions / Restrictions Precautions Precautions: Fall Restrictions Weight Bearing Restrictions: No    Mobility  Bed Mobility Overal bed mobility: Needs Assistance Bed Mobility: Supine to Sit     Supine to sit: HOB elevated;Min guard     General bed mobility comments: incr time and cues for initiation/completion  Transfers Overall transfer level: Needs assistance Equipment used: Rolling walker (2 wheeled) Transfers: Sit to/from [employer]GI CorporationStand;Stand Pivot Transfers Sit to Stand: Min assist;+2 safety/equipment;From elevated surface Stand pivot transfers: Min assist;+2 physical assistance;+2 safety/equipment       General transfer comment: mutli-modal cues for hand placement, foot position and wt shift as well as to control descent  Ambulation/Gait Ambulation/Gait assistance: Min assist;+2 safety/equipment Ambulation Distance (Feet): 5 Feet(pivotal steps to chair) Assistive device: Rolling walker (2 wheeled)           Stairs            Wheelchair  Mobility    Modified Rankin (Stroke Patients Only)       Balance   Sitting-balance support: No upper extremity supported;Feet supported Sitting balance-Leahy Scale: Fair     Standing balance support: During functional activity;Bilateral upper extremity supported Standing balance-Leahy Scale: Poor                              Cognition Arousal/Alertness: Awake/alert Behavior During Therapy: WFL for tasks assessed/performed   Area of Impairment: Orientation                 Orientation Level: Disoriented to;Situation;Time Current Attention Level: Sustained   Following Commands: Follows one step commands with increased time;Follows multi-step commands inconsistently     Problem Solving: Slow processing;Decreased initiation;Difficulty sequencing General Comments: requires multi-modal cues for sequencing tasks and safety      Exercises General Exercises - Lower Extremity Ankle Circles/Pumps: AROM;Both;20 reps Long Arc Quad: AROM;Both;10 reps;Seated    General Comments        Pertinent Vitals/Pain Pain Assessment: No/denies pain    Home Living                      Prior Function            PT Goals (current goals can now be found in the care plan section) Acute Rehab PT Goals Patient Stated Goal: agreed to mobility for patient  PT Goal Formulation: With family Time For Goal Achievement: 07/17/17 Potential to Achieve Goals: Good Progress towards PT goals: Progressing toward goals  Frequency    Min 2X/week      PT Plan Current plan remains appropriate    Co-evaluation              AM-PAC PT "6 Clicks" Daily Activity  Outcome Measure  Difficulty turning over in bed (including adjusting bedclothes, sheets and blankets)?: Unable Difficulty moving from lying on back to sitting on the side of the bed? : A Little Difficulty sitting down on and standing up from a chair with arms (e.g., wheelchair, bedside commode,  etc,.)?: Unable Help needed moving to and from a bed to chair (including a wheelchair)?: A Little Help needed walking in hospital room?: A Lot Help needed climbing 3-5 steps with a railing? : A Lot 6 Click Score: 12    End of Session Equipment Utilized During Treatment: Gait belt Activity Tolerance: Patient tolerated treatment well Patient left: in chair;with call bell/phone within reach;with family/visitor present   PT Visit Diagnosis: Unsteadiness on feet (R26.81);Muscle weakness (generalized) (M62.81)     Time: 1610-9604 PT Time Calculation (min) (ACUTE ONLY): 25 min  Charges:                       G CodesDrucilla Chalet, PT Pager: [phone] 07/07/2017    [facility] 07/07/2017, 11:45 AM

## 2017-07-08 LAB — GLUCOSE, CAPILLARY
Glucose-Capillary: 100 mg/dL — ABNORMAL HIGH (ref 65–99)
Glucose-Capillary: 141 mg/dL — ABNORMAL HIGH (ref 65–99)
Glucose-Capillary: 116 mg/dL — ABNORMAL HIGH (ref 65–99)
Glucose-Capillary: 143 mg/dL — ABNORMAL HIGH (ref 65–99)

## 2017-07-08 MED ORDER — HYDROCORTISONE 0.5 % EX CREA
TOPICAL_CREAM | Freq: Two times a day (BID) | CUTANEOUS | Status: DC
  Administered 2017-07-08 – 2017-07-09 (×2): via TOPICAL
  Administered 2017-07-09: 1 via TOPICAL
  Administered 2017-07-10 – 2017-07-11 (×3): via TOPICAL
  Filled 2017-07-08 (×2): qty 28.35

## 2017-07-08 NOTE — Progress Notes (Signed)
Rt took pt off BIPAP and placed on 3LPM [place],vitals stable.

## 2017-07-08 NOTE — Progress Notes (Signed)
Report called and given to Canadohta LakeAshley, rn on 4th floor. Pt transferred to 1430 in wheelchair pushed by nurse tech and accompanied by male family member. Left and arrived in stable condition. Dr. Elvera LennoxGherghe made aware of red rash scattered to pt's back. MD came and assessed pt. Order placed.  Derinda SisVera Lashunda Greis,rn.

## 2017-07-08 NOTE — Progress Notes (Signed)
PROGRESS NOTE  Fernando [name] ZOX:[id number] DOB: [birth year] DOA: 06/30/2017 PCP: [facility]   LOS: 8 days   Brief Narrative / Interim history: 72 year old morbidly obese male with history of type 2 diabetes, diastolic CHF, hypertension, chronic kidney disease stage III, recently diagnosed with dementia within the past year on Aricept and Namenda, he was brought to the hospital on 06/30/2017 due to worsening mental status, increased hallucinations.  He was also confused and drowsy.  He was brought to the emergency room, he was found to have hypoxic and hypercarbic respiratory failure requiring BiPAP.  Chest x-ray showed evidence of a pneumonia and he was started on antibiotics.  Patient initially was very slow to progress, palliative care and pulmonology were consulted, however eventually improved and is able to tolerate a nightly BiPAP and nasal cannula during the day, and will be transferred to the floor on 2/9.  PT recommending SNF  Assessment & Plan: Active Problems:   HTN (hypertension)   Morbid obesity (HCC)   Acute respiratory failure with hypoxia and hypercapnia (HCC)   CKD (chronic kidney disease) stage 3, GFR 30-59 ml/min (HCC)   Dementia   Acute encephalopathy   Type 2 diabetes mellitus with renal complication (HCC)   Acute respiratory failure with hypoxia and hypercarbia (HCC)   Hypercapnic respiratory failure (HCC)   Acute on chronic respiratory failure with hypoxia and hypercarbia /community-acquired pneumonia -Suspect obesity hypoventilation as well as obstructive sleep apnea -Outpatient MD wanted to evaluate patient for OSA, however patient has expressed his wishes that he would not want to be on a mask when he sleeps.  Fernando [name] thinks now that he would reconsider.  Patient now agreeable to wear BiPAP -He has improved in the last few days, has been on BiPAP at nighttime and on nasal cannula during daytime for a few days in a row, mental status much better, transfer  to floor today 2/9 -Appreciate palliative care as well as pulmonology input, both teams signed off -Patient was started on ceftriaxone and azithromycin, today is day 7/7, discontinue antibiotics after today's dose -Blood cultures show 1/2 bottles coag negative staph, likely contaminant -Per pulmonary recommendations, patient is to have nightly BiPAP in the hospital, SNF, and home  Type 2 diabetes mellitus -Continue sliding scale, CBGs are between 100 - 190  Chronic kidney disease stage IV -Baseline creatinine 2-2.2 -Creatinine better than baseline, 1.7 >> 1.6, recheck tomorrow morning  Acute on chronic diastolic CHF -Most recent 2D echo was done in June 2018 showed an EF of 55-60% -Fluid status appears to be improving, he is net -9.4 L, continue torsemide  Dementia -Progressive over the last year, continue home medications  Hypertension -Metoprolol was decreased on 2/6 due to intermittent hypotension and to allow diuresis, blood pressure this morning 94/50 while sleeping however on repeat 119/69.  Continue current regimen  Metabolic encephalopathy secondary to hypercarbia -Patient close to baseline per family  Morbid obesity /deconditioning -PT evaluated patient on 2/8 recommending SNF, will consult social work today.  Patient and family seem agreeable   DVT prophylaxis: SCDs Code Status: DNR Family Communication: Discussed this with patient's daughter at bedside this morning Disposition Plan: Transfer to floor today, SNF 2-3 days if stable  Consultants:   Palliative care  Pulmonary   Procedures:   BiPAP  Antimicrobials:  Ceftriaxone 2/3 >>  Azithromycin 2/3 >>  Subjective: -He has no complaints this morning, appears in much better spirits than the other days.  Denies any chest pain, abdominal pain,  nausea or vomiting.  Denies any breathing difficulties.  Objective: Vitals:   07/08/17 0000 07/08/17 0401 07/08/17 0700 07/08/17 0906  BP: (!) 110/46     Pulse:  60     Resp: 20     Temp:  98.7 F (37.1 C) 98.2 F (36.8 C)   TempSrc:  Axillary Axillary   SpO2: 95%     Weight:    (!) 140.5 kg (309 lb 11.9 oz)  Height:        Intake/Output Summary (Last 24 hours) at 07/08/2017 1149 Last data filed at 07/08/2017 0200 Gross per 24 hour  Intake -  Output 1300 ml  Net -1300 ml   Filed Weights   07/06/17 0312 07/07/17 0500 07/08/17 0906  Weight: (!) 136.8 kg (301 lb 9.4 oz) 136 kg (299 lb 13.2 oz) (!) 140.5 kg (309 lb 11.9 oz)    Examination:  Constitutional: No distress, pleasant, smiling, mildly confused Eyes: No scleral icterus, lids and conjunctivae normal ENMT: Moist mucous membranes, small wound at the base of the nose from BiPAP Respiratory: Overall decreased breath sounds in the setting of large body habitus, faint end expiratory wheezing, no crackles, no rhonchi Cardiovascular: Regular rate and rhythm, no murmurs, trace lower extremity edema Abdomen: Obese, soft, nontender, nondistended, bowel sounds positive Skin: No rashes seen Neurologic: Follows commands, equal strength.  Alert to person and place, not time   Data Reviewed: I have independently reviewed following labs and imaging studies   CBC: Recent Labs  Lab 07/02/17 0638 07/06/17 0320 07/07/17 0252  WBC 7.2 6.5 5.6  HGB 9.2* 9.3* 9.2*  HCT 33.9* 33.5* 32.4*  MCV 87.1 84.4 84.6  PLT 217 195 189   Basic Metabolic Panel: Recent Labs  Lab 07/03/17 0500 07/04/17 0753 07/05/17 0257 07/06/17 0320 07/07/17 0252  NA 145 142 142 141 141  K 4.1 4.0 3.8 3.5 3.5  CL 102 96* 95* 94* 94*  CO2 31 36* 37* 37* 39*  GLUCOSE 103* 117* 99 117* 112*  BUN 35* 35* 37* 33* 30*  CREATININE 2.20* 2.17* 2.00* 1.79* 1.63*  CALCIUM 8.4* 8.5* 8.7* 8.5* 8.6*   GFR: Estimated Creatinine Clearance: 59.6 mL/min (A) (by C-G formula based on SCr of 1.63 mg/dL (H)). Liver Function Tests: Recent Labs  Lab 07/02/17 0638  AST 11*  ALT 8*  ALKPHOS 39  BILITOT 0.5  PROT 5.8*  ALBUMIN  2.6*   No results for input(s): LIPASE, AMYLASE in the last 168 hours. No results for input(s): AMMONIA in the last 168 hours. Coagulation Profile: No results for input(s): INR, PROTIME in the last 168 hours. Cardiac Enzymes: No results for input(s): CKTOTAL, CKMB, CKMBINDEX, TROPONINI in the last 168 hours. BNP (last 3 results) No results for input(s): PROBNP in the last 8760 hours. HbA1C: No results for input(s): HGBA1C in the last 72 hours. CBG: Recent Labs  Lab 07/07/17 0759 07/07/17 1212 07/07/17 1547 07/07/17 2207 07/08/17 0758  GLUCAP 110* 196* 129* 117* 100*   Lipid Profile: No results for input(s): CHOL, HDL, LDLCALC, TRIG, CHOLHDL, LDLDIRECT in the last 72 hours. Thyroid Function Tests: No results for input(s): TSH, T4TOTAL, FREET4, T3FREE, THYROIDAB in the last 72 hours. Anemia Panel: No results for input(s): VITAMINB12, FOLATE, FERRITIN, TIBC, IRON, RETICCTPCT in the last 72 hours. Urine analysis:    Component Value Date/Time   COLORURINE YELLOW 07/01/2017 0012   APPEARANCEUR CLEAR 07/01/2017 0012   LABSPEC 1.010 07/01/2017 0012   PHURINE 5.0 07/01/2017 0012   GLUCOSEU 50 (A)  07/01/2017 0012   HGBUR NEGATIVE 07/01/2017 0012   BILIRUBINUR NEGATIVE 07/01/2017 0012   KETONESUR NEGATIVE 07/01/2017 0012   PROTEINUR 100 (A) 07/01/2017 0012   UROBILINOGEN 0.2 07/15/2009 2249   NITRITE NEGATIVE 07/01/2017 0012   LEUKOCYTESUR NEGATIVE 07/01/2017 0012   Sepsis Labs: Invalid input(s): PROCALCITONIN, LACTICIDVEN  Recent Results (from the past 240 hour(s))  Respiratory Panel by PCR     Status: None   Collection Time: 07/02/17 10:01 AM  Result Value Ref Range Status   Adenovirus NOT DETECTED NOT DETECTED Final   Coronavirus 229E NOT DETECTED NOT DETECTED Final   Coronavirus HKU1 NOT DETECTED NOT DETECTED Final   Coronavirus NL63 NOT DETECTED NOT DETECTED Final   Coronavirus OC43 NOT DETECTED NOT DETECTED Final   Metapneumovirus NOT DETECTED NOT DETECTED Final    Rhinovirus / Enterovirus NOT DETECTED NOT DETECTED Final   Influenza A NOT DETECTED NOT DETECTED Final   Influenza B NOT DETECTED NOT DETECTED Final   Parainfluenza Virus 1 NOT DETECTED NOT DETECTED Final   Parainfluenza Virus 2 NOT DETECTED NOT DETECTED Final   Parainfluenza Virus 3 NOT DETECTED NOT DETECTED Final   Parainfluenza Virus 4 NOT DETECTED NOT DETECTED Final   Respiratory Syncytial Virus NOT DETECTED NOT DETECTED Final   Bordetella pertussis NOT DETECTED NOT DETECTED Final   Chlamydophila pneumoniae NOT DETECTED NOT DETECTED Final   Mycoplasma pneumoniae NOT DETECTED NOT DETECTED Final    Comment: Performed at [facility], [address], [place], [place] [address]  Culture, blood (routine x 2) Call MD if unable to obtain prior to antibiotics being given     Status: None   Collection Time: 07/02/17 10:25 AM  Result Value Ref Range Status   Specimen Description BLOOD RIGHT ARM  Final   Special Requests IN PEDIATRIC BOTTLE Blood Culture adequate volume  Final   Culture   Final    NO GROWTH 5 DAYS Performed at [facility], [address] [place], [place] [address]    Report Status 07/07/2017 FINAL  Final  Culture, blood (routine x 2) Call MD if unable to obtain prior to antibiotics being given     Status: Abnormal   Collection Time: 07/02/17 10:40 AM  Result Value Ref Range Status   Specimen Description BLOOD RIGHT FOREARM  Final   Special Requests IN PEDIATRIC BOTTLE Blood Culture adequate volume  Final   Culture  Setup Time   Final    GRAM POSITIVE COCCI IN CLUSTERS IN PEDIATRIC BOTTLE CRITICAL RESULT CALLED TO, READ BACK BY AND VERIFIED WITH: PHARMD D ZEIGLER [phone] MLM    Culture (A)  Final    STAPHYLOCOCCUS SPECIES (COAGULASE NEGATIVE) THE SIGNIFICANCE OF ISOLATING THIS ORGANISM FROM A SINGLE SET OF BLOOD CULTURES WHEN MULTIPLE SETS ARE DRAWN IS UNCERTAIN. PLEASE NOTIFY THE MICROBIOLOGY DEPARTMENT WITHIN ONE WEEK IF SPECIATION AND SENSITIVITIES ARE  REQUIRED. Performed at [facility], [name], [place], [place] [address]    Report Status 07/04/2017 FINAL  Final  Blood Culture ID Panel (Reflexed)     Status: Abnormal   Collection Time: 07/02/17 10:40 AM  Result Value Ref Range Status   Enterococcus species NOT DETECTED NOT DETECTED Final   Listeria monocytogenes NOT DETECTED NOT DETECTED Final   Staphylococcus species DETECTED (A) NOT DETECTED Final    Comment: Methicillin (oxacillin) susceptible coagulase negative staphylococcus. Possible blood culture contaminant (unless isolated from more than one blood culture draw or clinical case suggests pathogenicity). No antibiotic  treatment is indicated for blood  culture contaminants. CRITICAL RESULT CALLED TO, READ BACK BY AND VERIFIED WITH: PHARMD D ZEIGLER [id number] [phone] MLM    Staphylococcus aureus NOT DETECTED NOT DETECTED Final   Methicillin resistance NOT DETECTED NOT DETECTED Final   Streptococcus species NOT DETECTED NOT DETECTED Final   Streptococcus agalactiae NOT DETECTED NOT DETECTED Final   Streptococcus pneumoniae NOT DETECTED NOT DETECTED Final   Streptococcus pyogenes NOT DETECTED NOT DETECTED Final   Acinetobacter baumannii NOT DETECTED NOT DETECTED Final   Enterobacteriaceae species NOT DETECTED NOT DETECTED Final   Enterobacter cloacae complex NOT DETECTED NOT DETECTED Final   Escherichia coli NOT DETECTED NOT DETECTED Final   Klebsiella oxytoca NOT DETECTED NOT DETECTED Final   Klebsiella pneumoniae NOT DETECTED NOT DETECTED Final   Proteus species NOT DETECTED NOT DETECTED Final   Serratia marcescens NOT DETECTED NOT DETECTED Final   Haemophilus influenzae NOT DETECTED NOT DETECTED Final   Neisseria meningitidis NOT DETECTED NOT DETECTED Final   Pseudomonas aeruginosa NOT DETECTED NOT DETECTED Final   Candida albicans NOT DETECTED NOT DETECTED Final   Candida glabrata NOT DETECTED NOT DETECTED Final   Candida krusei NOT DETECTED NOT DETECTED Final    Candida parapsilosis NOT DETECTED NOT DETECTED Final   Candida tropicalis NOT DETECTED NOT DETECTED Final    Comment: Performed at [facility], [address] [place], [place] [address]  MRSA PCR Screening     Status: None   Collection Time: 07/02/17  4:25 PM  Result Value Ref Range Status   MRSA by PCR NEGATIVE NEGATIVE Final    Comment:        The GeneXpert MRSA Assay (FDA approved for NASAL specimens only), is one component of a comprehensive MRSA colonization surveillance program. It is not intended to diagnose MRSA infection nor to guide or monitor treatment for MRSA infections. Performed at [facility], [address] [place], [place] [address]       Radiology Studies: No results found.   Scheduled Meds: . allopurinol  100 mg Oral Q breakfast  . aspirin EC  81 mg Oral QODAY  . docusate sodium  100 mg Oral Daily  . docusate sodium  50 mg Oral QHS  . finasteride  5 mg Oral Q breakfast  . insulin aspart  0-15 Units Subcutaneous TID WC  . insulin aspart  0-5 Units Subcutaneous QHS  . metoprolol tartrate  12.5 mg Oral BID  . terazosin  5 mg Oral Q supper  . torsemide  40 mg Oral Daily   Continuous Infusions:    Pamella Pertostin Lakendrick Paradis, MD, PhD [facility] Pager [phone]  If 7PM-7AM, please contact night-coverage [email or web address] Password [email or web address] 07/08/2017, 11:49 AM

## 2017-07-09 LAB — GLUCOSE, CAPILLARY
GLUCOSE-CAPILLARY: 114 mg/dL — AB (ref 65–99)
GLUCOSE-CAPILLARY: 139 mg/dL — AB (ref 65–99)
GLUCOSE-CAPILLARY: 135 mg/dL — AB (ref 65–99)
Glucose-Capillary: 153 mg/dL — ABNORMAL HIGH (ref 65–99)

## 2017-07-09 LAB — BASIC METABOLIC PANEL
ANION GAP: 10 (ref 5–15)
BUN: 28 mg/dL — ABNORMAL HIGH (ref 6–20)
CALCIUM: 8.9 mg/dL (ref 8.9–10.3)
CO2: 39 mmol/L — ABNORMAL HIGH (ref 22–32)
Chloride: 90 mmol/L — ABNORMAL LOW (ref 101–111)
Creatinine, Ser: 1.51 mg/dL — ABNORMAL HIGH (ref 0.61–1.24)
GFR calc Af Amer: 51 mL/min — ABNORMAL LOW (ref >60)
GFR calc non Af Amer: 44 mL/min — ABNORMAL LOW (ref >60)
Glucose, Bld: 126 mg/dL — ABNORMAL HIGH (ref 65–99)
Potassium: 3.5 mmol/L (ref 3.5–5.1)
Sodium: 139 mmol/L (ref 135–145)

## 2017-07-09 LAB — CBC
HCT: 34 % — ABNORMAL LOW (ref 39.0–52.0)
HEMOGLOBIN: 9.7 g/dL — AB (ref 13.0–17.0)
MCH: 23.7 pg — AB (ref 26.0–34.0)
MCHC: 28.5 g/dL — ABNORMAL LOW (ref 30.0–36.0)
MCV: 82.9 fL (ref 78.0–100.0)
PLATELETS: 233 10*3/uL (ref 150–400)
RBC: 4.1 MIL/uL — ABNORMAL LOW (ref 4.22–5.81)
RDW: 16 % — ABNORMAL HIGH (ref 11.5–15.5)
WBC: 6.3 10*3/uL (ref 4.0–10.5)

## 2017-07-09 MED ORDER — IPRATROPIUM-ALBUTEROL 0.5-2.5 (3) MG/3ML IN SOLN
3.0000 mL | Freq: Four times a day (QID) | RESPIRATORY_TRACT | Status: DC
  Administered 2017-07-09 (×2): 3 mL via RESPIRATORY_TRACT
  Filled 2017-07-09 (×2): qty 3

## 2017-07-09 MED ORDER — IPRATROPIUM-ALBUTEROL 0.5-2.5 (3) MG/3ML IN SOLN
3.0000 mL | Freq: Two times a day (BID) | RESPIRATORY_TRACT | Status: DC
  Administered 2017-07-10 (×2): 3 mL via RESPIRATORY_TRACT
  Filled 2017-07-09 (×2): qty 3

## 2017-07-09 NOTE — Plan of Care (Signed)
  Progressing Health Behavior/Discharge Planning: Ability to manage health-related needs will improve 07/09/2017 0119 - Progressing by Cristela FeltSteffens, Mickal Meno P, RN Elimination: Will not experience complications related to bowel motility 07/09/2017 0119 - Progressing by Cristela FeltSteffens, Milliani Herrada P, RN Will not experience complications related to urinary retention 07/09/2017 0119 - Progressing by Cristela FeltSteffens, Caidon Foti P, RN Pain Managment: General experience of comfort will improve 07/09/2017 0119 - Progressing by Cristela FeltSteffens, Tkai Serfass P, RN Safety: Ability to remain free from injury will improve 07/09/2017 0119 - Progressing by Cristela FeltSteffens, Carnetta Losada P, RN Skin Integrity: Risk for impaired skin integrity will decrease 07/09/2017 0119 - Progressing by Cristela FeltSteffens, Aylani Spurlock P, RN

## 2017-07-09 NOTE — Progress Notes (Signed)
[facility]                                                                              Patient Demographics  Fernando [name], is a 72 y.o. male, DOB - 22-Apr-1946, ZOX:[id number]  Admit date - 06/30/2017   Admitting Physician Fernando Kief, MD  Outpatient Primary MD for the patient is [facility]  Outpatient specialists:   LOS - 9  days   Medical records reviewed and are as summarized below:    Chief Complaint  Patient presents with  . Altered Mental Status       Brief summary   72 year old morbidly obese male with history of type 2 diabetes, diastolic CHF, hypertension, chronic kidney disease stage III, recently diagnosed with dementia within the past year on Aricept and Namenda, he was brought to the hospital on 06/30/2017 due to worsening mental status, increased hallucinations.  He was also confused and drowsy.  He was brought to the emergency room, he was found to have hypoxic and hypercarbic respiratory failure requiring BiPAP.  Chest x-ray showed evidence of a pneumonia and he was started on antibiotics.  Patient initially was very slow to progress, palliative care and pulmonology were consulted, however eventually improved and is able to tolerate a nightly BiPAP and nasal cannula during the day, and will be transferred to the floor on 2/9.  PT recommending SNF   Assessment & Plan    Acute on chronic respiratory failure with hypoxia and hypercarbia /community-acquired pneumonia -Multifactorial, likely has underlying OSA with obesity hypoventilation, PNA, fluid overload -Currently improving, overnight needs BiPAP. -Appreciate pulmonology recommendations, chronically hypercarbic and baseline, recommended stable regimen of NIPPV/BIPAP at night and during the day when he naps. -Palliative care was consulted, signed off -Patient has completed Zithromax, ceftriaxone 7-day course - Blood cultures 1/2 showed currently is negative staph, likely  contaminant -Case management consult placed for home BiPAP or trilogy.  Patient and family declined skilled nursing facility and would like home health PT OT, home BiPAP. -Will need outpatient pulmonology referral  Type 2 diabetes mellitus -BP stable, continue sliding scale insulin   Chronic kidney disease stage IV -Baseline creatinine 2-2.2 -Creatinine stable, 1.5 and improving   Acute on chronic diastolic CHF -Most recent 2D echo was done in June 2018 showed an EF of 55-60% -Net -10.6 L, continue torsemide  Dementia -Progressive over the last year, continue home medications  Hypertension -Metoprolol was decreased on 2/6 due to intermittent hypotension and to allow diuresis -Continue current regimen, beta-blocker, torsemide  Metabolic encephalopathy secondary to hypercarbia -  Discussed with family (wife and daughter), feels that he is close to his baseline mental status and they would prefer him to be at home with home health  Morbid obesity /deconditioning -PT evaluation recommended skilled nursing facility on 2/8 however family declining skilled nursing facility and would prefer home health at home  Code Status: DNR DVT Prophylaxis:  SCD's Family Communication: Discussed in detail with the patient, all imaging results, lab results explained to the patient, wife, daughter at the bedside   Disposition Plan: Once home BiPAP is available  Time Spent in minutes  25 minutes  Procedures:  Bipap  Consultants:   Pulmonology  Antimicrobials:      Medications  Scheduled Meds: . allopurinol  100 mg Oral Q breakfast  . aspirin EC  81 mg Oral QODAY  . docusate sodium  100 mg Oral Daily  . docusate sodium  50 mg Oral QHS  . finasteride  5 mg Oral Q breakfast  . hydrocortisone cream   Topical BID  . insulin aspart  0-15 Units Subcutaneous TID WC  . insulin aspart  0-5 Units Subcutaneous QHS  . ipratropium-albuterol  3 mL Nebulization Q6H  . metoprolol tartrate   12.5 mg Oral BID  . terazosin  5 mg Oral Q supper  . torsemide  40 mg Oral Daily   Continuous Infusions: PRN Meds:.acetaminophen **OR** acetaminophen, hydrALAZINE, labetalol, ondansetron **OR** ondansetron (ZOFRAN) IV   Antibiotics   Anti-infectives (From admission, onward)   Start     Dose/Rate Route Frequency Ordered Stop   07/02/17 1030  azithromycin (ZITHROMAX) 500 mg in dextrose 5 % 250 mL IVPB     500 mg 250 mL/hr over 60 Minutes Intravenous Every 24 hours 07/02/17 0937 07/08/17 1150   07/02/17 1000  cefTRIAXone (ROCEPHIN) 1 g in dextrose 5 % 50 mL IVPB     1 g 100 mL/hr over 30 Minutes Intravenous Every 24 hours 07/02/17 0937 07/08/17 0940        Subjective:   Fernando [name] was seen and examined today.  Feels better, needed BiPAP overnight. Patient denies dizziness, chest pain, abdominal pain, N/V/D/C, new weakness, numbess, tingling. No acute events overnight.    Objective:   Vitals:   07/08/17 1453 07/08/17 1954 07/09/17 0500 07/09/17 0643  BP: 137/77 (!) 142/69  (!) 162/92  Pulse: 67 77  68  Resp: 16 (!) 22  20  Temp: 98.4 F (36.9 C) 98.2 F (36.8 C)  97.6 F (36.4 C)  TempSrc: Oral Oral  Oral  SpO2: 96% 95%  95%  Weight: 135.5 kg (298 lb 11.6 oz)  (!) 136.1 kg (300 lb 0.7 oz)   Height:        Intake/Output Summary (Last 24 hours) at 07/09/2017 1231 Last data filed at 07/09/2017 1100 Gross per 24 hour  Intake 60 ml  Output 1200 ml  Net -1140 ml     Wt Readings from Last 3 Encounters:  07/09/17 (!) 136.1 kg (300 lb 0.7 oz)  05/11/17 (!) 142.4 kg (314 lb)  02/06/17 (!) 138.8 kg (306 lb)     Exam  General: Alert and oriented x 3, NAD  Eyes:   HEENT:  Atraumatic, normocephalic, wound on the nose    Cardiovascular: S1 S2 auscultated, no rubs, murmurs or gallops. Regular rate and rhythm.  Respiratory: Decreased breath sound at the bases, mild expiratory wheezing  Gastrointestinal: Soft, nontender, nondistended, + bowel sounds  Ext: no  pedal edema bilaterally  Neuro: AAOx3, Cr N's II- XII. Strength 5/5 upper and lower extremities bilaterally  Musculoskeletal: No digital cyanosis, clubbing  Skin: No rashes  Psych: Normal affect and demeanor, alert and oriented x3    Data Reviewed:  I have personally reviewed following labs and imaging studies  Micro Results Recent Results (from the past 240 hour(s))  Respiratory Panel by PCR     Status: None   Collection Time: 07/02/17 10:01 AM  Result Value Ref Range Status   Adenovirus NOT DETECTED NOT DETECTED Final   Coronavirus 229E NOT DETECTED NOT DETECTED Final   Coronavirus HKU1 NOT DETECTED  NOT DETECTED Final   Coronavirus NL63 NOT DETECTED NOT DETECTED Final   Coronavirus OC43 NOT DETECTED NOT DETECTED Final   Metapneumovirus NOT DETECTED NOT DETECTED Final   Rhinovirus / Enterovirus NOT DETECTED NOT DETECTED Final   Influenza A NOT DETECTED NOT DETECTED Final   Influenza B NOT DETECTED NOT DETECTED Final   Parainfluenza Virus 1 NOT DETECTED NOT DETECTED Final   Parainfluenza Virus 2 NOT DETECTED NOT DETECTED Final   Parainfluenza Virus 3 NOT DETECTED NOT DETECTED Final   Parainfluenza Virus 4 NOT DETECTED NOT DETECTED Final   Respiratory Syncytial Virus NOT DETECTED NOT DETECTED Final   Bordetella pertussis NOT DETECTED NOT DETECTED Final   Chlamydophila pneumoniae NOT DETECTED NOT DETECTED Final   Mycoplasma pneumoniae NOT DETECTED NOT DETECTED Final    Comment: Performed at [facility], [address] [place], [place] [address]  Culture, blood (routine x 2) Call MD if unable to obtain prior to antibiotics being given     Status: None   Collection Time: 07/02/17 10:25 AM  Result Value Ref Range Status   Specimen Description BLOOD RIGHT ARM  Final   Special Requests IN PEDIATRIC BOTTLE Blood Culture adequate volume  Final   Culture   Final    NO GROWTH 5 DAYS Performed at [facility], [address], [place], [place] [address]    Report Status  07/07/2017 FINAL  Final  Culture, blood (routine x 2) Call MD if unable to obtain prior to antibiotics being given     Status: Abnormal   Collection Time: 07/02/17 10:40 AM  Result Value Ref Range Status   Specimen Description BLOOD RIGHT FOREARM  Final   Special Requests IN PEDIATRIC BOTTLE Blood Culture adequate volume  Final   Culture  Setup Time   Final    GRAM POSITIVE COCCI IN CLUSTERS IN PEDIATRIC BOTTLE CRITICAL RESULT CALLED TO, READ BACK BY AND VERIFIED WITH: PHARMD D ZEIGLER [id number] MLM    Culture (A)  Final    STAPHYLOCOCCUS SPECIES (COAGULASE NEGATIVE) THE SIGNIFICANCE OF ISOLATING THIS ORGANISM FROM A SINGLE SET OF BLOOD CULTURES WHEN MULTIPLE SETS ARE DRAWN IS UNCERTAIN. PLEASE NOTIFY THE MICROBIOLOGY DEPARTMENT WITHIN ONE WEEK IF SPECIATION AND SENSITIVITIES ARE REQUIRED. Performed at [facility], [address] [place], [place] [address]    Report Status 07/04/2017 FINAL  Final  Blood Culture ID Panel (Reflexed)     Status: Abnormal   Collection Time: 07/02/17 10:40 AM  Result Value Ref Range Status   Enterococcus species NOT DETECTED NOT DETECTED Final   Listeria monocytogenes NOT DETECTED NOT DETECTED Final   Staphylococcus species DETECTED (A) NOT DETECTED Final    Comment: Methicillin (oxacillin) susceptible coagulase negative staphylococcus. Possible blood culture contaminant (unless isolated from more than one blood culture draw or clinical case suggests pathogenicity). No antibiotic treatment is indicated for blood  culture contaminants. CRITICAL RESULT CALLED TO, READ BACK BY AND VERIFIED WITH: PHARMD D ZEIGLER [id number] MLM    Staphylococcus aureus NOT DETECTED NOT DETECTED Final   Methicillin resistance NOT DETECTED NOT DETECTED Final   Streptococcus species NOT DETECTED NOT DETECTED Final   Streptococcus agalactiae NOT DETECTED NOT DETECTED Final   Streptococcus pneumoniae NOT DETECTED NOT DETECTED Final   Streptococcus pyogenes NOT DETECTED  NOT DETECTED Final   Acinetobacter baumannii NOT DETECTED NOT DETECTED Final   Enterobacteriaceae species NOT DETECTED NOT DETECTED Final   Enterobacter cloacae complex NOT DETECTED NOT DETECTED Final  Escherichia coli NOT DETECTED NOT DETECTED Final   Klebsiella oxytoca NOT DETECTED NOT DETECTED Final   Klebsiella pneumoniae NOT DETECTED NOT DETECTED Final   Proteus species NOT DETECTED NOT DETECTED Final   Serratia marcescens NOT DETECTED NOT DETECTED Final   Haemophilus influenzae NOT DETECTED NOT DETECTED Final   Neisseria meningitidis NOT DETECTED NOT DETECTED Final   Pseudomonas aeruginosa NOT DETECTED NOT DETECTED Final   Candida albicans NOT DETECTED NOT DETECTED Final   Candida glabrata NOT DETECTED NOT DETECTED Final   Candida krusei NOT DETECTED NOT DETECTED Final   Candida parapsilosis NOT DETECTED NOT DETECTED Final   Candida tropicalis NOT DETECTED NOT DETECTED Final    Comment: Performed at [facility], [address], [place], [place] [address]  MRSA PCR Screening     Status: None   Collection Time: 07/02/17  4:25 PM  Result Value Ref Range Status   MRSA by PCR NEGATIVE NEGATIVE Final    Comment:        The GeneXpert MRSA Assay (FDA approved for NASAL specimens only), is one component of a comprehensive MRSA colonization surveillance program. It is not intended to diagnose MRSA infection nor to guide or monitor treatment for MRSA infections. Performed at [facility], [address] [place], [place] [address]     Radiology Reports Dg Chest 2 View  Result Date: 06/30/2017 CLINICAL DATA:  Altered mental status.  Hypoxia. EXAM: CHEST  2 VIEW COMPARISON:  01/24/2017 FINDINGS: Low lung volumes. Cardiomegaly. Bibasilar scarring or subsegmental atelectasis. No definite consolidation or edema. No pneumothorax. Bones grossly unremarkable. IMPRESSION: Low lung volumes. No definite active infiltrates or failure. Cardiomegaly. Electronically  Signed   By: Elsie Stain M.D.   On: 06/30/2017 14:22   Ct Head Wo Contrast  Result Date: 06/30/2017 CLINICAL DATA:  Wife verbalizes pt "not acting like self; seeing things that aren't there; and asking if we are home when we are already there" since yesterday afternoon around 2. Pt denies weakness, numbness, or pain. Dementia. EXAM: CT HEAD WITHOUT CONTRAST TECHNIQUE: Contiguous axial images were obtained from the base of the skull through the vertex without intravenous contrast. COMPARISON:  07/16/2009 FINDINGS: Brain: There is moderate central and cortical atrophy. Periventricular white matter changes are consistent with small vessel disease. There is no intra or extra-axial fluid collection or mass lesion. The basilar cisterns and ventricles have a normal appearance. There is no CT evidence for acute infarction or hemorrhage. Vascular: No hyperdense vessel or unexpected calcification. Skull: Normal. Negative for fracture or focal lesion. Sinuses/Orbits: No acute finding. Other: None IMPRESSION: 1. Atrophy and small vessel disease. 2.  No evidence for acute intracranial abnormality. Electronically Signed   By: Norva Pavlov M.D.   On: 06/30/2017 16:32   Dg Chest Port 1 View  Result Date: 07/02/2017 CLINICAL DATA:  Fever EXAM: PORTABLE CHEST 1 VIEW COMPARISON:  06/30/2017; 01/24/2017 FINDINGS: Grossly unchanged enlarged cardiac silhouette and mediastinal contours given persistently reduced lung volumes. Worsening bibasilar heterogeneous/consolidative opacities, left greater than right. Trace left-sided effusion is not excluded. Pulmonary vasculature appears less distinct than present examination with cephalization of flow. No definite pneumothorax. No acute osseus abnormalities. IMPRESSION: 1. Worsening bibasilar heterogeneous/consolidative opacities, left greater than right, atelectasis versus infiltrate. Further evaluation with a PA and lateral chest radiograph may be obtained as clinically indicated.  2. Suspected mild pulmonary venous congestion with potential development of a trace left-sided effusion. Electronically Signed   By: Simonne Come M.D.   On:  07/02/2017 09:07    Lab Data:  CBC: Recent Labs  Lab 07/06/17 0320 07/07/17 0252 07/09/17 0435  WBC 6.5 5.6 6.3  HGB 9.3* 9.2* 9.7*  HCT 33.5* 32.4* 34.0*  MCV 84.4 84.6 82.9  PLT 195 189 233   Basic Metabolic Panel: Recent Labs  Lab 07/04/17 0753 07/05/17 0257 07/06/17 0320 07/07/17 0252 07/09/17 0435  NA 142 142 141 141 139  K 4.0 3.8 3.5 3.5 3.5  CL 96* 95* 94* 94* 90*  CO2 36* 37* 37* 39* 39*  GLUCOSE 117* 99 117* 112* 126*  BUN 35* 37* 33* 30* 28*  CREATININE 2.17* 2.00* 1.79* 1.63* 1.51*  CALCIUM 8.5* 8.7* 8.5* 8.6* 8.9   GFR: Estimated Creatinine Clearance: 63.2 mL/min (A) (by C-G formula based on SCr of 1.51 mg/dL (H)). Liver Function Tests: No results for input(s): AST, ALT, ALKPHOS, BILITOT, PROT, ALBUMIN in the last 168 hours. No results for input(s): LIPASE, AMYLASE in the last 168 hours. No results for input(s): AMMONIA in the last 168 hours. Coagulation Profile: No results for input(s): INR, PROTIME in the last 168 hours. Cardiac Enzymes: No results for input(s): CKTOTAL, CKMB, CKMBINDEX, TROPONINI in the last 168 hours. BNP (last 3 results) No results for input(s): PROBNP in the last 8760 hours. HbA1C: No results for input(s): HGBA1C in the last 72 hours. CBG: Recent Labs  Lab 07/08/17 1237 07/08/17 1808 07/08/17 2106 07/09/17 0747 07/09/17 1144  GLUCAP 141* 116* 143* 114* 139*   Lipid Profile: No results for input(s): CHOL, HDL, LDLCALC, TRIG, CHOLHDL, LDLDIRECT in the last 72 hours. Thyroid Function Tests: No results for input(s): TSH, T4TOTAL, FREET4, T3FREE, THYROIDAB in the last 72 hours. Anemia Panel: No results for input(s): VITAMINB12, FOLATE, FERRITIN, TIBC, IRON, RETICCTPCT in the last 72 hours. Urine analysis:    Component Value Date/Time   COLORURINE YELLOW 07/01/2017  0012   APPEARANCEUR CLEAR 07/01/2017 0012   LABSPEC 1.010 07/01/2017 0012   PHURINE 5.0 07/01/2017 0012   GLUCOSEU 50 (A) 07/01/2017 0012   HGBUR NEGATIVE 07/01/2017 0012   BILIRUBINUR NEGATIVE 07/01/2017 0012   KETONESUR NEGATIVE 07/01/2017 0012   PROTEINUR 100 (A) 07/01/2017 0012   UROBILINOGEN 0.2 07/15/2009 2249   NITRITE NEGATIVE 07/01/2017 0012   LEUKOCYTESUR NEGATIVE 07/01/2017 0012     Ripudeep Rai M.D. [facility] 07/09/2017, 12:31 PM  Pager: [phone] Between 7am to 7pm - call Pager - [phone]  After 7pm go to [email or web address] - password [email or web address]  Call night coverage person covering after 7pm

## 2017-07-10 LAB — GLUCOSE, CAPILLARY
GLUCOSE-CAPILLARY: 130 mg/dL — AB (ref 65–99)
Glucose-Capillary: 122 mg/dL — ABNORMAL HIGH (ref 65–99)
Glucose-Capillary: 134 mg/dL — ABNORMAL HIGH (ref 65–99)
Glucose-Capillary: 119 mg/dL — ABNORMAL HIGH (ref 65–99)

## 2017-07-10 NOTE — Progress Notes (Signed)
Pt. placed on BiPAP for h/s, humidifier filled with SW, oxygen placed into circuit, wife remains at bedside, tolerating well.

## 2017-07-10 NOTE — Progress Notes (Signed)
SATURATION QUALIFICATIONS: (This note is used to comply with regulatory documentation for home oxygen)  Patient Saturations on Room Air at Rest = 90%  Patient Saturations on Room Air while Ambulating = 86%  Patient Saturations on 3 Liters of oxygen while Ambulating =94%%  Please briefly explain why patient needs home oxygen: oxygen saturation drops on RA. Requires  3 liters of oxygen to maintain  satration > 90%.Blanchard KelchKaren Oshen Wlodarczyk PT [phone]

## 2017-07-10 NOTE — Progress Notes (Signed)
CSW met with pt to follow up re: DC plans (while in ICU previous CSW discussed the then-current recommendation for SNF with pt and wife) Pt and wife report at this point they are planning to have pt return directly home at DC, feeling pt's ambulation has improved to the point they could manage his care at home. Wife states PTA she was assisting pt with all ADLs.  Wife reports they are still working to formulate plan re: pt needing "bipap, cpap, or oxygen at home." All new needs. Do want HHPT and possible OT if needed. Also want outpatient palliative care follow-up.  CM following as well. Will continue monitoring/assessing for DC needs.  Plan: pt planning to return home at DC with Home health.   Sharren Bridge, MSW, LCSW Clinical Social Work 07/10/2017 [phone]

## 2017-07-10 NOTE — Progress Notes (Signed)
Spoke with pt's wife concerning HH and DME. For [facility] to [facility] BiPAP machine [facility] doctor will need to write the order. Mrs. Lovell SheehanJenkins selected [facility] for Home health. MD will need orders for HHRN/PT, DME BiPAP machine, and tub bench please.  Thank you.

## 2017-07-10 NOTE — Progress Notes (Signed)
Physical Therapy Treatment Patient Details Name: Fernando [name] MRN: [id number] DOB: [birth year] Today's Date: 07/10/2017    History of Present Illness 72 y.o. male with history of diabetes mellitus type 2, CHF, hypertension, chronic kidney disease, morbid obesity who was recently diagnosed with dementia, began to have increased hallucinations,  progressive confusion and drowsy     In the ER patient is found to be drowsy with ABG showing hypercarbic respiratory failure.  Patient was placed on BiPAP.  CT head was unremarkable    PT Comments    The patient is much more alert, follows commands well. The patient  Was ambulated on RA x 100' with RW. Oxygen saturation 86%. Ambulated x 300' on 2 liters  With sats > 92%. HR in 80's. Patient's wife wants [facility] and Not SNF.  Follow Up Recommendations  Home health PT     Equipment Recommendations  (tub bench)    Recommendations for Other Services       Precautions / Restrictions Precautions Precautions: Fall Precaution Comments: monitor sats Restrictions Weight Bearing Restrictions: No    Mobility  Bed Mobility Overal bed mobility: Needs Assistance Bed Mobility: Supine to Sit;Sit to Supine     Supine to sit: Mod assist Sit to supine: Mod assist   General bed mobility comments: use of bed rail, assist with trunk  then with legs onto bed.  Transfers Overall transfer level: Needs assistance Equipment used: Rolling walker (2 wheeled) Transfers: Sit to/from Stand Sit to Stand: Min assist         General transfer comment: cues for safety, to  slow down  Ambulation/Gait Ambulation/Gait assistance: Min assist;+2 safety/equipment Ambulation Distance (Feet): 400 Feet Assistive device: Rolling walker (2 wheeled) Gait Pattern/deviations: Step-through pattern     General Gait Details: gait is fast and steady with the RW,  The patient ambulated on and off the oxygen.    Stairs            Wheelchair Mobility    Modified  Rankin (Stroke Patients Only)       Balance Overall balance assessment: Needs assistance   Sitting balance-Leahy Scale: Good     Standing balance support: During functional activity;Bilateral upper extremity supported Standing balance-Leahy Scale: Fair                              Cognition Arousal/Alertness: Awake/alert Behavior During Therapy: WFL for tasks assessed/performed Overall Cognitive Status: History of cognitive impairments - at baseline                                 General Comments: patient is much more  able to follow commands and participate.      Exercises      General Comments        Pertinent Vitals/Pain Pain Assessment: No/denies pain    Home Living                      Prior Function            PT Goals (current goals can now be found in the care plan section) Progress towards PT goals: Progressing toward goals    Frequency    Min 3X/week      PT Plan Frequency needs to be updated;Discharge plan needs to be updated    Co-evaluation  AM-PAC PT "6 Clicks" Daily Activity  Outcome Measure  Difficulty turning over in bed (including adjusting bedclothes, sheets and blankets)?: A Lot Difficulty moving from lying on back to sitting on the side of the bed? : A Lot Difficulty sitting down on and standing up from a chair with arms (e.g., wheelchair, bedside commode, etc,.)?: A Little Help needed moving to and from a bed to chair (including a wheelchair)?: A Little Help needed walking in hospital room?: A Little Help needed climbing 3-5 steps with a railing? : A Lot 6 Click Score: 15    End of Session Equipment Utilized During Treatment: Gait belt;Oxygen Activity Tolerance: Patient tolerated treatment well Patient left: in bed;with call bell/phone within reach;with bed alarm set;with family/visitor present Nurse Communication: Mobility status;Need for lift equipment PT Visit  Diagnosis: Unsteadiness on feet (R26.81);Muscle weakness (generalized) (M62.81)     Time: 1225-1300 PT Time Calculation (min) (ACUTE ONLY): 35 min  Charges:  $Gait Training: 23-37 mins                    G CodesBlanchard Kelch:       Zooey Schreurs PT [phone]    Rada HayHill, Rosan Calbert Elizabeth 07/10/2017, 1:31 PM

## 2017-07-10 NOTE — Progress Notes (Signed)
[facility]                                                                              Patient Demographics  Trust Fernando [name], is a 72 y.o. male, DOB - May 21, 1946, VFI:[id number]  Admit date - 06/30/2017   Admitting Physician Jerald Kief, MD  Outpatient Primary MD for the patient is [facility]  Outpatient specialists:   LOS - 10  days   Medical records reviewed and are as summarized below:    Chief Complaint  Patient presents with  . Altered Mental Status       Brief summary   72 year old morbidly obese male with history of type 2 diabetes, diastolic CHF, hypertension, chronic kidney disease stage III, recently diagnosed with dementia within the past year on Aricept and Namenda, he was brought to the hospital on 06/30/2017 due to worsening mental status, increased hallucinations.  He was also confused and drowsy.  He was brought to the emergency room, he was found to have hypoxic and hypercarbic respiratory failure requiring BiPAP.  Chest x-ray showed evidence of a pneumonia and he was started on antibiotics.  Patient initially was very slow to progress, palliative care and pulmonology were consulted, however eventually improved and is able to tolerate a nightly BiPAP and nasal cannula during the day, and will be transferred to the floor on 2/9.  PT recommending SNF   Assessment & Plan    Acute on chronic respiratory failure with hypoxia and hypercarbia /community-acquired pneumonia -Multifactorial, likely has underlying OSA with obesity hypoventilation, PNA, fluid overload -Currently improving, overnight needs BiPAP. -Appreciate pulmonology recommendations, chronically hypercarbic and baseline, recommended stable regimen of NIPPV/BIPAP at night and during the day when he naps, per Dr Waynetta Pean note on 2/08. -Palliative care was consulted, signed off -Patient has completed Zithromax, ceftriaxone 7-day course - Blood cultures 1/2 showed currently  is negative staph, likely contaminant -Case management consult placed for home BiPAP or trilogy.  Patient and family declined skilled nursing facility and would like home health PT OT, home BiPAP. -Will need outpatient pulmonology referral or follow-up appointment.   Type 2 diabetes mellitus -CBGs stable, continue sliding scale insulin   Chronic kidney disease stage IV -Baseline creatinine 2-2.2 -Creatinine stable, 1.5 and improving, recheck BMET in a.m.  Acute on chronic diastolic CHF -Most recent 2D echo was done in June 2018 showed an EF of 55-60% -Improving, negative balance of 11.1 L, continue torsemide   Dementia -Progressive over the last year, continue home medications  Hypertension -Metoprolol was decreased on 2/6 due to intermittent hypotension and to allow diuresis -Continue current regimen, beta-blocker, torsemide  Metabolic encephalopathy secondary to hypercarbia -Currently close to his baseline mental status per wife at home.  Family prefers home health assistance, declined skilled nursing facility   Morbid obesity /deconditioning -PT evaluation recommended skilled nursing facility on 2/8 however family declining skilled nursing facility and would prefer home health at home  Morbid obesity BMI 40.68, counseled on diet and weight control   Code Status: DNR DVT Prophylaxis:  SCD's Family Communication: Discussed in detail with the patient, all imaging results, lab results explained to  the patient and wife at the bedside   Disposition Plan: Once home BiPAP is available  Time Spent in minutes   25 minutes  Procedures:  Bipap  Consultants:   Pulmonology  Antimicrobials:      Medications  Scheduled Meds: . allopurinol  100 mg Oral Q breakfast  . aspirin EC  81 mg Oral QODAY  . docusate sodium  100 mg Oral Daily  . docusate sodium  50 mg Oral QHS  . finasteride  5 mg Oral Q breakfast  . hydrocortisone cream   Topical BID  . insulin aspart   0-15 Units Subcutaneous TID WC  . insulin aspart  0-5 Units Subcutaneous QHS  . ipratropium-albuterol  3 mL Nebulization BID  . metoprolol tartrate  12.5 mg Oral BID  . terazosin  5 mg Oral Q supper  . torsemide  40 mg Oral Daily   Continuous Infusions: PRN Meds:.acetaminophen **OR** acetaminophen, hydrALAZINE, labetalol, ondansetron **OR** ondansetron (ZOFRAN) IV   Antibiotics   Anti-infectives (From admission, onward)   Start     Dose/Rate Route Frequency Ordered Stop   07/02/17 1030  azithromycin (ZITHROMAX) 500 mg in dextrose 5 % 250 mL IVPB     500 mg 250 mL/hr over 60 Minutes Intravenous Every 24 hours 07/02/17 0937 07/08/17 1150   07/02/17 1000  cefTRIAXone (ROCEPHIN) 1 g in dextrose 5 % 50 mL IVPB     1 g 100 mL/hr over 30 Minutes Intravenous Every 24 hours 07/02/17 0937 07/08/17 0940        Subjective:   Fernando [name] was seen and examined today.  Per wife, he did not have a good night, pulse oximeter kept beeping due to low sats.  Today Feels Fatigued and Sleepy. Patient denies dizziness, chest pain, abdominal pain, N/V/D/C, new weakness, numbess, tingling. No acute events overnight.    Objective:   Vitals:   07/09/17 2049 07/09/17 2123 07/10/17 0616 07/10/17 1230  BP: (!) 130/55  (!) 151/88   Pulse: (!) 57 62 (!) 54   Resp: 18 18 18    Temp: 98.4 F (36.9 C)  98.5 F (36.9 C)   TempSrc: Oral  Axillary   SpO2: 94% 98% 96% 95%  Weight:      Height:        Intake/Output Summary (Last 24 hours) at 07/10/2017 1329 Last data filed at 07/10/2017 1000 Gross per 24 hour  Intake 120 ml  Output 750 ml  Net -630 ml     Wt Readings from Last 3 Encounters:  07/09/17 (!) 136.1 kg (300 lb 0.7 oz)  05/11/17 (!) 142.4 kg (314 lb)  02/06/17 (!) 138.8 kg (306 lb)     Exam   General: Alert and oriented x 3, NAD  Eyes:   HEENT:    Cardiovascular: S1 S2 auscultated, RRR. No pedal edema b/l  Respiratory: Decreased breath sound  Gastrointestinal: Obese,  soft, nontender, nondistended, + bowel sounds  Ext: no pedal edema bilaterally  Neuro: no neuro deficit  Musculoskeletal: No digital cyanosis, clubbing  Skin: No rashes  Psych: Normal affect and demeanor, alert and oriented x3    Data Reviewed:  I have personally reviewed following labs and imaging studies  Micro Results Recent Results (from the past 240 hour(s))  Respiratory Panel by PCR     Status: None   Collection Time: 07/02/17 10:01 AM  Result Value Ref Range Status   Adenovirus NOT DETECTED NOT DETECTED Final   Coronavirus 229E NOT DETECTED NOT DETECTED Final  Coronavirus HKU1 NOT DETECTED NOT DETECTED Final   Coronavirus NL63 NOT DETECTED NOT DETECTED Final   Coronavirus OC43 NOT DETECTED NOT DETECTED Final   Metapneumovirus NOT DETECTED NOT DETECTED Final   Rhinovirus / Enterovirus NOT DETECTED NOT DETECTED Final   Influenza A NOT DETECTED NOT DETECTED Final   Influenza B NOT DETECTED NOT DETECTED Final   Parainfluenza Virus 1 NOT DETECTED NOT DETECTED Final   Parainfluenza Virus 2 NOT DETECTED NOT DETECTED Final   Parainfluenza Virus 3 NOT DETECTED NOT DETECTED Final   Parainfluenza Virus 4 NOT DETECTED NOT DETECTED Final   Respiratory Syncytial Virus NOT DETECTED NOT DETECTED Final   Bordetella pertussis NOT DETECTED NOT DETECTED Final   Chlamydophila pneumoniae NOT DETECTED NOT DETECTED Final   Mycoplasma pneumoniae NOT DETECTED NOT DETECTED Final    Comment: Performed at [facility], [address] [place], [place] [address]  Culture, blood (routine x 2) Call MD if unable to obtain prior to antibiotics being given     Status: None   Collection Time: 07/02/17 10:25 AM  Result Value Ref Range Status   Specimen Description BLOOD RIGHT ARM  Final   Special Requests IN PEDIATRIC BOTTLE Blood Culture adequate volume  Final   Culture   Final    NO GROWTH 5 DAYS Performed at [facility], [address] [place], [place] [address]    Report Status  07/07/2017 FINAL  Final  Culture, blood (routine x 2) Call MD if unable to obtain prior to antibiotics being given     Status: Abnormal   Collection Time: 07/02/17 10:40 AM  Result Value Ref Range Status   Specimen Description BLOOD RIGHT FOREARM  Final   Special Requests IN PEDIATRIC BOTTLE Blood Culture adequate volume  Final   Culture  Setup Time   Final    GRAM POSITIVE COCCI IN CLUSTERS IN PEDIATRIC BOTTLE CRITICAL RESULT CALLED TO, READ BACK BY AND VERIFIED WITH: PHARMD D ZEIGLER [phone] MLM    Culture (A)  Final    STAPHYLOCOCCUS SPECIES (COAGULASE NEGATIVE) THE SIGNIFICANCE OF ISOLATING THIS ORGANISM FROM A SINGLE SET OF BLOOD CULTURES WHEN MULTIPLE SETS ARE DRAWN IS UNCERTAIN. PLEASE NOTIFY THE MICROBIOLOGY DEPARTMENT WITHIN ONE WEEK IF SPECIATION AND SENSITIVITIES ARE REQUIRED. Performed at [facility], [address] [place], [place] [address]    Report Status 07/04/2017 FINAL  Final  Blood Culture ID Panel (Reflexed)     Status: Abnormal   Collection Time: 07/02/17 10:40 AM  Result Value Ref Range Status   Enterococcus species NOT DETECTED NOT DETECTED Final   Listeria monocytogenes NOT DETECTED NOT DETECTED Final   Staphylococcus species DETECTED (A) NOT DETECTED Final    Comment: Methicillin (oxacillin) susceptible coagulase negative staphylococcus. Possible blood culture contaminant (unless isolated from more than one blood culture draw or clinical case suggests pathogenicity). No antibiotic treatment is indicated for blood  culture contaminants. CRITICAL RESULT CALLED TO, READ BACK BY AND VERIFIED WITH: PHARMD D ZEIGLER [phone] MLM    Staphylococcus aureus NOT DETECTED NOT DETECTED Final   Methicillin resistance NOT DETECTED NOT DETECTED Final   Streptococcus species NOT DETECTED NOT DETECTED Final   Streptococcus agalactiae NOT DETECTED NOT DETECTED Final   Streptococcus pneumoniae NOT DETECTED NOT DETECTED Final   Streptococcus pyogenes NOT DETECTED  NOT DETECTED Final   Acinetobacter baumannii NOT DETECTED NOT DETECTED Final   Enterobacteriaceae species NOT DETECTED NOT DETECTED Final   Enterobacter cloacae complex NOT DETECTED  NOT DETECTED Final   Escherichia coli NOT DETECTED NOT DETECTED Final   Klebsiella oxytoca NOT DETECTED NOT DETECTED Final   Klebsiella pneumoniae NOT DETECTED NOT DETECTED Final   Proteus species NOT DETECTED NOT DETECTED Final   Serratia marcescens NOT DETECTED NOT DETECTED Final   Haemophilus influenzae NOT DETECTED NOT DETECTED Final   Neisseria meningitidis NOT DETECTED NOT DETECTED Final   Pseudomonas aeruginosa NOT DETECTED NOT DETECTED Final   Candida albicans NOT DETECTED NOT DETECTED Final   Candida glabrata NOT DETECTED NOT DETECTED Final   Candida krusei NOT DETECTED NOT DETECTED Final   Candida parapsilosis NOT DETECTED NOT DETECTED Final   Candida tropicalis NOT DETECTED NOT DETECTED Final    Comment: Performed at [facility], [address], [place], [place] [address]  MRSA PCR Screening     Status: None   Collection Time: 07/02/17  4:25 PM  Result Value Ref Range Status   MRSA by PCR NEGATIVE NEGATIVE Final    Comment:        The GeneXpert MRSA Assay (FDA approved for NASAL specimens only), is one component of a comprehensive MRSA colonization surveillance program. It is not intended to diagnose MRSA infection nor to guide or monitor treatment for MRSA infections. Performed at [facility], [address], [place], [place] [address]     Radiology Reports Dg Chest 2 View  Result Date: 06/30/2017 CLINICAL DATA:  Altered mental status.  Hypoxia. EXAM: CHEST  2 VIEW COMPARISON:  01/24/2017 FINDINGS: Low lung volumes. Cardiomegaly. Bibasilar scarring or subsegmental atelectasis. No definite consolidation or edema. No pneumothorax. Bones grossly unremarkable. IMPRESSION: Low lung volumes. No definite active infiltrates or failure. Cardiomegaly. Electronically  Signed   By: Elsie Stain M.D.   On: 06/30/2017 14:22   Ct Head Wo Contrast  Result Date: 06/30/2017 CLINICAL DATA:  Wife verbalizes pt "not acting like self; seeing things that aren't there; and asking if we are home when we are already there" since yesterday afternoon around 2. Pt denies weakness, numbness, or pain. Dementia. EXAM: CT HEAD WITHOUT CONTRAST TECHNIQUE: Contiguous axial images were obtained from the base of the skull through the vertex without intravenous contrast. COMPARISON:  07/16/2009 FINDINGS: Brain: There is moderate central and cortical atrophy. Periventricular white matter changes are consistent with small vessel disease. There is no intra or extra-axial fluid collection or mass lesion. The basilar cisterns and ventricles have a normal appearance. There is no CT evidence for acute infarction or hemorrhage. Vascular: No hyperdense vessel or unexpected calcification. Skull: Normal. Negative for fracture or focal lesion. Sinuses/Orbits: No acute finding. Other: None IMPRESSION: 1. Atrophy and small vessel disease. 2.  No evidence for acute intracranial abnormality. Electronically Signed   By: Norva Pavlov M.D.   On: 06/30/2017 16:32   Dg Chest Port 1 View  Result Date: 07/02/2017 CLINICAL DATA:  Fever EXAM: PORTABLE CHEST 1 VIEW COMPARISON:  06/30/2017; 01/24/2017 FINDINGS: Grossly unchanged enlarged cardiac silhouette and mediastinal contours given persistently reduced lung volumes. Worsening bibasilar heterogeneous/consolidative opacities, left greater than right. Trace left-sided effusion is not excluded. Pulmonary vasculature appears less distinct than present examination with cephalization of flow. No definite pneumothorax. No acute osseus abnormalities. IMPRESSION: 1. Worsening bibasilar heterogeneous/consolidative opacities, left greater than right, atelectasis versus infiltrate. Further evaluation with a PA and lateral chest radiograph may be obtained as clinically indicated.  2. Suspected mild pulmonary venous congestion with potential development of a trace left-sided effusion. Electronically Signed   By: Jonny Ruiz  Watts M.D.   On: 07/02/2017 09:07    Lab Data:  CBC: Recent Labs  Lab 07/06/17 0320 07/07/17 0252 07/09/17 0435  WBC 6.5 5.6 6.3  HGB 9.3* 9.2* 9.7*  HCT 33.5* 32.4* 34.0*  MCV 84.4 84.6 82.9  PLT 195 189 233   Basic Metabolic Panel: Recent Labs  Lab 07/04/17 0753 07/05/17 0257 07/06/17 0320 07/07/17 0252 07/09/17 0435  NA 142 142 141 141 139  K 4.0 3.8 3.5 3.5 3.5  CL 96* 95* 94* 94* 90*  CO2 36* 37* 37* 39* 39*  GLUCOSE 117* 99 117* 112* 126*  BUN 35* 37* 33* 30* 28*  CREATININE 2.17* 2.00* 1.79* 1.63* 1.51*  CALCIUM 8.5* 8.7* 8.5* 8.6* 8.9   GFR: Estimated Creatinine Clearance: 63.2 mL/min (A) (by C-G formula based on SCr of 1.51 mg/dL (H)). Liver Function Tests: No results for input(s): AST, ALT, ALKPHOS, BILITOT, PROT, ALBUMIN in the last 168 hours. No results for input(s): LIPASE, AMYLASE in the last 168 hours. No results for input(s): AMMONIA in the last 168 hours. Coagulation Profile: No results for input(s): INR, PROTIME in the last 168 hours. Cardiac Enzymes: No results for input(s): CKTOTAL, CKMB, CKMBINDEX, TROPONINI in the last 168 hours. BNP (last 3 results) No results for input(s): PROBNP in the last 8760 hours. HbA1C: No results for input(s): HGBA1C in the last 72 hours. CBG: Recent Labs  Lab 07/09/17 1144 07/09/17 1639 07/09/17 2056 07/10/17 0727 07/10/17 1204  GLUCAP 139* 135* 153* 122* 134*   Lipid Profile: No results for input(s): CHOL, HDL, LDLCALC, TRIG, CHOLHDL, LDLDIRECT in the last 72 hours. Thyroid Function Tests: No results for input(s): TSH, T4TOTAL, FREET4, T3FREE, THYROIDAB in the last 72 hours. Anemia Panel: No results for input(s): VITAMINB12, FOLATE, FERRITIN, TIBC, IRON, RETICCTPCT in the last 72 hours. Urine analysis:    Component Value Date/Time   COLORURINE YELLOW 07/01/2017  0012   APPEARANCEUR CLEAR 07/01/2017 0012   LABSPEC 1.010 07/01/2017 0012   PHURINE 5.0 07/01/2017 0012   GLUCOSEU 50 (A) 07/01/2017 0012   HGBUR NEGATIVE 07/01/2017 0012   BILIRUBINUR NEGATIVE 07/01/2017 0012   KETONESUR NEGATIVE 07/01/2017 0012   PROTEINUR 100 (A) 07/01/2017 0012   UROBILINOGEN 0.2 07/15/2009 2249   NITRITE NEGATIVE 07/01/2017 0012   LEUKOCYTESUR NEGATIVE 07/01/2017 0012     Ripudeep Rai M.D. [facility] 07/10/2017, 1:29 PM  Pager: [phone] Between 7am to 7pm - call Pager - [phone]  After 7pm go to [email or web address] - password [email or web address]  Call night coverage person covering after 7pm

## 2017-07-11 LAB — BASIC METABOLIC PANEL
ANION GAP: 14 (ref 5–15)
BUN: 25 mg/dL — ABNORMAL HIGH (ref 6–20)
CHLORIDE: 89 mmol/L — AB (ref 101–111)
CO2: 36 mmol/L — ABNORMAL HIGH (ref 22–32)
Calcium: 9 mg/dL (ref 8.9–10.3)
Creatinine, Ser: 1.58 mg/dL — ABNORMAL HIGH (ref 0.61–1.24)
GFR calc non Af Amer: 42 mL/min — ABNORMAL LOW (ref >60)
GFR, EST AFRICAN AMERICAN: 49 mL/min — AB (ref >60)
GLUCOSE: 113 mg/dL — AB (ref 65–99)
POTASSIUM: 3.2 mmol/L — AB (ref 3.5–5.1)
Sodium: 139 mmol/L (ref 135–145)

## 2017-07-11 LAB — GLUCOSE, CAPILLARY
Glucose-Capillary: 101 mg/dL — ABNORMAL HIGH (ref 65–99)
Glucose-Capillary: 114 mg/dL — ABNORMAL HIGH (ref 65–99)

## 2017-07-11 MED ORDER — IPRATROPIUM-ALBUTEROL 0.5-2.5 (3) MG/3ML IN SOLN: 3.0000 mL | RESPIRATORY_TRACT | Status: DC | PRN

## 2017-07-11 MED ORDER — ALBUTEROL SULFATE HFA 108 (90 BASE) MCG/ACT IN AERS: 2.0000 | INHALATION_SPRAY | Freq: Four times a day (QID) | RESPIRATORY_TRACT | 2 refills | Status: AC | PRN

## 2017-07-11 NOTE — Progress Notes (Signed)
Spoke with pt, his wife and niece Paula ComptonKarla, RN concerning [facility], BiPAP, and home O2. Explained again to the three for [facility] It was also explained to the three by Clydie BraunKaren, RN with [facility] the process of insurance paying for BiPAP and qualifications. HHRN/PT/OT was ordered and DME Home O2, BiPAP and other DME. Paula ComptonKarla and pt's wife will make an appointment to follow up after discharge with pulmonary and PCP. All questions answered by CM and [facility] rep.

## 2017-07-11 NOTE — Progress Notes (Signed)
Medications administered by student RN [phone] with supervision of Clinical Instructor Orvil FeilAbbie Ardith Lewman MSN, RN-BC or patient's assigned RN.

## 2017-07-11 NOTE — Plan of Care (Signed)
  Progressing Health Behavior/Discharge Planning: Ability to manage health-related needs will improve 07/11/2017 0038 - Progressing by Cristela FeltSteffens, Deidrea Gaetz P, RN Clinical Measurements: Ability to maintain clinical measurements within normal limits will improve 07/11/2017 0038 - Progressing by Cristela FeltSteffens, Ariana Juul P, RN Will remain free from infection 07/11/2017 0038 - Progressing by Cristela FeltSteffens, Luzmaria Devaux P, RN Diagnostic test results will improve 07/11/2017 0038 - Progressing by Cristela FeltSteffens, Kavon Valenza P, RN Respiratory complications will improve 07/11/2017 0038 - Progressing by Cristela FeltSteffens, Zaiya Annunziato P, RN Cardiovascular complication will be avoided 07/11/2017 0038 - Progressing by Cristela FeltSteffens, Brinn Westby P, RN Activity: Risk for activity intolerance will decrease 07/11/2017 0038 - Progressing by Cristela FeltSteffens, Jermarion Poffenberger P, RN Elimination: Will not experience complications related to bowel motility 07/11/2017 0038 - Progressing by Cristela FeltSteffens, Ski Polich P, RN Will not experience complications related to urinary retention 07/11/2017 0038 - Progressing by Cristela FeltSteffens, Ailsa Mireles P, RN

## 2017-07-11 NOTE — Discharge Summary (Signed)
Physician Discharge Summary  CZAR YSAGUIRRE ZOX:[id number] DOB: Oct 25, 1945 DOA: 06/30/2017  PCP: [facility]  Admit date: 06/30/2017 Discharge date: 07/11/2017  Admitted From: home Disposition:  Home with HHPT, RRT  Recommendations for Outpatient Follow-up:  1. Follow up with PCP in 1-2 weeks 2. Discharged with home BiPAP at [facility] 3. Follow up with Pulmonary as an outpatient   Home Health: PT, RRT Equipment/Devices: BiPAP  Discharge Condition: stable CODE STATUS: DNR Diet recommendation: low calorie  HPI: Per Dr. Gilford Silvius, Fernando [name] is a 72 y.o. male with history of diabetes mellitus type 2, CHF, hypertension, chronic kidney disease, morbid obesity who was recently diagnosed with dementia and was placed on Aricept 4 months ago and was recently started on Namenda last month started experiencing increasing hallucinations yesterday afternoon.  Following which patient became progressively confused and drowsy. This afternoon since patient's symptoms did not improve was brought to the ER. ED Course: In the ER patient is found to be drowsy with ABG showing hypercarbic respiratory failure.  Patient was placed on BiPAP.  CT head was unremarkable.  Patient is afebrile.  UA does not show any signs of infection.  Chest x-ray was unremarkable.  Patient as per the patient's wife was at the bedside is DNR.  Patient admitted for further observation.  Hospital Course: Acute on chronic respiratory failure with hypoxia and hypercarbia /community-acquired pneumonia -Multifactorial, likely has underlying OSA with obesity hypoventilation, PNA, fluid overload.  Patient was initially admitted to stepdown requiring BiPAP.  Pulmonary was consulted and has followed patient while hospitalized.  His BiPAP was initially continuous, and that was transitioned, as patient started to improve, to [facility] only.  He has remained stable, was transferred to the floor on 2/9 and has remained stable on the  floor.  Palliative care was also consulted and has followed patient, he is DNR.  For his community-acquired pneumonia he has completed a full 7-day course of ceftriaxone and azithromycin.  Blood cultures showed 1/2 coag negative staph, likely contaminant.  Care management was consulted and have been worked to establish patient with home BiPAP, this was arranged prior to discharge. Type 2 diabetes mellitus -continue home medications Chronic kidney disease stage IV -Baseline creatinine 2-2.2, creatinine stable around 1.6 on discharge, follow-up with nephrology as an outpatient Acute on chronic diastolic CHF -Most recent 2D echo was done in June 2018 showed an EF of 55-60%.  He responded well to his home torsemide, net -11.4 L on discharge, discussion today with patient and his wife at bedside regarding low-salt diet Dementia -Progressive over the last year, continue home medications Hypertension -stable, continue home medications Metabolic encephalopathy secondary to hypercarbia -improved, mental status at baseline on discharge Morbid obesity/deconditioning -PT evaluation recommended skilled nursing facility on 2/8 however family declining skilled nursing facility and would prefer home health at home.  Counseled regarding weight loss  Discharge Diagnoses:  Active Problems:   HTN (hypertension)   Morbid obesity (HCC)   Acute respiratory failure with hypoxia and hypercapnia (HCC)   CKD (chronic kidney disease) stage 3, GFR 30-59 ml/min (HCC)   Dementia   Acute encephalopathy   Type 2 diabetes mellitus with renal complication (HCC)   Acute respiratory failure with hypoxia and hypercarbia (HCC)   Hypercapnic respiratory failure (HCC)     Discharge Instructions   Allergies as of 07/11/2017      Reactions   Ibuprofen Other (See Comments)   Due to one kidney      Medication List  TAKE these medications   albuterol 108 (90 Base) MCG/ACT inhaler Commonly known as:  PROVENTIL HFA;VENTOLIN  HFA Inhale 2 puffs into the lungs every 6 (six) hours as needed for wheezing or shortness of breath.   allopurinol 100 MG tablet Commonly known as:  ZYLOPRIM Take 100 mg by mouth daily with breakfast.   aspirin EC 81 MG tablet Take 81 mg by mouth every other day.   cholecalciferol 1000 units tablet Commonly known as:  VITAMIN D Take 1,000 Units by mouth daily with breakfast.   docusate sodium 50 MG capsule Commonly known as:  COLACE Take 50-100 mg by mouth 2 (two) times daily. 2 capsules in am and 1 capsule at night   donepezil 10 MG tablet Commonly known as:  ARICEPT Take 10 mg by mouth daily with breakfast.   finasteride 5 MG tablet Commonly known as:  PROSCAR Take 5 mg by mouth daily with breakfast.   glipiZIDE 5 MG tablet Commonly known as:  GLUCOTROL Take 5 mg by mouth 2 (two) times daily before a meal.   magnesium oxide 400 MG tablet Commonly known as:  MAG-OX Take 400 mg by mouth every Monday, Wednesday, and Friday.   metoprolol tartrate 50 MG tablet Commonly known as:  LOPRESSOR Take 1 tablet (50 mg total) by mouth 2 (two) times daily.   terazosin 5 MG capsule Commonly known as:  HYTRIN Take 5 mg by mouth daily with supper.   torsemide 20 MG tablet Commonly known as:  DEMADEX Take 40 mg (2 Tablet in the AM ) and 20 mg ( 1 Tablet in the PM)   vitamin B-12 1000 MCG tablet Commonly known as:  CYANOCOBALAMIN Take 1,000 mcg by mouth daily with supper.            Durable Medical Equipment  (From admission, onward)        Start     Ordered   07/11/17 1314  For home use only DME oxygen  Once    Question Answer Comment  Mode or (Route) Nasal cannula   Liters per Minute 3   Frequency Continuous (stationary and portable oxygen unit needed)   Oxygen conserving device Yes   Oxygen delivery system Gas      07/11/17 1313   07/11/17 1137  For home use only DME Tub bench  Once     07/11/17 1137   07/11/17 1136  For home use only DME Bedside commode  Once     Question:  Patient needs a bedside commode to treat with the following condition  Answer:  Fear for personal safety   07/11/17 1136   07/11/17 1043  For home use only DME Bipap  Once    Question Answer Comment  Bleed in oxygen (LPM) 3   Keep 02 saturation > 88%   Inspiratory pressure 15   Expiratory pressure 5      07/11/17 1042     Follow-up Information    Mannam, Praveen, MD. Schedule an appointment as soon as possible for a visit in 2 week(s).   Specialty:  Pulmonary Disease Contact information: [address] [place] [id number] [phone]          Consultations:  PCCM  Procedures/Studies:  Dg Chest 2 View  Result Date: 06/30/2017 CLINICAL DATA:  Altered mental status.  Hypoxia. EXAM: CHEST  2 VIEW COMPARISON:  01/24/2017 FINDINGS: Low lung volumes. Cardiomegaly. Bibasilar scarring or subsegmental atelectasis. No definite consolidation or edema. No pneumothorax. Bones grossly unremarkable.  IMPRESSION: Low lung volumes. No definite active infiltrates or failure. Cardiomegaly. Electronically Signed   By: Elsie Stain M.D.   On: 06/30/2017 14:22   Ct Head Wo Contrast  Result Date: 06/30/2017 CLINICAL DATA:  Wife verbalizes pt "not acting like self; seeing things that aren't there; and asking if we are home when we are already there" since yesterday afternoon around 2. Pt denies weakness, numbness, or pain. Dementia. EXAM: CT HEAD WITHOUT CONTRAST TECHNIQUE: Contiguous axial images were obtained from the base of the skull through the vertex without intravenous contrast. COMPARISON:  07/16/2009 FINDINGS: Brain: There is moderate central and cortical atrophy. Periventricular white matter changes are consistent with small vessel disease. There is no intra or extra-axial fluid collection or mass lesion. The basilar cisterns and ventricles have a normal appearance. There is no CT evidence for acute infarction or hemorrhage. Vascular: No hyperdense vessel or unexpected  calcification. Skull: Normal. Negative for fracture or focal lesion. Sinuses/Orbits: No acute finding. Other: None IMPRESSION: 1. Atrophy and small vessel disease. 2.  No evidence for acute intracranial abnormality. Electronically Signed   By: Norva Pavlov M.D.   On: 06/30/2017 16:32   Dg Chest Port 1 View  Result Date: 07/02/2017 CLINICAL DATA:  Fever EXAM: PORTABLE CHEST 1 VIEW COMPARISON:  06/30/2017; 01/24/2017 FINDINGS: Grossly unchanged enlarged cardiac silhouette and mediastinal contours given persistently reduced lung volumes. Worsening bibasilar heterogeneous/consolidative opacities, left greater than right. Trace left-sided effusion is not excluded. Pulmonary vasculature appears less distinct than present examination with cephalization of flow. No definite pneumothorax. No acute osseus abnormalities. IMPRESSION: 1. Worsening bibasilar heterogeneous/consolidative opacities, left greater than right, atelectasis versus infiltrate. Further evaluation with a PA and lateral chest radiograph may be obtained as clinically indicated. 2. Suspected mild pulmonary venous congestion with potential development of a trace left-sided effusion. Electronically Signed   By: Simonne Come M.D.   On: 07/02/2017 09:07      Subjective: - no chest pain, shortness of breath, no abdominal pain, nausea or vomiting.   Discharge Exam: Vitals:   07/11/17 1057 07/11/17 1400  BP: 113/71 (!) 122/53  Pulse: (!) 56 74  Resp:  18  Temp:    SpO2:  97%    General: Pt is alert, awake, not in acute distress Cardiovascular: RRR, S1/S2 +, no rubs, no gallops Respiratory: CTA bilaterally, no wheezing, no rhonchi Abdominal: Soft, NT, ND, bowel sounds + Extremities: no edema, no cyanosis    The results of significant diagnostics from this hospitalization (including imaging, microbiology, ancillary and laboratory) are listed below for reference.     Microbiology: Recent Results (from the past 240 hour(s))    Respiratory Panel by PCR     Status: None   Collection Time: 07/02/17 10:01 AM  Result Value Ref Range Status   Adenovirus NOT DETECTED NOT DETECTED Final   Coronavirus 229E NOT DETECTED NOT DETECTED Final   Coronavirus HKU1 NOT DETECTED NOT DETECTED Final   Coronavirus NL63 NOT DETECTED NOT DETECTED Final   Coronavirus OC43 NOT DETECTED NOT DETECTED Final   Metapneumovirus NOT DETECTED NOT DETECTED Final   Rhinovirus / Enterovirus NOT DETECTED NOT DETECTED Final   Influenza A NOT DETECTED NOT DETECTED Final   Influenza B NOT DETECTED NOT DETECTED Final   Parainfluenza Virus 1 NOT DETECTED NOT DETECTED Final   Parainfluenza Virus 2 NOT DETECTED NOT DETECTED Final   Parainfluenza Virus 3 NOT DETECTED NOT DETECTED Final   Parainfluenza Virus 4 NOT DETECTED NOT DETECTED Final   Respiratory  Syncytial Virus NOT DETECTED NOT DETECTED Final   Bordetella pertussis NOT DETECTED NOT DETECTED Final   Chlamydophila pneumoniae NOT DETECTED NOT DETECTED Final   Mycoplasma pneumoniae NOT DETECTED NOT DETECTED Final    Comment: Performed at [facility], [address], [place], [place] [address]  Culture, blood (routine x 2) Call MD if unable to obtain prior to antibiotics being given     Status: None   Collection Time: 07/02/17 10:25 AM  Result Value Ref Range Status   Specimen Description BLOOD RIGHT ARM  Final   Special Requests IN PEDIATRIC BOTTLE Blood Culture adequate volume  Final   Culture   Final    NO GROWTH 5 DAYS Performed at [facility], [address], [place], [place] [address]    Report Status 07/07/2017 FINAL  Final  Culture, blood (routine x 2) Call MD if unable to obtain prior to antibiotics being given     Status: Abnormal   Collection Time: 07/02/17 10:40 AM  Result Value Ref Range Status   Specimen Description BLOOD RIGHT FOREARM  Final   Special Requests IN PEDIATRIC BOTTLE Blood Culture adequate volume  Final   Culture  Setup Time   Final    GRAM POSITIVE  COCCI IN CLUSTERS IN PEDIATRIC BOTTLE CRITICAL RESULT CALLED TO, READ BACK BY AND VERIFIED WITH: PHARMD D ZEIGLER [id number] MLM    Culture (A)  Final    STAPHYLOCOCCUS SPECIES (COAGULASE NEGATIVE) THE SIGNIFICANCE OF ISOLATING THIS ORGANISM FROM A SINGLE SET OF BLOOD CULTURES WHEN MULTIPLE SETS ARE DRAWN IS UNCERTAIN. PLEASE NOTIFY THE MICROBIOLOGY DEPARTMENT WITHIN ONE WEEK IF SPECIATION AND SENSITIVITIES ARE REQUIRED. Performed at [facility], [address], [place], [place] [address]    Report Status 07/04/2017 FINAL  Final  Blood Culture ID Panel (Reflexed)     Status: Abnormal   Collection Time: 07/02/17 10:40 AM  Result Value Ref Range Status   Enterococcus species NOT DETECTED NOT DETECTED Final   Listeria monocytogenes NOT DETECTED NOT DETECTED Final   Staphylococcus species DETECTED (A) NOT DETECTED Final    Comment: Methicillin (oxacillin) susceptible coagulase negative staphylococcus. Possible blood culture contaminant (unless isolated from more than one blood culture draw or clinical case suggests pathogenicity). No antibiotic treatment is indicated for blood  culture contaminants. CRITICAL RESULT CALLED TO, READ BACK BY AND VERIFIED WITH: PHARMD D ZEIGLER [id number] MLM    Staphylococcus aureus NOT DETECTED NOT DETECTED Final   Methicillin resistance NOT DETECTED NOT DETECTED Final   Streptococcus species NOT DETECTED NOT DETECTED Final   Streptococcus agalactiae NOT DETECTED NOT DETECTED Final   Streptococcus pneumoniae NOT DETECTED NOT DETECTED Final   Streptococcus pyogenes NOT DETECTED NOT DETECTED Final   Acinetobacter baumannii NOT DETECTED NOT DETECTED Final   Enterobacteriaceae species NOT DETECTED NOT DETECTED Final   Enterobacter cloacae complex NOT DETECTED NOT DETECTED Final   Escherichia coli NOT DETECTED NOT DETECTED Final   Klebsiella oxytoca NOT DETECTED NOT DETECTED Final   Klebsiella pneumoniae NOT DETECTED NOT DETECTED Final   Proteus  species NOT DETECTED NOT DETECTED Final   Serratia marcescens NOT DETECTED NOT DETECTED Final   Haemophilus influenzae NOT DETECTED NOT DETECTED Final   Neisseria meningitidis NOT DETECTED NOT DETECTED Final   Pseudomonas aeruginosa NOT DETECTED NOT DETECTED Final   Candida albicans NOT DETECTED NOT DETECTED Final   Candida glabrata NOT DETECTED NOT DETECTED Final   Candida krusei NOT DETECTED NOT DETECTED Final  Candida parapsilosis NOT DETECTED NOT DETECTED Final   Candida tropicalis NOT DETECTED NOT DETECTED Final    Comment: Performed at [facility], [address] [place], [place] [address]  MRSA PCR Screening     Status: None   Collection Time: 07/02/17  4:25 PM  Result Value Ref Range Status   MRSA by PCR NEGATIVE NEGATIVE Final    Comment:        The GeneXpert MRSA Assay (FDA approved for NASAL specimens only), is one component of a comprehensive MRSA colonization surveillance program. It is not intended to diagnose MRSA infection nor to guide or monitor treatment for MRSA infections. Performed at [facility], [address], [place], [place] [address]      Labs: BNP (last 3 results) Recent Labs    11/09/16 0859 11/18/16 1340 01/20/17 1410  BNP 93.3 23.3 104.0*   Basic Metabolic Panel: Recent Labs  Lab 07/05/17 0257 07/06/17 0320 07/07/17 0252 07/09/17 0435 07/11/17 0427  NA 142 141 141 139 139  K 3.8 3.5 3.5 3.5 3.2*  CL 95* 94* 94* 90* 89*  CO2 37* 37* 39* 39* 36*  GLUCOSE 99 117* 112* 126* 113*  BUN 37* 33* 30* 28* 25*  CREATININE 2.00* 1.79* 1.63* 1.51* 1.58*  CALCIUM 8.7* 8.5* 8.6* 8.9 9.0   Liver Function Tests: No results for input(s): AST, ALT, ALKPHOS, BILITOT, PROT, ALBUMIN in the last 168 hours. No results for input(s): LIPASE, AMYLASE in the last 168 hours. No results for input(s): AMMONIA in the last 168 hours. CBC: Recent Labs  Lab 07/06/17 0320 07/07/17 0252 07/09/17 0435  WBC 6.5 5.6 6.3  HGB 9.3*  9.2* 9.7*  HCT 33.5* 32.4* 34.0*  MCV 84.4 84.6 82.9  PLT 195 189 233   Cardiac Enzymes: No results for input(s): CKTOTAL, CKMB, CKMBINDEX, TROPONINI in the last 168 hours. BNP: Invalid input(s): POCBNP CBG: Recent Labs  Lab 07/10/17 1204 07/10/17 1716 07/10/17 2140 07/11/17 0738 07/11/17 1302  GLUCAP 134* 130* 119* 101* 114*   D-Dimer No results for input(s): DDIMER in the last 72 hours. Hgb A1c No results for input(s): HGBA1C in the last 72 hours. Lipid Profile No results for input(s): CHOL, HDL, LDLCALC, TRIG, CHOLHDL, LDLDIRECT in the last 72 hours. Thyroid function studies No results for input(s): TSH, T4TOTAL, T3FREE, THYROIDAB in the last 72 hours.  Invalid input(s): FREET3 Anemia work up No results for input(s): VITAMINB12, FOLATE, FERRITIN, TIBC, IRON, RETICCTPCT in the last 72 hours. Urinalysis    Component Value Date/Time   COLORURINE YELLOW 07/01/2017 0012   APPEARANCEUR CLEAR 07/01/2017 0012   LABSPEC 1.010 07/01/2017 0012   PHURINE 5.0 07/01/2017 0012   GLUCOSEU 50 (A) 07/01/2017 0012   HGBUR NEGATIVE 07/01/2017 0012   BILIRUBINUR NEGATIVE 07/01/2017 0012   KETONESUR NEGATIVE 07/01/2017 0012   PROTEINUR 100 (A) 07/01/2017 0012   UROBILINOGEN 0.2 07/15/2009 2249   NITRITE NEGATIVE 07/01/2017 0012   LEUKOCYTESUR NEGATIVE 07/01/2017 0012   Sepsis Labs Invalid input(s): PROCALCITONIN,  WBC,  LACTICIDVEN   Time coordinating discharge: 40 minutes  SIGNED:  Pamella Pert, MD  [facility] 07/11/2017, 4:35 PM Pager [phone]  If 7PM-7AM, please contact night-coverage [email or web address] Password [email or web address]

## 2017-07-11 NOTE — Care Management Note (Signed)
Case Management Note  Patient Details  Name: Fernando GeninRichard A [name] MRN: [id number] Date of Birth: 1945/10/31  Subjective/Objective:    Acute Resp Failure                 [facility]   Expected Discharge Date:                  Expected Discharge Plan:  Home w Home Health Services  In-House Referral:     Discharge planning Services  CM Consult  Post Acute Care Choice:    Choice offered to:  Spouse  DME Arranged:  Bedside commode, Tub bench, Bipap, Oxygen DME Agency:  [facility]  HH [facility]  [facility]  [facility]  Status of Service:  Completed, signed off  If discussed at Long Length of Stay Meetings, dates discussed:    Additional CommentsGeni [name]:  Fernando Tofte, RN 07/11/2017, 12:30 PM

## 2017-07-11 NOTE — Discharge Instructions (Signed)
Follow with Dr. Kristopher OppenheimManem with Pulmonary in 1-2 weeks  Please get a complete blood count and chemistry panel checked by your Primary MD at your next visit, and again as instructed by your Primary MD. Please get your medications reviewed and adjusted by your Primary MD.  Please request your Primary MD to go over all Hospital Tests and Procedure/Radiological results at the follow up, please get all Hospital records sent to your Prim MD by signing hospital release before you go home.  If you had Pneumonia of Lung problems at the Hospital: Please get a 2 view Chest X ray done in 6-8 weeks after hospital discharge or sooner if instructed by your Primary MD.  If you have Congestive Heart Failure: Please call your Cardiologist or Primary MD anytime you have any of the following symptoms:  1) 3 pound weight gain in 24 hours or 5 pounds in 1 week  2) shortness of breath, with or without a dry hacking cough  3) swelling in the hands, feet or stomach  4) if you have to sleep on extra pillows at night in order to breathe  Follow cardiac low salt diet and 1.5 lit/day fluid restriction.  If you have diabetes Accuchecks 4 times/day, Once in AM empty stomach and then before each meal. Log in all results and show them to your primary doctor at your next visit. If any glucose reading is under 80 or above 300 call your primary MD immediately.  If you have Seizure/Convulsions/Epilepsy: Please do not drive, operate heavy machinery, participate in activities at heights or participate in high speed sports until you have seen by Primary MD or a Neurologist and advised to do so again.  If you had Gastrointestinal Bleeding: Please ask your Primary MD to check a complete blood count within one week of discharge or at your next visit. Your endoscopic/colonoscopic biopsies that are pending at the time of discharge, will also need to followed by your Primary MD.  Get Medicines reviewed and adjusted. Please take all your  medications with you for your next visit with your Primary MD  Please request your Primary MD to go over all hospital tests and procedure/radiological results at the follow up, please ask your Primary MD to get all Hospital records sent to his/her office.  If you experience worsening of your admission symptoms, develop shortness of breath, life threatening emergency, suicidal or homicidal thoughts you must seek medical attention immediately by calling 911 or calling your MD immediately  if symptoms less severe.  You must read complete instructions/literature along with all the possible adverse reactions/side effects for all the Medicines you take and that have been prescribed to you. Take any new Medicines after you have completely understood and accpet all the possible adverse reactions/side effects.   Do not drive or operate heavy machinery when taking Pain medications.   Do not take more than prescribed Pain, Sleep and Anxiety Medications  Special Instructions: If you have smoked or chewed Tobacco  in the last 2 yrs please stop smoking, stop any regular Alcohol  and or any Recreational drug use.  Wear Seat belts while driving.  Please note You were cared for by a hospitalist during your hospital stay. If you have any questions about your discharge medications or the care you received while you were in the hospital after you are discharged, you can call the unit and asked to speak with the hospitalist on call if the hospitalist that took care of you is not available.  Once you are discharged, your primary care physician will handle any further medical issues. Please note that NO REFILLS for any discharge medications will be authorized once you are discharged, as it is imperative that you return to your primary care physician (or establish a relationship with a primary care physician if you do not have one) for your aftercare needs so that they can reassess your need for medications and monitor your  lab values.  You can reach the hospitalist office at phone [phone] or fax [phone]   If you do not have a primary care physician, you can call [phone] for a physician referral.  Activity: As tolerated with Full fall precautions use walker/cane & assistance as needed  Diet: low calorie  Disposition Home

## 2017-07-11 NOTE — Progress Notes (Signed)
SATURATION QUALIFICATIONS: (This note is used to comply with regulatory documentation for home oxygen)  Patient Saturations on Room Air at Rest = 90%  Patient Saturations on Room Air while Ambulating = 84%  Patient Saturations on 3 Liters of oxygen while Ambulating = 95%  Please briefly explain why patient needs home oxygen:

## 2017-07-12 DIAGNOSIS — J9622 Acute and chronic respiratory failure with hypercapnia: Secondary | ICD-10-CM | POA: Diagnosis not present

## 2017-07-12 DIAGNOSIS — N184 Chronic kidney disease, stage 4 (severe): Secondary | ICD-10-CM | POA: Diagnosis not present

## 2017-07-12 DIAGNOSIS — Z7984 Long term (current) use of oral hypoglycemic drugs: Secondary | ICD-10-CM | POA: Diagnosis not present

## 2017-07-12 DIAGNOSIS — G4733 Obstructive sleep apnea (adult) (pediatric): Secondary | ICD-10-CM | POA: Diagnosis not present

## 2017-07-12 DIAGNOSIS — E662 Morbid (severe) obesity with alveolar hypoventilation: Secondary | ICD-10-CM | POA: Diagnosis not present

## 2017-07-12 DIAGNOSIS — Z6841 Body Mass Index (BMI) 40.0 and over, adult: Secondary | ICD-10-CM | POA: Diagnosis not present

## 2017-07-12 DIAGNOSIS — R0602 Shortness of breath: Secondary | ICD-10-CM | POA: Diagnosis not present

## 2017-07-12 DIAGNOSIS — Z87891 Personal history of nicotine dependence: Secondary | ICD-10-CM | POA: Diagnosis not present

## 2017-07-12 DIAGNOSIS — Z9981 Dependence on supplemental oxygen: Secondary | ICD-10-CM | POA: Diagnosis not present

## 2017-07-12 DIAGNOSIS — I130 Hypertensive heart and chronic kidney disease with heart failure and stage 1 through stage 4 chronic kidney disease, or unspecified chronic kidney disease: Secondary | ICD-10-CM | POA: Diagnosis not present

## 2017-07-12 DIAGNOSIS — I5031 Acute diastolic (congestive) heart failure: Secondary | ICD-10-CM | POA: Diagnosis not present

## 2017-07-12 DIAGNOSIS — I5033 Acute on chronic diastolic (congestive) heart failure: Secondary | ICD-10-CM | POA: Diagnosis not present

## 2017-07-12 DIAGNOSIS — F0390 Unspecified dementia without behavioral disturbance: Secondary | ICD-10-CM | POA: Diagnosis not present

## 2017-07-12 DIAGNOSIS — J9621 Acute and chronic respiratory failure with hypoxia: Secondary | ICD-10-CM | POA: Diagnosis not present

## 2017-07-12 DIAGNOSIS — E1122 Type 2 diabetes mellitus with diabetic chronic kidney disease: Secondary | ICD-10-CM | POA: Diagnosis not present

## 2017-07-12 DIAGNOSIS — Z905 Acquired absence of kidney: Secondary | ICD-10-CM | POA: Diagnosis not present

## 2017-07-18 ENCOUNTER — Encounter: Payer: Self-pay | Admitting: Adult Health

## 2017-07-18 ENCOUNTER — Ambulatory Visit (INDEPENDENT_AMBULATORY_CARE_PROVIDER_SITE_OTHER)
Admission: RE | Admit: 2017-07-18 | Discharge: 2017-07-18 | Disposition: A | Discharge status: Home / Self Care | Payer: Medicare Other | Source: Ambulatory Visit | Attending: Adult Health | Admitting: Adult Health

## 2017-07-18 ENCOUNTER — Other Ambulatory Visit: Payer: Self-pay | Admitting: Adult Health

## 2017-07-18 ENCOUNTER — Ambulatory Visit: Payer: Medicare Other | Admitting: Adult Health

## 2017-07-18 ENCOUNTER — Telehealth: Payer: Self-pay | Admitting: Adult Health

## 2017-07-18 VITALS — BP 120/82 | HR 63 | Ht 72.0 in | Wt 298.0 lb

## 2017-07-18 DIAGNOSIS — J189 Pneumonia, unspecified organism: Secondary | ICD-10-CM

## 2017-07-18 DIAGNOSIS — J9601 Acute respiratory failure with hypoxia: Secondary | ICD-10-CM

## 2017-07-18 DIAGNOSIS — F0280 Dementia in other diseases classified elsewhere without behavioral disturbance: Secondary | ICD-10-CM | POA: Diagnosis not present

## 2017-07-18 DIAGNOSIS — I5033 Acute on chronic diastolic (congestive) heart failure: Secondary | ICD-10-CM | POA: Diagnosis not present

## 2017-07-18 DIAGNOSIS — G309 Alzheimer's disease, unspecified: Secondary | ICD-10-CM | POA: Diagnosis not present

## 2017-07-18 DIAGNOSIS — J9692 Respiratory failure, unspecified with hypercapnia: Secondary | ICD-10-CM | POA: Diagnosis not present

## 2017-07-18 DIAGNOSIS — J9602 Acute respiratory failure with hypercapnia: Secondary | ICD-10-CM

## 2017-07-18 NOTE — Telephone Encounter (Signed)
If OSA is ruled out, will have to qualify the patient as they have obesity hypoventilation syndrome, which they will need to be qualified through [facility].

## 2017-07-18 NOTE — Assessment & Plan Note (Signed)
Recent decompensation , seems to be improved but has chronic edema.  Difficult to manage as has CKD   Cont on current regimen  Low salt diet  Legs elevated as able  Cont on O2 .

## 2017-07-18 NOTE — Assessment & Plan Note (Signed)
Recent admission , finished abx course  Check cxr today

## 2017-07-18 NOTE — Patient Instructions (Addendum)
Chest xray today .  Try to wear BIPAP At bedtime  And with naps if able.  Continue on Oxygen 2l/m .  Refer to Hospice .  Follow up Dr. Isaiah SergeMannam in 2  weeks and As needed   Please contact office for sooner follow up if symptoms do not improve or worsen or seek emergency care

## 2017-07-18 NOTE — Progress Notes (Signed)
Was able to talk to the patient and wife regarding their results.  They verbalized an understanding of what was discussed. No further questions at this time.

## 2017-07-18 NOTE — Telephone Encounter (Signed)
Called Barbara CowerJason, unable to reach left message to give us a call back regarding patient.

## 2017-07-18 NOTE — Progress Notes (Signed)
PMH: former smoker quit in 1972, CHF, HTN and Dementia.    72 y/o F s/p hospitalization from 06/30/17 to 07/11/17 for acute on chronic respiratory failure, CAP, and hypercapnic respiratory failure.  Pt was d/c on 07/11/17 home on BIPAP.  Pt does not like wearing his BIPAP according to wife Fernando [name].   Wearing the BIPAP nightly has been a hit or miss, averaging 1 to 3 hours use per night.  Has been on oxygen 3 L [place] 24/7 since d/c from hospital and has been tolerating this well with oxygen saturation ranging from 92% to 100%.  Denies: fever, chest pain, cough, or wheezing. Endorses great appetite and few episodes of SOB at night while not wearing BIPAP.  Wife is requesting other mask options today.  Also, have qestions about the upcoming sleep study.HPI information obtained from wife Fernando [name].     PE: Gen: Appears chronically ill wearing oxygen and obese HEENT: atraumatic/normacephalic, no lesions, no post nasal drip Neck: Supple, no JVD Cardiovascular: rhythm regular, 3+ bilateral lower extremities edema, no murmurs heard Lungs: no use of accessory muscles, diminished in the bases Abd: obese, soft, non-tender, normal BS Neuro: alert, no tremors Psych: flat affect

## 2017-07-18 NOTE — Progress Notes (Signed)
@Patient  ID: Fernando [name], male    DOB: 1946/02/21, 72 y.o.   MRN: [id number]  Chief Complaint  Patient presents with  . Follow-up    PNA     Referring provider: [facility]  HPI: 72 year old male former smoker seen for pulmonary consult during hospitalization February 2019 for acute on chronic hypercarbic and hypoxic respiratory failure with suspected pneumonia and decompensated diastolic heart failure  07/18/2017 Follow up : Chronic Hypercarbic /Hypoxic RF on O2 and BIPAP , PNA  Pt returns for follow up for recent hospitalization .  Patient is accompanied by his wife and son.  Patient was recently admitted to the hospital for acute on chronic hypercarbic respiratory failure along with community-acquired pneumonia and decompensated diastolic heart failure.. Patient required  BiPAP support.  He was treated with IV antibiotics and diuresis.  Patient was seen by palliative care  Patient has significant dementia.  His medical condition is complicated by chronic kidney disease stage IV.Marland Kitchen During hospitalization patient has significant metabolic encephalopathy felt secondary to hypercarbia. He was discharged on BIPAP due to significant hypercarbia . Since discharge pt says he is doing good, he is confused, does not remember being in hospital. Most of info is from wife. Says he is doing okay , dementia has progressive over last year . He has not been wearing BIPAP due to confusion, gets agitated and wants to take of the mask a lot . She says it is very hard to get him to wear it . Says he gets winded easily .  Has a sore on bridge on nose . No fever, chest pain, or n/v. Has chronic edema . He sleeps in recliner and does not like feet elevated , hurts his hips.    Allergies  Allergen Reactions  . Ibuprofen Other (See Comments)    Due to one kidney    Immunization History  Administered Date(s) Administered  . Influenza, High Dose Seasonal PF 07/03/2017    Past Medical  History:  Diagnosis Date  . Arthritis   . CHF (congestive heart failure) (HCC)   . Dementia   . Diabetes mellitus without complication (HCC)   . Hypertension   . Renal disorder     Tobacco History: Social History   Tobacco Use  Smoking Status Former Smoker  . Last attempt to quit: 05/30/1970  . Years since quitting: 47.1  Smokeless Tobacco Never Used   Counseling given: Not Answered   Outpatient Encounter Medications as of 07/18/2017  Medication Sig  . albuterol (PROVENTIL HFA;VENTOLIN HFA) 108 (90 Base) MCG/ACT inhaler Inhale 2 puffs into the lungs every 6 (six) hours as needed for wheezing or shortness of breath.  . allopurinol (ZYLOPRIM) 100 MG tablet Take 100 mg by mouth daily with breakfast.  . aspirin EC 81 MG tablet Take 81 mg by mouth every other day.  . cholecalciferol (VITAMIN D) 1000 units tablet Take 1,000 Units by mouth daily with breakfast.   . docusate sodium (COLACE) 50 MG capsule Take 50-100 mg by mouth 2 (two) times daily. 2 capsules in am and 1 capsule at night  . donepezil (ARICEPT) 10 MG tablet Take 10 mg by mouth daily with breakfast.   . glipiZIDE (GLUCOTROL) 5 MG tablet Take 5 mg by mouth 2 (two) times daily before a meal.  . magnesium oxide (MAG-OX) 400 MG tablet Take 400 mg by mouth every Monday, Wednesday, and Friday.   . metoprolol tartrate (LOPRESSOR) 50 MG tablet Take 1 tablet (50 mg total)  by mouth 2 (two) times daily.  Marland Kitchen. terazosin (HYTRIN) 5 MG capsule Take 5 mg by mouth daily with supper.   . torsemide (DEMADEX) 20 MG tablet Take 40 mg (2 Tablet in the AM ) and 20 mg ( 1 Tablet in the PM)  . vitamin B-12 (CYANOCOBALAMIN) 1000 MCG tablet Take 1,000 mcg by mouth daily with supper.   . [DISCONTINUED] finasteride (PROSCAR) 5 MG tablet Take 5 mg by mouth daily with breakfast.    No facility-administered encounter medications on file as of 07/18/2017.      Review of Systems  Constitutional:   No  weight loss, night sweats,  Fevers, chills, +  fatigue, or  lassitude.  HEENT:   No headaches,  Difficulty swallowing,  Tooth/dental problems, or  Sore throat,                No sneezing, itching, ear ache, nasal congestion, post nasal drip,   CV:  No chest pain,  Orthopnea, PND, ++swelling in lower extremities, No  anasarca, dizziness, palpitations, syncope.   GI  No heartburn, indigestion, abdominal pain, nausea, vomiting, diarrhea, change in bowel habits, loss of appetite, bloody stools.   Resp: .  No chest wall deformity  Skin: no rash or lesions.  GU: no dysuria, change in color of urine, no urgency or frequency.  No flank pain, no hematuria   MS:  No joint pain or swelling.  No decreased range of motion.  No back pain.    Physical Exam  BP 120/82 (BP Location: Right Arm, Cuff Size: Normal)   Pulse 63   Ht 6' (1.829 m)   Wt 298 lb (135.2 kg)   SpO2 100%   BMI 40.42 kg/m   GEN: A/Ox3; pleasant , NAD, morbid obesity on O2    HEENT:  [place]/AT,  EACs-clear, TMs-wnl, NOSE-clear, THROAT-clear, no lesions, no postnasal drip or exudate noted.   NECK:  Supple w/ fair ROM; no JVD; normal carotid impulses w/o bruits; no thyromegaly or nodules palpated; no lymphadenopathy.    RESP  Decreased BS in bases  w/o, wheezes/ rales/ or rhonchi. no accessory muscle use, no dullness to percussion  CARD:  RRR, no m/r/g, 2+ peripheral edema, pulses intact, no cyanosis or clubbing.  GI:   Soft & nt; nml bowel sounds; no organomegaly or masses detected.   Musco: Warm bil, no deformities or joint swelling noted.   Neuro: alert, no focal deficits noted.    Skin: Warm, no lesions or rashes    Lab Results:  CBC    Component Value Date/Time   WBC 6.3 07/09/2017 0435   RBC 4.10 (L) 07/09/2017 0435   HGB 9.7 (L) 07/09/2017 0435   HCT 34.0 (L) 07/09/2017 0435   PLT 233 07/09/2017 0435   MCV 82.9 07/09/2017 0435   MCH 23.7 (L) 07/09/2017 0435   MCHC 28.5 (L) 07/09/2017 0435   RDW 16.0 (H) 07/09/2017 0435   LYMPHSABS 1.3 05/11/2017  1620   MONOABS 1.1 (H) 05/11/2017 1620   EOSABS 0.1 05/11/2017 1620   BASOSABS 0.0 05/11/2017 1620    BMET    Component Value Date/Time   NA 139 07/11/2017 0427   K 3.2 (L) 07/11/2017 0427   CL 89 (L) 07/11/2017 0427   CO2 36 (H) 07/11/2017 0427   GLUCOSE 113 (H) 07/11/2017 0427   BUN 25 (H) 07/11/2017 0427   CREATININE 1.58 (H) 07/11/2017 0427   CALCIUM 9.0 07/11/2017 0427   GFRNONAA 42 (L) 07/11/2017 0427  GFRAA 49 (L) 07/11/2017 0427    BNP    Component Value Date/Time   BNP 104.0 (H) 01/20/2017 1410    ProBNP No results found for: PROBNP  Imaging: Dg Chest 2 View  Result Date: 07/18/2017 CLINICAL DATA:  Pneumonia EXAM: CHEST  2 VIEW COMPARISON:  07/02/2017 FINDINGS: Lingular airspace opacity favoring pneumonia. Bandlike opacity favoring atelectasis at the right lung base. Low lung volumes are present, causing crowding of the pulmonary vasculature. Atherosclerotic calcification of the aortic arch. Thoracic spondylosis. No pleural effusion. IMPRESSION: 1. Lingular airspace opacity compatible with pneumonia 2. Atelectasis along the right hemidiaphragm. 3. Low lung volumes are present, causing crowding of the pulmonary vasculature. 4.  Aortic Atherosclerosis (ICD10-I70.0). Electronically Signed   By: Gaylyn Rong M.D.   On: 07/18/2017 13:24   Dg Chest 2 View  Result Date: 06/30/2017 CLINICAL DATA:  Altered mental status.  Hypoxia. EXAM: CHEST  2 VIEW COMPARISON:  01/24/2017 FINDINGS: Low lung volumes. Cardiomegaly. Bibasilar scarring or subsegmental atelectasis. No definite consolidation or edema. No pneumothorax. Bones grossly unremarkable. IMPRESSION: Low lung volumes. No definite active infiltrates or failure. Cardiomegaly. Electronically Signed   By: Elsie Stain M.D.   On: 06/30/2017 14:22   Ct Head Wo Contrast  Result Date: 06/30/2017 CLINICAL DATA:  Wife verbalizes pt "not acting like self; seeing things that aren't there; and asking if we are home when we are  already there" since yesterday afternoon around 2. Pt denies weakness, numbness, or pain. Dementia. EXAM: CT HEAD WITHOUT CONTRAST TECHNIQUE: Contiguous axial images were obtained from the base of the skull through the vertex without intravenous contrast. COMPARISON:  07/16/2009 FINDINGS: Brain: There is moderate central and cortical atrophy. Periventricular white matter changes are consistent with small vessel disease. There is no intra or extra-axial fluid collection or mass lesion. The basilar cisterns and ventricles have a normal appearance. There is no CT evidence for acute infarction or hemorrhage. Vascular: No hyperdense vessel or unexpected calcification. Skull: Normal. Negative for fracture or focal lesion. Sinuses/Orbits: No acute finding. Other: None IMPRESSION: 1. Atrophy and small vessel disease. 2.  No evidence for acute intracranial abnormality. Electronically Signed   By: Norva Pavlov M.D.   On: 06/30/2017 16:32   Dg Chest Port 1 View  Result Date: 07/02/2017 CLINICAL DATA:  Fever EXAM: PORTABLE CHEST 1 VIEW COMPARISON:  06/30/2017; 01/24/2017 FINDINGS: Grossly unchanged enlarged cardiac silhouette and mediastinal contours given persistently reduced lung volumes. Worsening bibasilar heterogeneous/consolidative opacities, left greater than right. Trace left-sided effusion is not excluded. Pulmonary vasculature appears less distinct than present examination with cephalization of flow. No definite pneumothorax. No acute osseus abnormalities. IMPRESSION: 1. Worsening bibasilar heterogeneous/consolidative opacities, left greater than right, atelectasis versus infiltrate. Further evaluation with a PA and lateral chest radiograph may be obtained as clinically indicated. 2. Suspected mild pulmonary venous congestion with potential development of a trace left-sided effusion. Electronically Signed   By: Simonne Come M.D.   On: 07/02/2017 09:07     Assessment & Plan:   Acute on chronic diastolic CHF  (congestive heart failure) (HCC) Recent decompensation , seems to be improved but has chronic edema.  Difficult to manage as has CKD   Cont on current regimen  Low salt diet  Legs elevated as able  Cont on O2 .   Acute respiratory failure with hypoxia and hypercapnia (HCC) Recent admission with progressive hypercarbia - pt had associated CAP , volume overload along with OHS .  He is having a hard time tolerating BIPAP  due to his dementia .  Will try new mask - dream wear full face. This will help with nasal bridge sore . Advised wife to use for short intervals if he can tolerate.  Pt may not be able to tolerate BIPAP .  Long discussion with wife. Pt is most lkely not able to go for sleep study due to dementia. Pallative care consult was requested in hospital but was not completed per wife. They have decided on DNR status . We discussed this and DNR /paperwork completed.  Discussed palliative /hospice referral. Pt has moderate/ possible severe dementia along with multiple comorbities including hypoxic/hypercarbia Resp Failure and CKD   Plan  Patient Instructions  Chest xray today .  Try to wear BIPAP At bedtime  And with naps if able.  Continue on Oxygen 2l/m .  Refer to Hospice .  Follow up Dr. Isaiah Serge in 2  weeks and As needed   Please contact office for sooner follow up if symptoms do not improve or worsen or seek emergency care         Pneumonia Recent admission , finished abx course  Check cxr today       Rubye Oaks, NP 07/18/2017

## 2017-07-18 NOTE — Assessment & Plan Note (Signed)
Recent admission with progressive hypercarbia - pt had associated CAP , volume overload along with OHS .  He is having a hard time tolerating BIPAP due to his dementia .  Will try new mask - dream wear full face. This will help with nasal bridge sore . Advised wife to use for short intervals if he can tolerate.  Pt may not be able to tolerate BIPAP .  Long discussion with wife. Pt is most lkely not able to go for sleep study due to dementia. Pallative care consult was requested in hospital but was not completed per wife. They have decided on DNR status . We discussed this and DNR /paperwork completed.  Discussed palliative /hospice referral. Pt has moderate/ possible severe dementia along with multiple comorbities including hypoxic/hypercarbia Resp Failure and CKD   Plan  Patient Instructions  Chest xray today .  Try to wear BIPAP At bedtime  And with naps if able.  Continue on Oxygen 2l/m .  Refer to Hospice .  Follow up Dr. Isaiah SergeMannam in 2  weeks and As needed   Please contact office for sooner follow up if symptoms do not improve or worsen or seek emergency care

## 2017-07-19 ENCOUNTER — Telehealth: Payer: Self-pay | Admitting: Adult Health

## 2017-07-19 NOTE — Telephone Encounter (Signed)
Called and spoke to [place] with Hospice. pt's family was under the impression that pt would only go under hospice care if pt was not wearing bipap mask. Ann states per pt's family, pt has received a new mask and is doing well with it. Dewayne Hatchnn states pt's family is not ready for hospice at this time. Dewayne Hatchnn feels that palliative care would be an option for pt. [facility]   TP please advise if okay to place palliative care referral. Thanks

## 2017-07-19 NOTE — Telephone Encounter (Signed)
[facility] aware of TP's recs.  Nothing further needed.

## 2017-07-19 NOTE — Telephone Encounter (Signed)
Per TP: sorry, in conversation that was had with pt's spouse yesterday she said she wanted to proceed with Hospice.  But okay to change to Palliative Care and okay to order PT.  If at any time they would like a referral to Hospice, will be happy to help.  Thanks.

## 2017-07-19 NOTE — Telephone Encounter (Signed)
lmtcb x2 for pt. 

## 2017-07-20 NOTE — Telephone Encounter (Signed)
Ok, will need to let wife know he needs sleep study .  He already has the bipap though

## 2017-07-20 NOTE — Telephone Encounter (Signed)
[facility] needs to undergo sleep study before they can provide a bipap.  TP please advise if ok to order sleep study.  Thanks!

## 2017-07-20 NOTE — Telephone Encounter (Signed)
lmtcb x1 for pt's wife. 

## 2017-07-21 ENCOUNTER — Telehealth: Payer: Self-pay | Admitting: Adult Health

## 2017-07-21 NOTE — Telephone Encounter (Signed)
[facility] Mar[facility]ue IvanLiz who went out to pt's home to see pt.  Marisue IvanLiz stated that pt has not been using BIPAP that much (only used it for about 5 min today). Pt has been a little more confused than usual.  Marisue IvanLiz stated that hospice came out to see pt but stated that they would not take over care as long as pt is still trying to use his BIPAP.  Will route this over to Jess and TP for an FYI.

## 2017-07-21 NOTE — Telephone Encounter (Signed)
lmtcb x2 for pt's wife. 

## 2017-07-21 NOTE — Telephone Encounter (Signed)
Per TP: okay, thank you.

## 2017-07-24 NOTE — Telephone Encounter (Signed)
Jason is calling back [phone] ext#[phone] 

## 2017-07-24 NOTE — Telephone Encounter (Signed)
Pt's wife Leta JunglingMarcia is calling back  [phone]

## 2017-07-24 NOTE — Telephone Encounter (Addendum)
LM for Barbara CowerJason to call back. Left a detailed message.  LM for Leta JunglingMarcia to call back. I am not sure why she called, maybe to give us more information.

## 2017-07-24 NOTE — Telephone Encounter (Signed)
Spoke with pt's wife, Leta JunglingMarcia. She is aware that he will need a new sleep study. States that pt is not really using his BiPAP, it's "hit or miss" with him using BiPAP. They do not want to have a new sleep study done.  lmtcb x1 [facility] [place] [facility].

## 2017-07-24 NOTE — Telephone Encounter (Signed)
Called and spoke with patients wife, she states that she is having a hard time with patient and him deciding on the sleep study. She has asked that she speak directly to TP nurse. Will route this to JJ so that she can answers some of the patients questions.

## 2017-07-25 DIAGNOSIS — R531 Weakness: Secondary | ICD-10-CM | POA: Diagnosis not present

## 2017-07-26 NOTE — Telephone Encounter (Signed)
Spoke to pt's spouse Leta JunglingMarcia. Leta JunglingMarcia stated that she does not feel that pt will follow through with bipap. Leta JunglingMarcia states that pt does not wish to proceed with sleep study.  Leta JunglingMarcia states that hospice did an eval and stated that palliative care would be best. Palliative care stated that hospice would be best. If pt is under hospice care, bipap will be covered under hospice.   TP please advise. Thanks.

## 2017-07-28 NOTE — Telephone Encounter (Signed)
Per TP: I spoke with patient's spouse about this at the visit extensively.  We figured patient would not do well with the BiPAP and that it may be time for Hospice.  She was agreeable with this at time of visit. Since patient is unable to tolerate the BiPAP, what does Fernando [name] want?  Would she like to proceed with Hospice?  We placed the referral to Palliative per family's request per the 2.20.19 phone note.  Please call and speak with spouse.  Thank you.

## 2017-07-31 NOTE — Telephone Encounter (Signed)
lmtcb for pt/spouse 

## 2017-08-01 ENCOUNTER — Encounter: Payer: Self-pay | Admitting: Adult Health

## 2017-08-01 ENCOUNTER — Ambulatory Visit (INDEPENDENT_AMBULATORY_CARE_PROVIDER_SITE_OTHER)
Admission: RE | Admit: 2017-08-01 | Discharge: 2017-08-01 | Disposition: A | Discharge status: Home / Self Care | Payer: Medicare Other | Source: Ambulatory Visit | Attending: Adult Health | Admitting: Adult Health

## 2017-08-01 ENCOUNTER — Other Ambulatory Visit (INDEPENDENT_AMBULATORY_CARE_PROVIDER_SITE_OTHER): Payer: Medicare Other

## 2017-08-01 ENCOUNTER — Ambulatory Visit: Payer: Medicare Other | Admitting: Adult Health

## 2017-08-01 ENCOUNTER — Telehealth: Payer: Self-pay | Admitting: Adult Health

## 2017-08-01 VITALS — BP 116/74 | HR 62 | Ht 72.0 in | Wt 294.7 lb

## 2017-08-01 DIAGNOSIS — J9692 Respiratory failure, unspecified with hypercapnia: Secondary | ICD-10-CM | POA: Diagnosis not present

## 2017-08-01 DIAGNOSIS — J189 Pneumonia, unspecified organism: Secondary | ICD-10-CM

## 2017-08-01 DIAGNOSIS — J9612 Chronic respiratory failure with hypercapnia: Secondary | ICD-10-CM | POA: Diagnosis not present

## 2017-08-01 DIAGNOSIS — J9610 Chronic respiratory failure, unspecified whether with hypoxia or hypercapnia: Secondary | ICD-10-CM | POA: Insufficient documentation

## 2017-08-01 DIAGNOSIS — J9611 Chronic respiratory failure with hypoxia: Secondary | ICD-10-CM | POA: Diagnosis not present

## 2017-08-01 DIAGNOSIS — N183 Chronic kidney disease, stage 3 unspecified: Secondary | ICD-10-CM

## 2017-08-01 DIAGNOSIS — F0280 Dementia in other diseases classified elsewhere without behavioral disturbance: Secondary | ICD-10-CM | POA: Diagnosis not present

## 2017-08-01 DIAGNOSIS — G309 Alzheimer's disease, unspecified: Secondary | ICD-10-CM | POA: Diagnosis not present

## 2017-08-01 LAB — BASIC METABOLIC PANEL
BUN: 33 mg/dL — ABNORMAL HIGH (ref 6–23)
CHLORIDE: 95 meq/L — AB (ref 96–112)
CO2: 36 meq/L — AB (ref 19–32)
Calcium: 9.8 mg/dL (ref 8.4–10.5)
Creatinine, Ser: 2.02 mg/dL — ABNORMAL HIGH (ref 0.40–1.50)
GFR: 34.61 mL/min — ABNORMAL LOW (ref >60.00)
Glucose, Bld: 99 mg/dL (ref 70–99)
Potassium: 3.7 mEq/L (ref 3.5–5.1)
Sodium: 137 mEq/L (ref 135–145)

## 2017-08-01 NOTE — Assessment & Plan Note (Signed)
Pt with progressive hypercarbic RF -patient needs to wear BiPAP with bedtime and at naps.  With patient's ongoing confusion with probable dementia and hypercarbia is very hard for patient's wife to get him to wear this.  If he is unable to go for a sleep study as recommended by his insurance company due to his confusion.  Have encouraged patient and wife to use as much as possible that he will allow.  Patient is to continue on oxygen for O2 saturations less than 88%. Discuss hospice referral with patient's wife.  Patient's wife is in agreement.  Plan  Patient Instructions  Try to wear BIPAP At bedtime  And with naps if able. And As needed  For increased confusion .  Continue on Oxygen 2l/m with activity .  Check chest xray and labs today .  Refer to Hospice .  Follow up Dr. Isaiah SergeMannam in 3 months and As needed   Please contact office for sooner follow up if symptoms do not improve or worsen or seek emergency care

## 2017-08-01 NOTE — Progress Notes (Signed)
@Patient  ID: Fernando [name], male    DOB: February 19, 1946, 72 y.o.   MRN: [id number]  Chief Complaint  Patient presents with  . Follow-up    PNA     Referring provider: [facility]  HPI: 72 year old male former smoker seen for pulmonary consult during hospitalization February 2019 for acute on chronic hypercarbic and hypoxic respiratory failure with suspected pneumonia and decompensated diastolic heart failure  08/01/2017 Follow up : Hypercarbic/Hypoxic RF on O2 , BIPAP intolerant and PNA /D CHF  Patient returns for a 2-week follow-up.  He was recently admitted last month for acute on chronic hypercarbic respiratory failure along with community acquired pneumonia and decompensated heart failure.  He was recommend to use BiPAP at home.  He was having great difficulties using BiPAP due to his underlying dementia and confusion.  She has multiple comorbidities including stage IV chronic kidney disease.  Wife tries to get him to wear BIPAP but he is very resistance. BIPAP helps when O2 sats are low and confusion gets worse. He will only wear for about an hour at at time. Insurance requires a sleep study and pt is not able to go for this due to confusion.  Long discussion with wife last visit pt has multiple medical problems and is progressively getting worse. He was recommended for palliative care consult last admission . This was done and pt has been evaluated and is planned for hospice . Reviewed this with wife today ,she is in agreement.     Allergies  Allergen Reactions  . Ibuprofen Other (See Comments)    Due to one kidney    Immunization History  Administered Date(s) Administered  . Influenza, High Dose Seasonal PF 07/03/2017    Past Medical History:  Diagnosis Date  . Arthritis   . CHF (congestive heart failure) (HCC)   . Dementia   . Diabetes mellitus without complication (HCC)   . Hypertension   . Renal disorder     Tobacco History: Social History   Tobacco  Use  Smoking Status Former Smoker  . Last attempt to quit: 05/30/1970  . Years since quitting: 47.2  Smokeless Tobacco Never Used   Counseling given: Not Answered   Outpatient Encounter Medications as of 08/01/2017  Medication Sig  . albuterol (PROVENTIL HFA;VENTOLIN HFA) 108 (90 Base) MCG/ACT inhaler Inhale 2 puffs into the lungs every 6 (six) hours as needed for wheezing or shortness of breath.  . allopurinol (ZYLOPRIM) 100 MG tablet Take 100 mg by mouth daily with breakfast.  . aspirin EC 81 MG tablet Take 81 mg by mouth every other day.  . cholecalciferol (VITAMIN D) 1000 units tablet Take 1,000 Units by mouth daily with breakfast.   . docusate sodium (COLACE) 50 MG capsule Take 50-100 mg by mouth 2 (two) times daily. 2 capsules in am and 1 capsule at night  . glipiZIDE (GLUCOTROL) 5 MG tablet Take 5 mg by mouth 2 (two) times daily before a meal.  . magnesium oxide (MAG-OX) 400 MG tablet Take 400 mg by mouth every Monday, Wednesday, and Friday.   . metoprolol tartrate (LOPRESSOR) 50 MG tablet Take 1 tablet (50 mg total) by mouth 2 (two) times daily.  Marland Kitchen terazosin (HYTRIN) 5 MG capsule Take 5 mg by mouth daily with supper.   . torsemide (DEMADEX) 20 MG tablet Take 40 mg (2 Tablet in the AM ) and 20 mg ( 1 Tablet in the PM)  . vitamin B-12 (CYANOCOBALAMIN) 1000 MCG tablet Take 1,000  mcg by mouth daily with supper.   . donepezil (ARICEPT) 10 MG tablet Take 10 mg by mouth daily with breakfast.    No facility-administered encounter medications on file as of 08/01/2017.      Review of Systems  Constitutional:   No  weight loss, night sweats,  Fevers, chills,  +fatigue, or  Lassitude. +confusion   HEENT:   No headaches,  Difficulty swallowing,  Tooth/dental problems, or  Sore throat,                No sneezing, itching, ear ache,  +nasal congestion, post nasal drip,   CV:  No chest pain,  Orthopnea, PND, swelling in lower extremities, anasarca, dizziness, palpitations, syncope.   GI  No  heartburn, indigestion, abdominal pain, nausea, vomiting, diarrhea, change in bowel habits, loss of appetite, bloody stools.   Resp:    No chest wall deformity  Skin: no rash or lesions.  GU: no dysuria, change in color of urine, no urgency or frequency.  No flank pain, no hematuria   MS:  No joint pain or swelling.  No decreased range of motion.  No back pain.    Physical Exam  BP 116/74 (BP Location: Left Arm, Cuff Size: Large)   Pulse 62   Ht 6' (1.829 m)   Wt 294 lb 11.2 oz (133.7 kg)   SpO2 91%   BMI 39.97 kg/m   GEN: A/Ox3; pleasant , NAD, elderly, obese    HEENT:  [place]/AT,  EACs-clear, TMs-wnl, NOSE-clear, THROAT-clear, no lesions, no postnasal drip or exudate noted.   NECK:  Supple w/ fair ROM; no JVD; normal carotid impulses w/o bruits; no thyromegaly or nodules palpated; no lymphadenopathy.    RESP  Clear  P & A; w/o, wheezes/ rales/ or rhonchi. no accessory muscle use, no dullness to percussion  CARD:  RRR, no m/r/g, 2+  peripheral edema, pulses intact, no cyanosis or clubbing.  GI:   Soft & nt; nml bowel sounds; no organomegaly or masses detected.   Musco: Warm bil, no deformities or joint swelling noted.   Neuro: alert, no focal deficits noted.  +confusion  , answers simple questions/commands, looks to wife for questions.   Skin: Warm, no lesions or rashes    Lab Results:  CBC    Component Value Date/Time   WBC 6.3 07/09/2017 0435   RBC 4.10 (L) 07/09/2017 0435   HGB 9.7 (L) 07/09/2017 0435   HCT 34.0 (L) 07/09/2017 0435   PLT 233 07/09/2017 0435   MCV 82.9 07/09/2017 0435   MCH 23.7 (L) 07/09/2017 0435   MCHC 28.5 (L) 07/09/2017 0435   RDW 16.0 (H) 07/09/2017 0435   LYMPHSABS 1.3 05/11/2017 1620   MONOABS 1.1 (H) 05/11/2017 1620   EOSABS 0.1 05/11/2017 1620   BASOSABS 0.0 05/11/2017 1620    BMET    Component Value Date/Time   NA 137 08/01/2017 0940   K 3.7 08/01/2017 0940   CL 95 (L) 08/01/2017 0940   CO2 36 (H) 08/01/2017 0940    GLUCOSE 99 08/01/2017 0940   BUN 33 (H) 08/01/2017 0940   CREATININE 2.02 (H) 08/01/2017 0940   CALCIUM 9.8 08/01/2017 0940   GFRNONAA 42 (L) 07/11/2017 0427   GFRAA 49 (L) 07/11/2017 0427    BNP    Component Value Date/Time   BNP 104.0 (H) 01/20/2017 1410    ProBNP No results found for: PROBNP  Imaging: Dg Chest 2 View  Result Date: 08/01/2017 CLINICAL DATA:  Follow-up pneumonia  EXAM: CHEST  2 VIEW COMPARISON:  07/18/2017 FINDINGS: Chronic cardiomegaly. Full appearance of the right hilum that is stable over multiple studies and attributed to vessels. There is linear opacities at the left base that are unchanged. Chronic low lung volumes. No edema, effusion, or pneumothorax. No acute osseous finding. IMPRESSION: Unchanged linear opacities at the left base compatible with atelectasis, scarring, or bronchopneumonia. Given these developed recently, additional follow-up in 6-8 weeks is recommended. Electronically Signed   By: Marnee Spring M.D.   On: 08/01/2017 10:04   Dg Chest 2 View  Result Date: 07/18/2017 CLINICAL DATA:  Pneumonia EXAM: CHEST  2 VIEW COMPARISON:  07/02/2017 FINDINGS: Lingular airspace opacity favoring pneumonia. Bandlike opacity favoring atelectasis at the right lung base. Low lung volumes are present, causing crowding of the pulmonary vasculature. Atherosclerotic calcification of the aortic arch. Thoracic spondylosis. No pleural effusion. IMPRESSION: 1. Lingular airspace opacity compatible with pneumonia 2. Atelectasis along the right hemidiaphragm. 3. Low lung volumes are present, causing crowding of the pulmonary vasculature. 4.  Aortic Atherosclerosis (ICD10-I70.0). Electronically Signed   By: Gaylyn Rong M.D.   On: 07/18/2017 13:24     Assessment & Plan:   CKD (chronic kidney disease) stage 3, GFR 30-59 ml/min (HCC) Check bmet today .   Chronic respiratory failure (HCC) Pt with progressive hypercarbic RF -patient needs to wear BiPAP with bedtime and at  naps.  With patient's ongoing confusion with probable dementia and hypercarbia is very hard for patient's wife to get him to wear this.  If he is unable to go for a sleep study as recommended by his insurance company due to his confusion.  Have encouraged patient and wife to use as much as possible that he will allow.  Patient is to continue on oxygen for O2 saturations less than 88%. Discuss hospice referral with patient's wife.  Patient's wife is in agreement.  Plan  Patient Instructions  Try to wear BIPAP At bedtime  And with naps if able. And As needed  For increased confusion .  Continue on Oxygen 2l/m with activity .  Check chest xray and labs today .  Refer to Hospice .  Follow up Dr. Isaiah Serge in 3 months and As needed   Please contact office for sooner follow up if symptoms do not improve or worsen or seek emergency care        Pneumonia Clinically improving.  Check chest x-ray today      Rubye Oaks, NP 08/01/2017

## 2017-08-01 NOTE — Assessment & Plan Note (Signed)
Check bmet today   

## 2017-08-01 NOTE — Telephone Encounter (Signed)
Please get hospice nurse number or have her call me please

## 2017-08-01 NOTE — Patient Instructions (Addendum)
Try to wear BIPAP At bedtime  And with naps if able. And As needed  For increased confusion .  Continue on Oxygen 2l/m with activity .  Check chest xray and labs today .  Refer to Hospice .  Follow up Dr. Isaiah SergeMannam in 3 months and As needed   Please contact office for sooner follow up if symptoms do not improve or worsen or seek emergency care

## 2017-08-01 NOTE — Assessment & Plan Note (Signed)
Clinically improving.  Check chest x-ray today

## 2017-08-01 NOTE — Telephone Encounter (Signed)
Called and spoke with patients wife, she states that she would like to have the Hospice care however she is in "total confusion" she was under the impression that if patient went under hospice care that they would rely solely on them. If patient goes with Hospice care will patient still need to get medications from [facility] or will they do it. She is under the impression that if patient goes with hospice they will have to lose contact with patients other doctors. She would like more information on this. TP please advise.

## 2017-08-01 NOTE — Telephone Encounter (Signed)
Will close encounter, as order has been placed to Hospice. Jenn with Hospice is aware. Nothing further is needed.

## 2017-08-01 NOTE — Telephone Encounter (Signed)
Pt was indeed seen today and Hospice was discussed The order has already been placed

## 2017-08-01 NOTE — Telephone Encounter (Signed)
Called and spoke with patients wife, she states the nurse who she spoke with was by the name of Candise BowensJen and her number was [phone]

## 2017-08-02 NOTE — Telephone Encounter (Signed)
TP spoke with Hospice nurse Amy who clarified that Hospice is calling to ask PM to be the Hospice attending physician.  Nothing else is needed regarding Hospice other than the attending physician.  Dr Isaiah SergeMannam please advise, thank you.

## 2017-08-02 NOTE — Telephone Encounter (Signed)
Nope.

## 2017-08-02 NOTE — Telephone Encounter (Signed)
Called number provided for Fernando [name] which was the referral line  I was advised that the best number to reach Fernando [name] is [phone] and you can call her anytime before 5 pm  Forwarding back to TP

## 2017-08-02 NOTE — Telephone Encounter (Signed)
TP please advise if you've spoken to hospice nurse Candise BowensJen.  Thanks!

## 2017-08-03 NOTE — Progress Notes (Signed)
Was able to talk to the patient regarding their results.  They verbalized an understanding of what was discussed. No further questions at this time. Wife read back all recommendations regarding medication and to arrive early on 3/14 for the follow up CXR

## 2017-08-03 NOTE — Progress Notes (Signed)
Was able to talk to the patient regarding their results.  They verbalized an understanding of what was discussed. No further questions at this time.  Order already in for follow up cxr for 3.14.19's visit

## 2017-08-04 NOTE — Telephone Encounter (Signed)
Does he have a PCP in the community? If not then I am ok as the attending physician.  Fernando GreathousePraveen Dallys Nowakowski MD [facility] Pager [phone] 08/04/2017, 2:28 PM

## 2017-08-04 NOTE — Telephone Encounter (Signed)
LM for Amy to return call to give her Dr. Shirlee MoreMannam's message.

## 2017-08-07 NOTE — Telephone Encounter (Signed)
Spoke with Amy, she states the pt does not have a PCP in the community. I asdvied her of Dr. Shirlee MoreMannam's message. Nothing further is needed. FYI Dr. Isaiah SergeMannam.

## 2017-08-10 ENCOUNTER — Telehealth: Payer: Self-pay | Admitting: Adult Health

## 2017-08-10 ENCOUNTER — Ambulatory Visit: Payer: Medicare Other | Admitting: Cardiology

## 2017-08-10 NOTE — Telephone Encounter (Signed)
Per TP: is he having any increased dyspnea?  Wheezing? Cough?  [facility]  Called spoke with patient who reported that she has not noticed any increased SOB/wheezing/cough/tightness in chest with patient.  She weighed him on Tuesday 3/12 and his was was 294.8, she apologized for not weighing him yesterday.  Patient's weight today was 297.4.  TP please advise, thank you.

## 2017-08-10 NOTE — Telephone Encounter (Signed)
Called and spoke with [facility], she states that patient has gone up 3 pounds since Tuesday. TP had decreased Furosamide to 20mg  in the morning and 20 mg in the evening. TP please advise on this, thanks.

## 2017-08-10 NOTE — Telephone Encounter (Signed)
That was supposed to say Shanda BumpsJessica from Hospice when I sent the message my computer froze. sorry

## 2017-08-11 NOTE — Telephone Encounter (Signed)
Per TP: take an extra demadex today.  If weight does not improve or develops increased SOB, wheezing/tightness, call the office or seek emergency care.  Thanks.

## 2017-08-11 NOTE — Telephone Encounter (Signed)
Called Fernando [name] from Hospice to advised her of TP response. Unable to reach left message to give us a call back.

## 2017-08-11 NOTE — Telephone Encounter (Signed)
Fernando [name] with Hospice is aware, nothing further needed.

## 2017-08-11 NOTE — Telephone Encounter (Signed)
Jessica from Hospice returning call. Cb is [phone].

## 2017-08-24 ENCOUNTER — Telehealth: Payer: Self-pay | Admitting: Adult Health

## 2017-08-24 NOTE — Telephone Encounter (Signed)
Called and left detailed message for Shanda BumpsJessica, RN, that pt's BIL edema may need to be addressed by his cardiologist, per pt's chart he has diastolic heart failure. Still advised her to call back to clarify.

## 2017-08-28 ENCOUNTER — Telehealth: Payer: Self-pay | Admitting: Adult Health

## 2017-08-28 NOTE — Telephone Encounter (Signed)
Wife also went ahead and gave pt an extra dose of torsemide on 03/28 - 1 20 mg dose in addition to his regular scheduled dosage and this seemed to help and wants to see if TP can give her okay for prn.  States pt does not have cardiologist.

## 2017-08-28 NOTE — Telephone Encounter (Signed)
Called Fernando [name], Hospice RN who stated to me pt's wife had some leftover supplies from [facility] when they did the wrap on pt's legs with the edema and took it upon herself to wrap his legs up using those supplies.  Fernando [name] also stated to me wife gave pt an extra dose of torsemide on 3/28 (1-20mg  dose) in addition with his regular scheduled dosage which seemed to help.  Per Fernando [name], she is wanting to know if TP can give a verbal okay for pt to have prn dosage. Fernando [name] is also wanting to know how often pt's legs should be wrapped (frequency, etc).  A previous message was documented on 08/24/17 when Fernando [name], hospice RN called stating pt had severe edema in legs and feet, which Fernando [name] stated to Fernando [name] that the bil edema might need to be addressed by cardiology per pt having the diastolic heart failure.  Based on our office being the attending practice for pt's hospice care, this is the reason Fernando [name] reached out to our office with this information.  Fernando [name],  Please advise on the above if you are okay giving a verbal order for a prn dose of torsemide for increased edema and also with regards to wrapping pt's legs.

## 2017-08-28 NOTE — Telephone Encounter (Signed)
Per TP: okay for verbal order for Torsemide 20mg  twice daily, may take an extra 20mg  tab daily if needed for leg swelling.    Med list updated Thanks

## 2017-08-28 NOTE — Telephone Encounter (Signed)
Spoke with Shanda BumpsJessica. She is aware of TP's recs. Nothing else needed at time of call.

## 2017-09-18 ENCOUNTER — Telehealth: Payer: Self-pay | Admitting: *Deleted

## 2017-09-18 NOTE — Telephone Encounter (Signed)
Im happy to assist with any cardiac issues he is having, but the pcp would need to continue to be the doctor of records. I would not be the primary physician managing a patients total needs in hospice. They are welcome to call us with any cardiac issues   Dina RichJonathan Branch MD

## 2017-09-18 NOTE — Telephone Encounter (Signed)
Shanda BumpsJessica made aware and voiced understanding

## 2017-09-18 NOTE — Telephone Encounter (Signed)
Shanda BumpsJessica, RN for [facility] [place] is requesting Dr Wyline MoodBranch be the physician of record for this pt - says they are having a hard time with primary care physician and the management of diuretics and cardiac medications and was requesting that Dr Wyline MoodBranch be the doctor of record.

## 2017-10-03 ENCOUNTER — Telehealth: Payer: Self-pay | Admitting: Adult Health

## 2017-10-03 NOTE — Telephone Encounter (Signed)
Per TP ok to give verbal. Verbal given nothing further needed.

## 2017-10-27 ENCOUNTER — Telehealth: Payer: Self-pay | Admitting: Adult Health

## 2017-10-27 NOTE — Telephone Encounter (Signed)
Attempted to call back Jessica with Hospice to clarify which medications are needed. No answer, message left to call back.

## 2017-10-30 NOTE — Telephone Encounter (Signed)
Called and left message for [place], Hospice to call back.

## 2017-10-31 MED ORDER — DOCUSATE SODIUM 50 MG PO CAPS: ORAL_CAPSULE | ORAL | 5 refills | Status: DC

## 2017-10-31 MED ORDER — METOPROLOL TARTRATE 50 MG PO TABS: 50.0000 mg | ORAL_TABLET | Freq: Two times a day (BID) | ORAL | 5 refills | Status: AC

## 2017-10-31 MED ORDER — GLIPIZIDE 5 MG PO TABS: 5.0000 mg | ORAL_TABLET | Freq: Two times a day (BID) | ORAL | 5 refills | Status: AC

## 2017-10-31 MED ORDER — DOCUSATE SODIUM 50 MG PO CAPS: 50.0000 mg | ORAL_CAPSULE | Freq: Two times a day (BID) | ORAL | 5 refills | Status: AC

## 2017-10-31 MED ORDER — TORSEMIDE 20 MG PO TABS: 20.0000 mg | ORAL_TABLET | Freq: Two times a day (BID) | ORAL | 5 refills | Status: DC

## 2017-10-31 MED ORDER — TERAZOSIN HCL 5 MG PO CAPS: 5.0000 mg | ORAL_CAPSULE | Freq: Every day | ORAL | 5 refills | Status: AC

## 2017-10-31 NOTE — Telephone Encounter (Signed)
That is fine did not know I was hospice attending but im ok with that

## 2017-10-31 NOTE — Telephone Encounter (Signed)
Spoke with Fernando [name], she needs Rx's prescriptions need to be re-written. She states Fernando [name] is the attending and it needs to come from us. She would like these medications sent to the pharmacy, [facility] on [address]. Fernando [name] states his medications should no l;onger go through the [facility]. Please advise, I want to make sure Fernando [name] agreed to be Attending provider and that she is ok with me sending these.   Glipizide 5 mg bid Terazosin 5 mg take one every evening Torsemide 40 mg in the am 40 mg in pm  Metoprolol Tartrate 50mg  bid Docusate 100mg  take 1 tablet twice daily

## 2017-10-31 NOTE — Telephone Encounter (Signed)
Spoke with Shanda BumpsJessica with Hospice. She is aware that these prescriptions have been sent in. Nothing further was needed.

## 2018-01-09 ENCOUNTER — Telehealth: Payer: Self-pay | Admitting: Adult Health

## 2018-01-09 NOTE — Telephone Encounter (Signed)
Attempted to call either Fayrene FearingJames or [place] with Hospice but unable to reach them.  Left a message for them to return call x1

## 2018-01-11 NOTE — Telephone Encounter (Signed)
Attempted to call Fayrene FearingJames or [place] with Hospice  [phone]. I did not receive an answer at time of call. I have left a voicemail message for pt to return call. X2

## 2018-01-15 NOTE — Telephone Encounter (Signed)
Called Hospice number given-belongs to a [place] [facility]-left message asking for him or Shanda BumpsJessica to contact our office regarding patient and what verbal order is needed.

## 2018-01-16 NOTE — Telephone Encounter (Signed)
Attempted to call Shanda BumpsJessica or Fayrene FearingJames with Hospice today at phone [phone]. I did not receive an answer at time of call. I have left a voicemail message for pt to return call. X1  Per triage protocol,  This message will be closed and encounter signed today due to we have tried to call patient three time, no response. When patient returns call, a new message will be open to address patient's concerns at that time of call. Nothing further needed at this time of call.

## 2018-01-18 ENCOUNTER — Telehealth: Payer: Self-pay | Admitting: Adult Health

## 2018-01-18 MED ORDER — PREDNISONE 20 MG PO TABS: 20.0000 mg | ORAL_TABLET | Freq: Every day | ORAL | 0 refills | Status: AC

## 2018-01-18 NOTE — Telephone Encounter (Signed)
Spoke with Shanda BumpsJessica with Hospice. States that pt is having knee pain which is related to gout. Hopsice MD states that he needs to have an anti-inflammatory. He wanted Shanda BumpsJessica to reach out to Tammy to have her address this. Pt is currently on Allopurinol.  TP - please advise. Thanks.

## 2018-01-18 NOTE — Telephone Encounter (Signed)
Would use prednisone (colchicine may cause diarrhea) short term .  Prednisone 20mg  daily for 5 days and stop. -take with food

## 2018-01-18 NOTE — Telephone Encounter (Signed)
Called and spoke to Fernando [name] with Hospice and advised her of NP's recommendations. She [facility] [address].  Rx sent to pharmacy of choice. Nothing further needed at this time.

## 2018-01-30 ENCOUNTER — Telehealth: Payer: Self-pay | Admitting: Adult Health

## 2018-01-30 NOTE — Telephone Encounter (Signed)
Per TP: yes please reach out to the hospice physician >> would like to defer non-pulmonary issues to the hospice physician.  Thank you

## 2018-01-30 NOTE — Telephone Encounter (Signed)
Per TP: does he have a PCP that can handle this?  Would prefer we handle his pulmonary issues.  Thank you.

## 2018-01-30 NOTE — Telephone Encounter (Signed)
Attempted to call Fernando [name] with Hospice, no answer, left voicemail to call back.

## 2018-01-30 NOTE — Telephone Encounter (Signed)
Called and spoke to [place] with hospice. Pt is still experiencing discomfort in right knee from gout.   Shanda Bumps states pt is currently taking allopurinol 100mg  daily, and recently completed course of prednisone 20mg  x5 with no relief. Prednisone was prescribed by TP on 01/18/18. Pt is also currently taking tylenol once daily with no relief. Shanda Bumps recommended pt taking tylenol bid.  Shanda Bumps is requesting additional recommendations.  Pt is not currently experiencing any other symptoms per Shanda Bumps.  TP please advise. Thanks

## 2018-01-30 NOTE — Telephone Encounter (Signed)
Patient is currently under hospice care and [facility] is listed at the attending and has been handling care.  If this is an issue they can reach out to a hospice physician.   TP please advise.

## 2018-01-31 NOTE — Telephone Encounter (Signed)
called and spoke to [place] with Hospice and relayed below message from TP. Nothing further is needed.

## 2018-02-28 ENCOUNTER — Telehealth: Payer: Self-pay | Admitting: Adult Health

## 2018-02-28 NOTE — Telephone Encounter (Signed)
Spoke with Fernando [name] at hospice they just needed a verbal order to continue services. Patient needed some supplies but they were not able to renew until they received verbal order. Order given. Nothing further needed at this time.

## 2018-02-28 NOTE — Telephone Encounter (Signed)
Attempted to call Shanda Bumps with [facility]. I did not receive an answer. I have left a message for Shanda Bumps to return our call.

## 2018-05-03 LAB — BLOOD GAS, ARTERIAL
ACID-BASE EXCESS: 10.6 mmol/L — AB (ref 0.0–2.0)
Bicarbonate: 39 mmol/L — ABNORMAL HIGH (ref 20.0–28.0)
Delivery systems: POSITIVE
Drawn by: 51425
EXPIRATORY PAP: 6
FIO2: 0.5
Inspiratory PAP: 14
LHR: 14 {breaths}/min
O2 SAT: 97.1 %
PH ART: 7.308 — AB (ref 7.350–7.450)
Patient temperature: 98.6
pCO2 arterial: 80.1 mmHg (ref 32.0–48.0)
pO2, Arterial: 107 mmHg (ref 83.0–108.0)

## 2018-05-08 ENCOUNTER — Telehealth: Payer: Self-pay | Admitting: Adult Health

## 2018-05-08 NOTE — Telephone Encounter (Signed)
Called and spoke with Jesscia with hospice. She says she just needs a verbal stating she can continue care for patient. Medicare and Medicaid require it. After this patient will be set for 3 months.  Tammy may I put an order in to continue care with hospice

## 2018-05-08 NOTE — Telephone Encounter (Signed)
Called Shanda BumpsJessica and gave the verbal order [facility].  Shanda BumpsJessica read back order.  Nothing further needed

## 2018-05-08 NOTE — Telephone Encounter (Signed)
Per TP: okay to continue Hospice care.  Thank you.

## 2018-05-09 ENCOUNTER — Telehealth: Payer: Self-pay | Admitting: Adult Health

## 2018-05-09 NOTE — Telephone Encounter (Signed)
[facility]  CMM Key: [id number]   PA request has been sent to plan, and a determination is expected within 5 business days.   Routing to [place] for follow-up.

## 2018-05-10 NOTE — Telephone Encounter (Signed)
Spoke with Fernando [name] at [employer] and she stated the pt would have to try and fail the medication listed below before they would cover the non-formulary drug Torsemide. TP did you want to send in RX?  (Bumex() Bumetanide

## 2018-05-10 NOTE — Telephone Encounter (Signed)
Per [place], [place], the [place], pt needs to try two other meds. Cb is [phone]

## 2018-05-11 NOTE — Telephone Encounter (Signed)
Jessica with [facility]osp[facility] called back-medication is covered and states she will take care of this at the pharmacy. Nothing more needed at this time.

## 2018-05-11 NOTE — Telephone Encounter (Signed)
Patient is on [facility] - is this not covered under that service?  LMOM TCB x1 with Shanda BumpsJessica w/ Hospice @ [phone] to discuss

## 2018-06-05 ENCOUNTER — Other Ambulatory Visit: Payer: Self-pay | Admitting: Adult Health

## 2018-06-15 ENCOUNTER — Telehealth: Payer: Self-pay | Admitting: Adult Health

## 2018-06-15 NOTE — Telephone Encounter (Signed)
Per JJ this has been sent back to the requested fax and signed. Called [facility] to make sure aware. Unable to reach. Left message to give [place] a call back.

## 2018-06-18 NOTE — Telephone Encounter (Signed)
Noted  

## 2018-06-18 NOTE — Telephone Encounter (Signed)
Fernando [name] is returning call. Per Whitney, [facility]osp[facility] has received the signed orders from [facility]. Cb is [phone].

## 2018-06-18 NOTE — Telephone Encounter (Signed)
LMTCB x2 for Fernando [name] with [facility].

## 2018-06-25 ENCOUNTER — Telehealth: Payer: Self-pay | Admitting: Adult Health

## 2018-06-25 IMAGING — CR DG CHEST 2V
2 series · 2 of 2 positions shown · non-contrast
Comparison: 01/24/2017

CLINICAL DATA: Altered mental status.  Hypoxia.

EXAM:
CHEST  2 VIEW

[w chest lat]
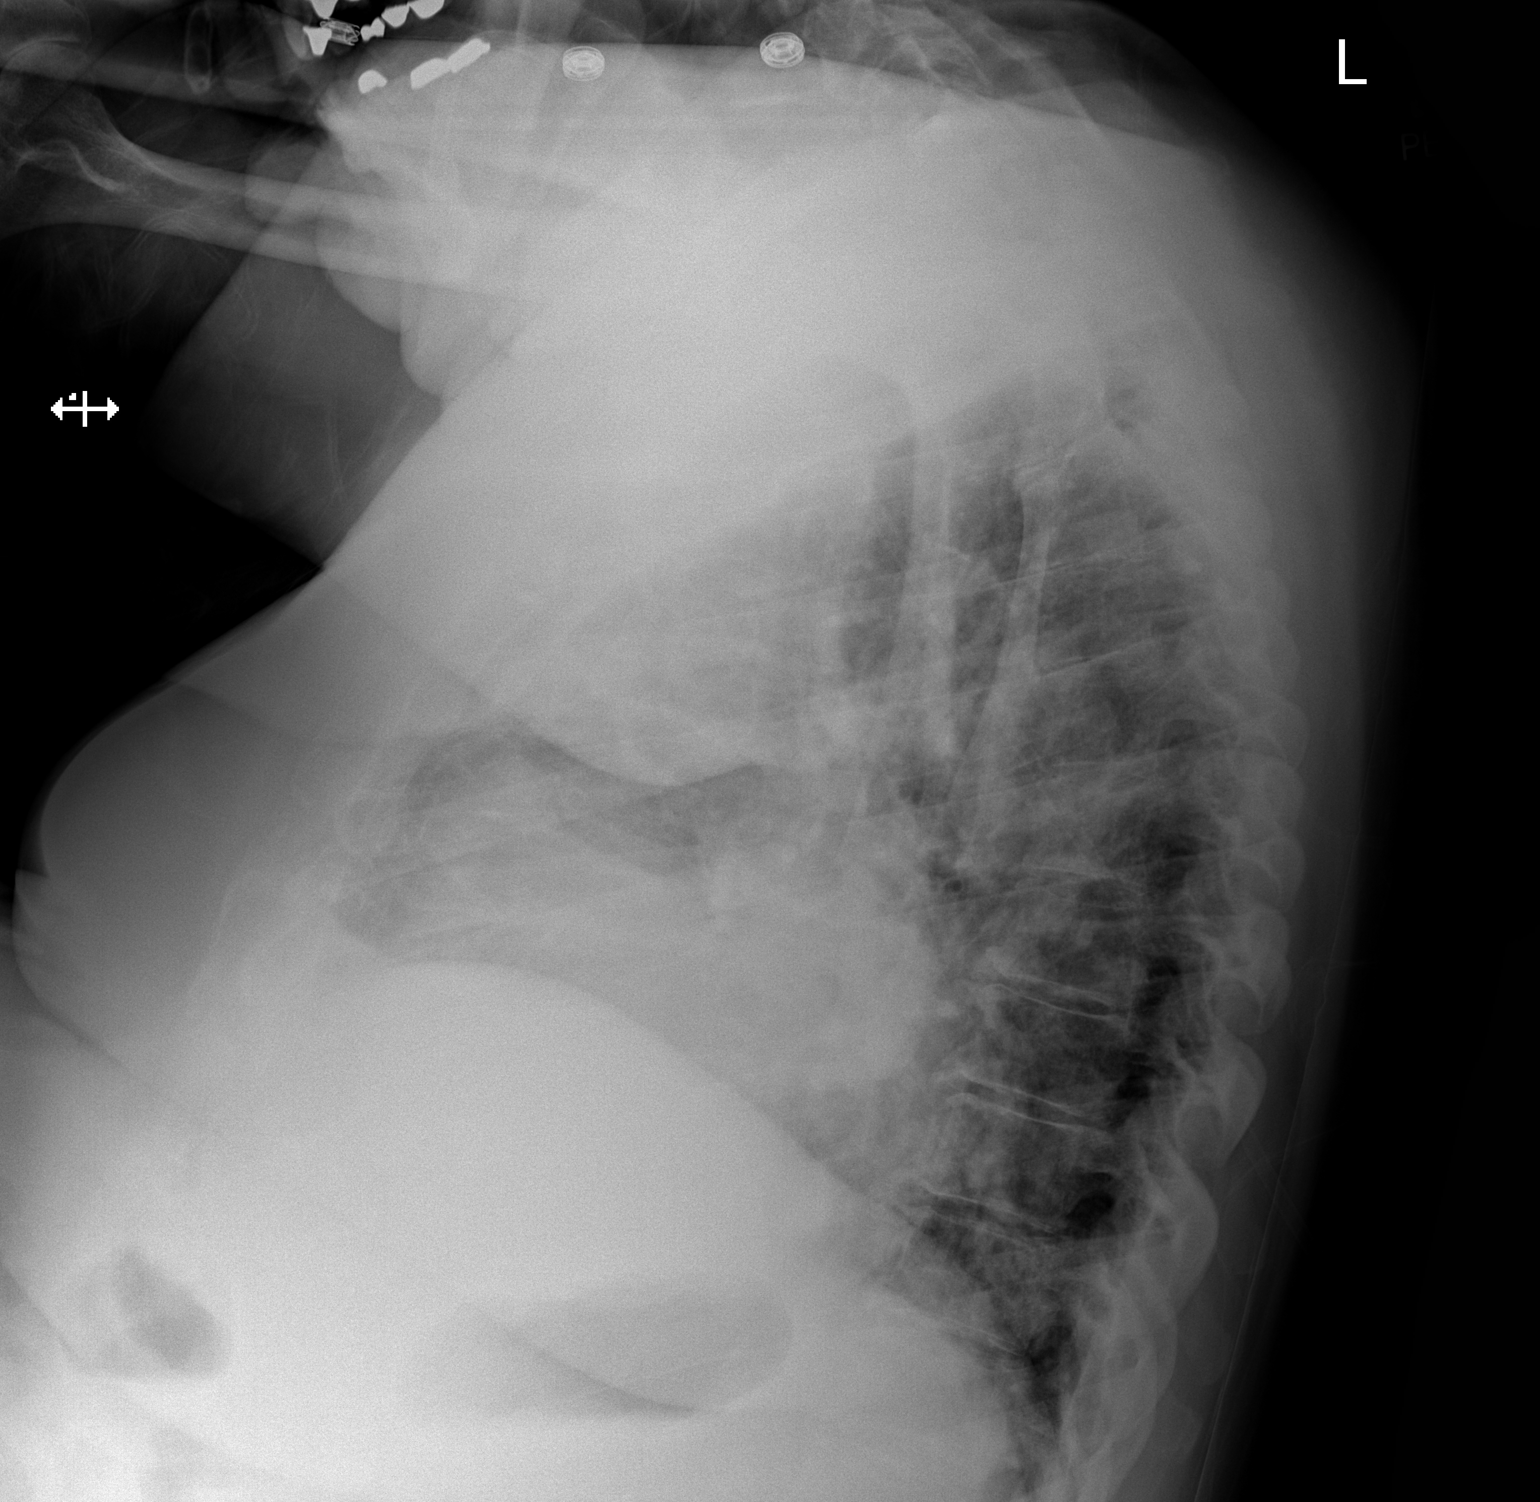

[x chest ap]
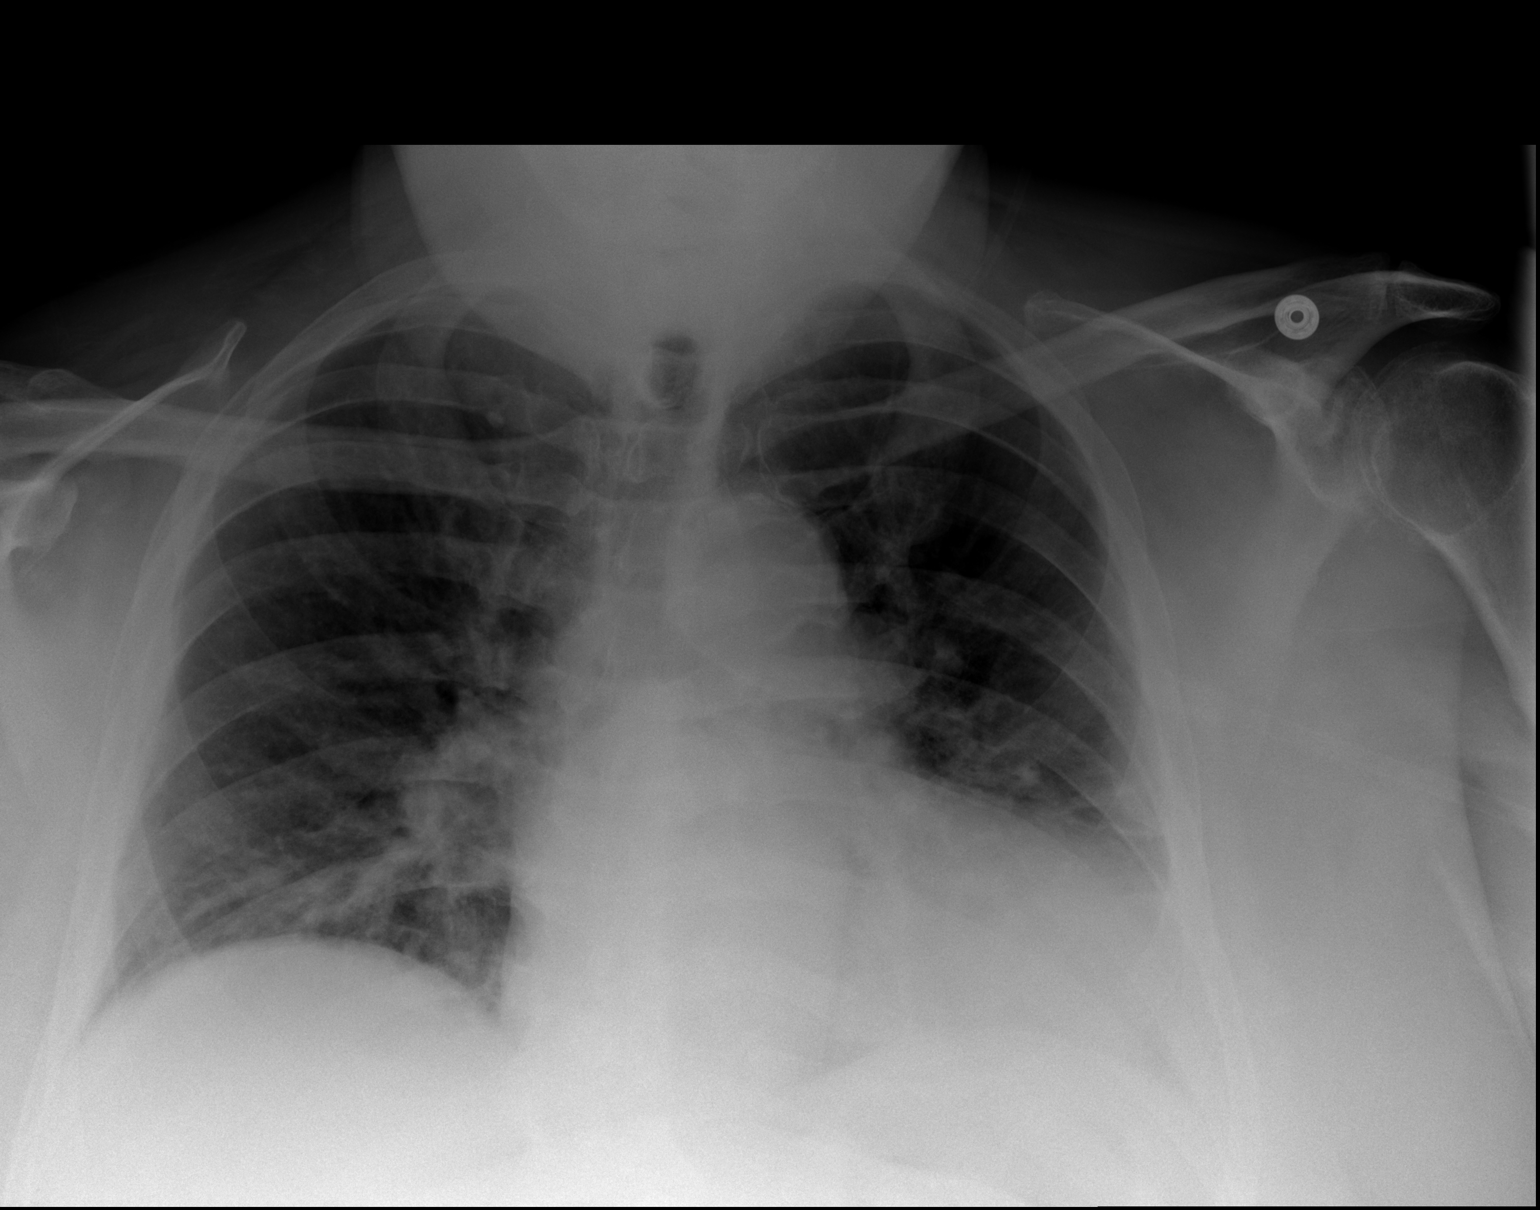

[2 of 2 positions shown; findings below may reference images not displayed]

FINDINGS: Low lung volumes. Cardiomegaly. Bibasilar scarring or subsegmental
atelectasis. No definite consolidation or edema. No pneumothorax.
Bones grossly unremarkable.
IMPRESSION: Low lung volumes. No definite active infiltrates or failure.
Cardiomegaly.

## 2018-06-25 NOTE — Telephone Encounter (Signed)
LMOM TCB x1 for [facility] w/ [facility] signed orders last week and they were faxed back, are these the same orders?

## 2018-06-26 NOTE — Telephone Encounter (Signed)
Detailed message has been left with Joyce Gross giving verbal orders to continue [facility]ospice Care. Nothing further was needed at this time.

## 2018-06-26 NOTE — Telephone Encounter (Signed)
Joyce Gross states that the verbal orders are to continue [facility]osp[facility] Care. The faxed orders were for care orders. Per Joyce Gross, its ok to leave verbal orders on voicemail if there is no answer. Cb is [phone]

## 2018-06-27 IMAGING — DX DG CHEST 1V PORT
1 series · 1 of 1 positions shown · non-contrast
Comparison: 06/30/2017; 01/24/2017

CLINICAL DATA: Fever

EXAM:
PORTABLE CHEST 1 VIEW

[chest ap]
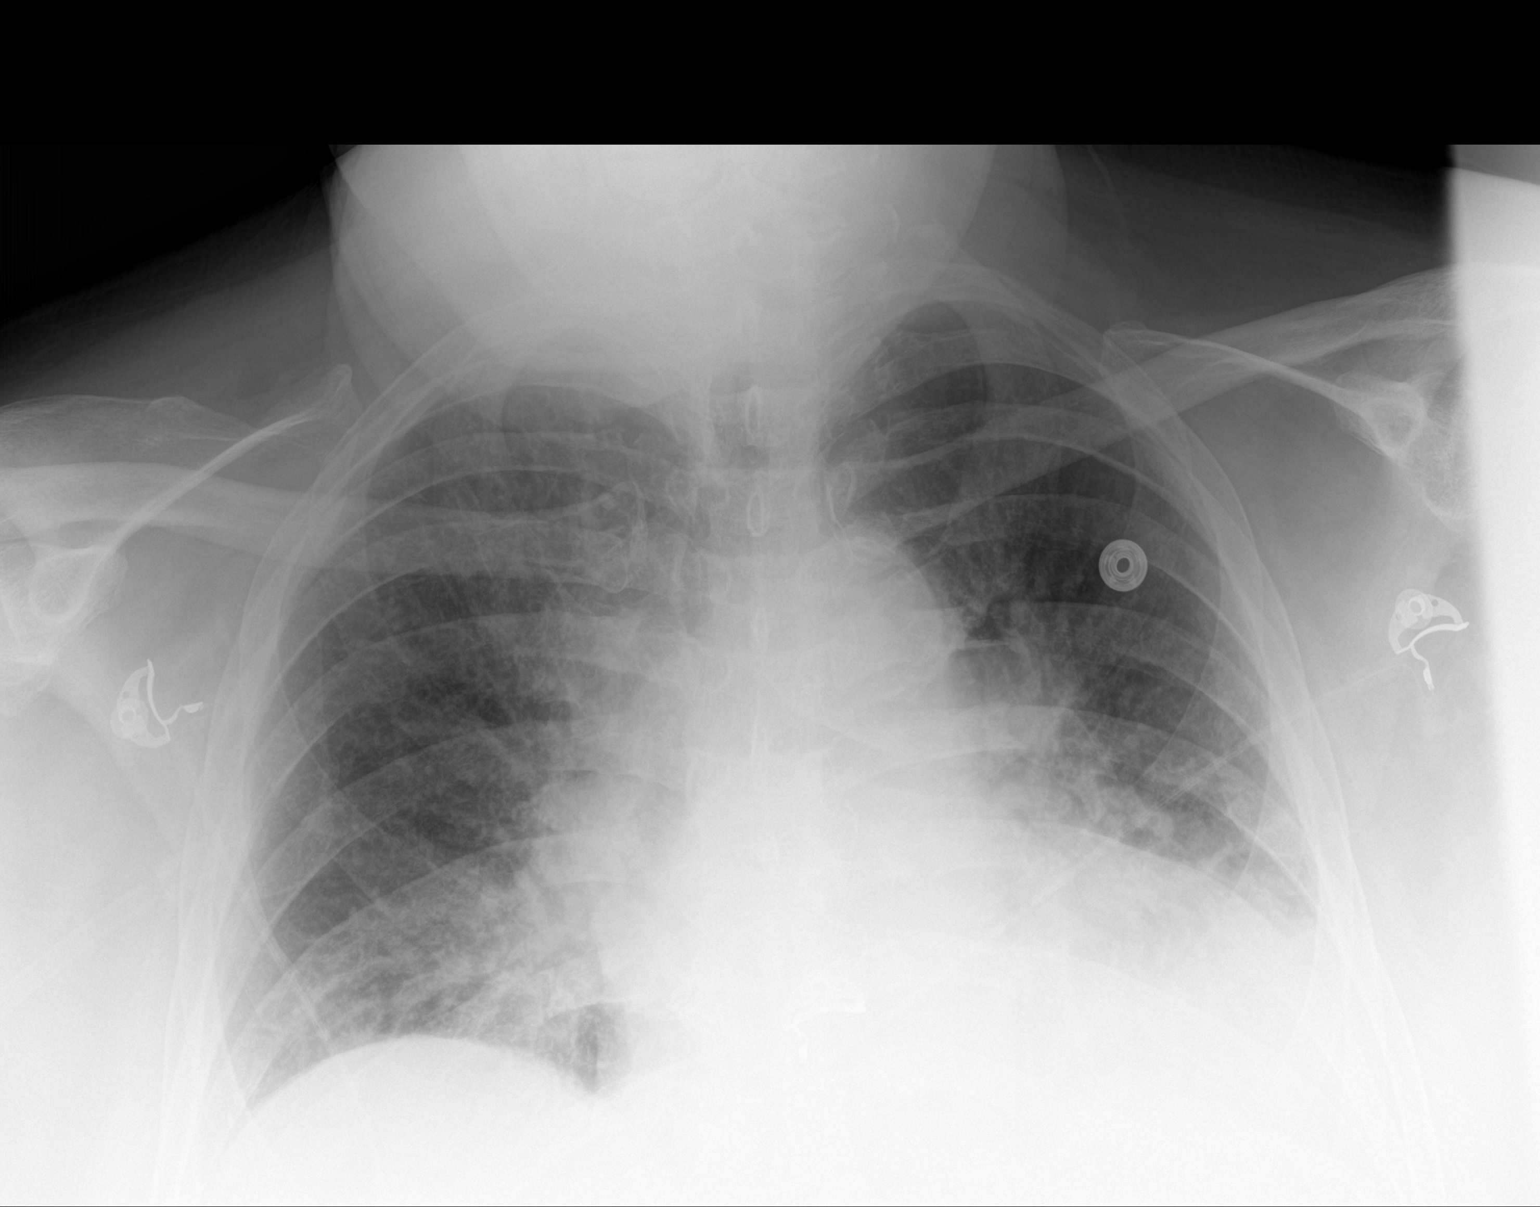

[1 of 1 positions shown; findings below may reference images not displayed]

FINDINGS: Grossly unchanged enlarged cardiac silhouette and mediastinal
contours given persistently reduced lung volumes. Worsening
bibasilar heterogeneous/consolidative opacities, left greater than
right. Trace left-sided effusion is not excluded. Pulmonary
vasculature appears less distinct than present examination with
cephalization of flow. No definite pneumothorax. No acute osseus
abnormalities.
IMPRESSION: 1. Worsening bibasilar heterogeneous/consolidative opacities, left
greater than right, atelectasis versus infiltrate. Further
evaluation with a PA and lateral chest radiograph may be obtained as
clinically indicated.
2. Suspected mild pulmonary venous congestion with potential
development of a trace left-sided effusion.

## 2018-07-27 IMAGING — DX DG CHEST 2V
2 series · 2 of 2 positions shown · non-contrast
Comparison: 07/18/2017

CLINICAL DATA: Follow-up pneumonia

EXAM:
CHEST  2 VIEW

[chest pa]
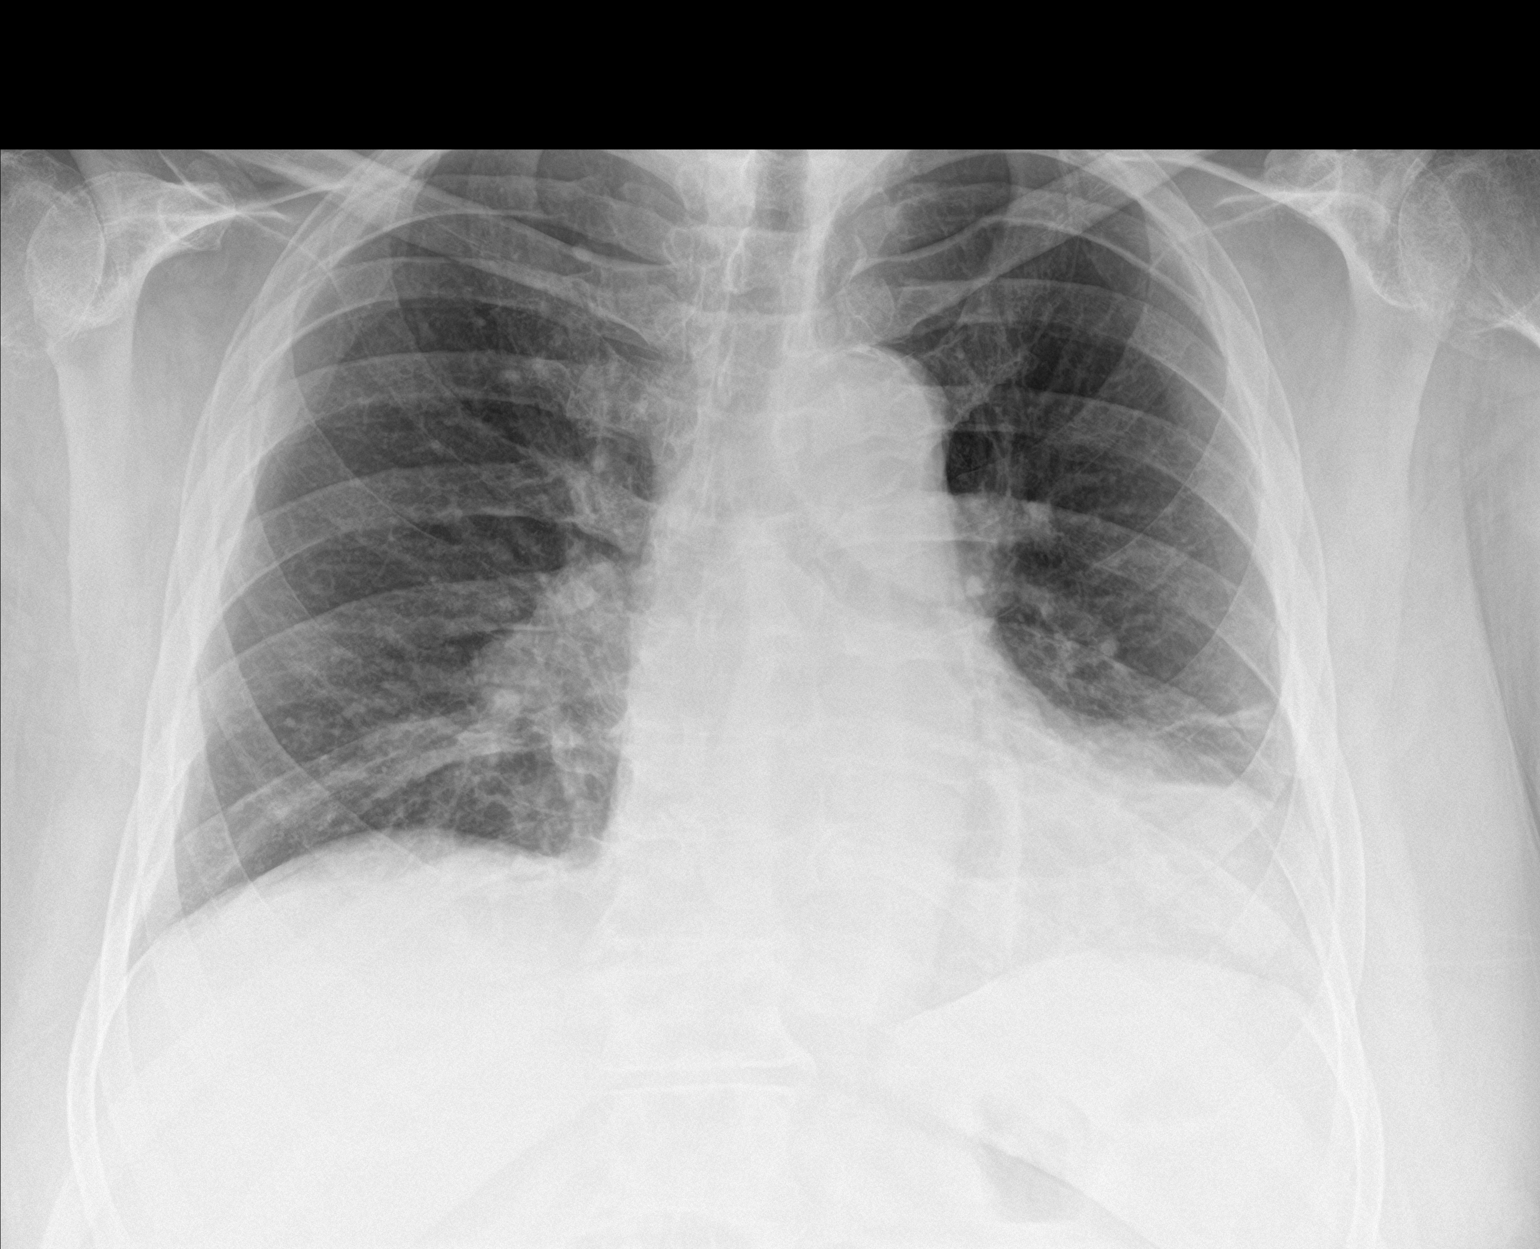

[chest lat]
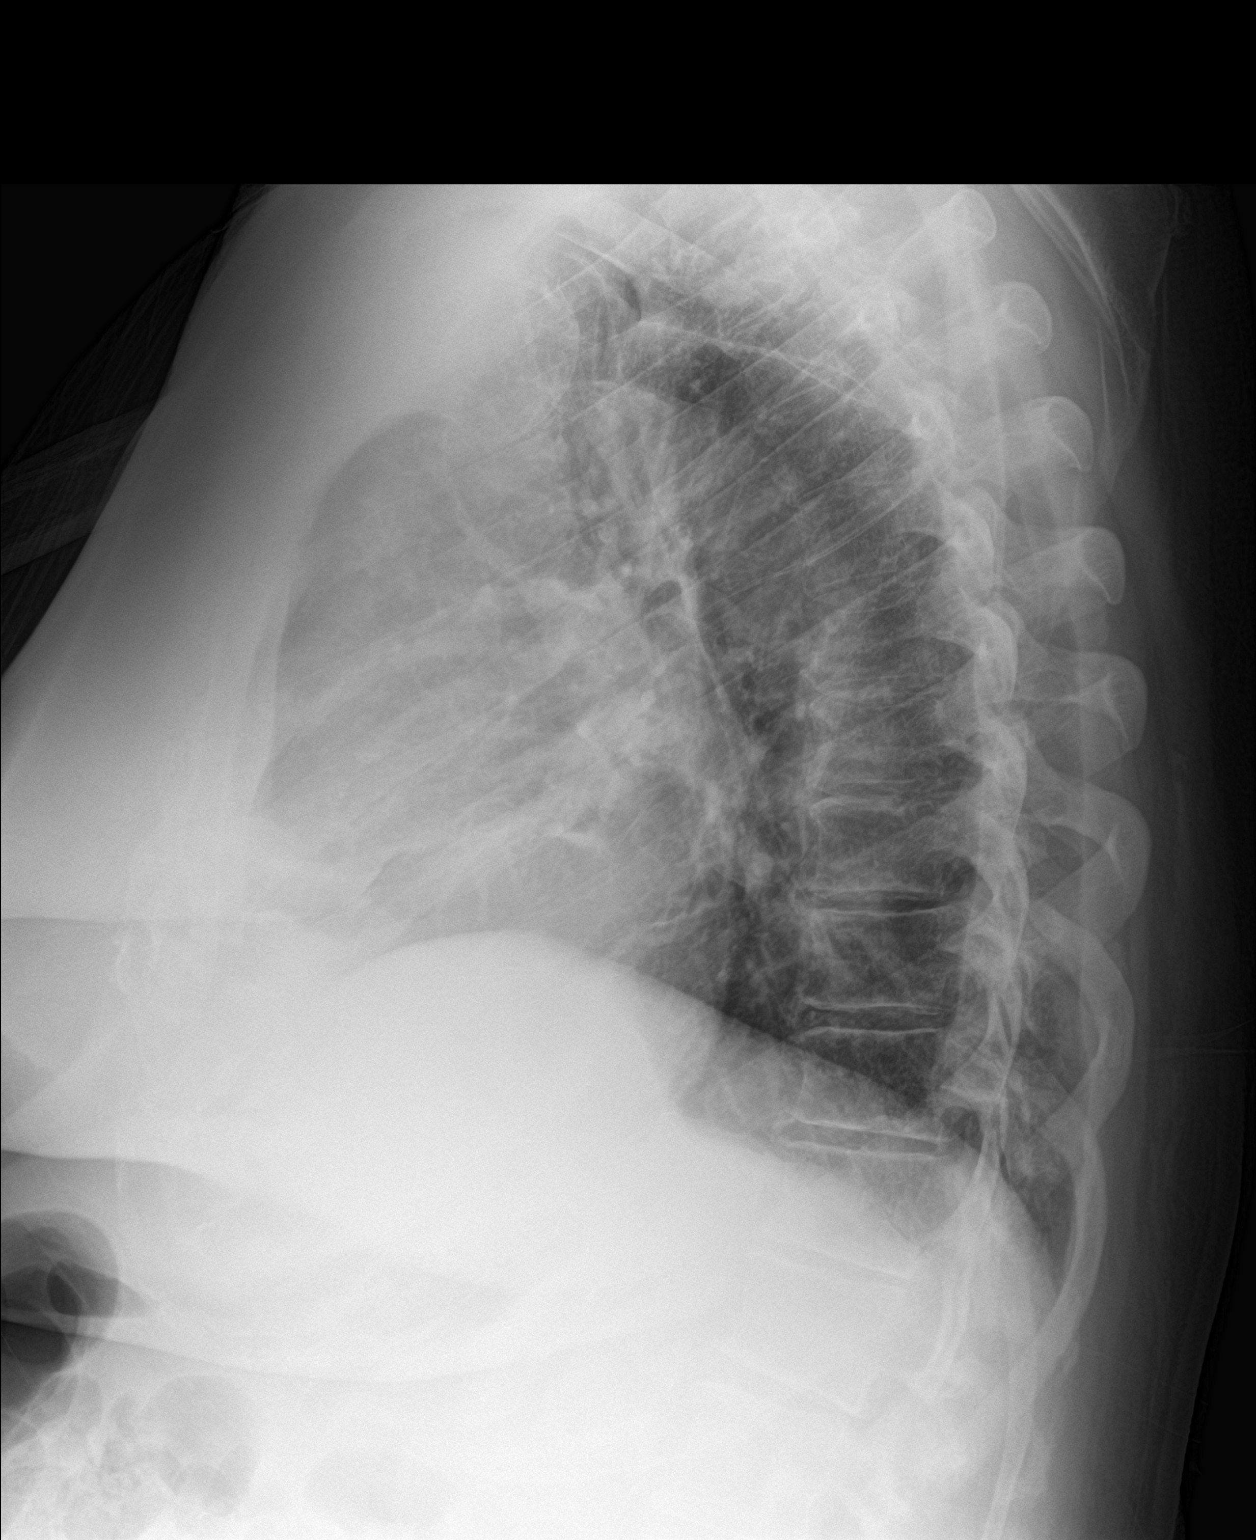

[2 of 2 positions shown; findings below may reference images not displayed]

FINDINGS: Chronic cardiomegaly. Full appearance of the right hilum that is
stable over multiple studies and attributed to vessels. There is
linear opacities at the left base that are unchanged. Chronic low
lung volumes. No edema, effusion, or pneumothorax. No acute osseous
finding.
IMPRESSION: Unchanged linear opacities at the left base compatible with
atelectasis, scarring, or bronchopneumonia. Given these developed
recently, additional follow-up in 6-8 weeks is recommended.

## 2018-09-11 ENCOUNTER — Telehealth: Payer: Self-pay | Admitting: Adult Health

## 2018-09-11 NOTE — Telephone Encounter (Signed)
Received hospice forms for signature.  Left for T.Parrett, NP to sign.  Will follow up

## 2018-09-11 NOTE — Telephone Encounter (Signed)
Contacted Fernando [name] by phone.  Informed her I'm unable to locate hospice forms that need T. Parrett's signature. Requested she fax the forms to [phone] in triage so I can take care of this request. Will await fax.

## 2018-09-12 NOTE — Telephone Encounter (Signed)
Forms have been signed. I will fax back to Hospice. Will close encounter.

## 2018-09-12 NOTE — Telephone Encounter (Signed)
Tammy, please advise if you have signed these forms yet, thanks!

## 2018-10-11 ENCOUNTER — Other Ambulatory Visit: Payer: Self-pay | Admitting: Physician Assistant

## 2019-07-29 DEATH — deceased
